# Patient Record
Sex: Female | Born: 1955
Health system: Southern US, Community
[De-identification: ages and names within clinical notes are randomized; demographics above are authoritative.]

## PROBLEM LIST (undated history)

## (undated) DIAGNOSIS — F609 Personality disorder, unspecified: Secondary | ICD-10-CM

## (undated) DIAGNOSIS — G43909 Migraine, unspecified, not intractable, without status migrainosus: Secondary | ICD-10-CM

## (undated) DIAGNOSIS — G5602 Carpal tunnel syndrome, left upper limb: Secondary | ICD-10-CM

## (undated) DIAGNOSIS — R0600 Dyspnea, unspecified: Secondary | ICD-10-CM

## (undated) DIAGNOSIS — G5622 Lesion of ulnar nerve, left upper limb: Secondary | ICD-10-CM

## (undated) DIAGNOSIS — K219 Gastro-esophageal reflux disease without esophagitis: Secondary | ICD-10-CM

## (undated) DIAGNOSIS — M1990 Unspecified osteoarthritis, unspecified site: Secondary | ICD-10-CM

## (undated) DIAGNOSIS — F909 Attention-deficit hyperactivity disorder, unspecified type: Secondary | ICD-10-CM

## (undated) DIAGNOSIS — Z9151 Personal history of suicidal behavior: Secondary | ICD-10-CM

## (undated) DIAGNOSIS — Z87442 Personal history of urinary calculi: Secondary | ICD-10-CM

## (undated) DIAGNOSIS — E538 Deficiency of other specified B group vitamins: Secondary | ICD-10-CM

## (undated) DIAGNOSIS — F419 Anxiety disorder, unspecified: Secondary | ICD-10-CM

## (undated) DIAGNOSIS — T4145XA Adverse effect of unspecified anesthetic, initial encounter: Secondary | ICD-10-CM

## (undated) DIAGNOSIS — F32A Depression, unspecified: Secondary | ICD-10-CM

## (undated) DIAGNOSIS — N390 Urinary tract infection, site not specified: Secondary | ICD-10-CM

## (undated) DIAGNOSIS — S42209A Unspecified fracture of upper end of unspecified humerus, initial encounter for closed fracture: Secondary | ICD-10-CM

## (undated) DIAGNOSIS — I959 Hypotension, unspecified: Secondary | ICD-10-CM

## (undated) DIAGNOSIS — F329 Major depressive disorder, single episode, unspecified: Secondary | ICD-10-CM

## (undated) DIAGNOSIS — G5793 Unspecified mononeuropathy of bilateral lower limbs: Secondary | ICD-10-CM

## (undated) DIAGNOSIS — Z915 Personal history of self-harm: Secondary | ICD-10-CM

## (undated) DIAGNOSIS — T8859XA Other complications of anesthesia, initial encounter: Secondary | ICD-10-CM

## (undated) DIAGNOSIS — M62421 Contracture of muscle, right upper arm: Secondary | ICD-10-CM

## (undated) HISTORY — PX: EYE SURGERY: SHX253

## (undated) HISTORY — PX: LUMBAR DISC SURGERY: SHX700

## (undated) HISTORY — PX: BACK SURGERY: SHX140

## (undated) HISTORY — PX: TONSILLECTOMY: SUR1361

## (undated) HISTORY — DX: Deficiency of other specified B group vitamins: E53.8

---

## 1987-12-25 HISTORY — PX: OTHER SURGICAL HISTORY: SHX169

## 1999-04-28 ENCOUNTER — Other Ambulatory Visit: Admission: RE | Admit: 1999-04-28 | Discharge: 1999-04-28 | Payer: Self-pay | Admitting: *Deleted

## 1999-06-08 ENCOUNTER — Encounter: Payer: Self-pay | Admitting: Orthopedic Surgery

## 1999-06-08 ENCOUNTER — Ambulatory Visit (HOSPITAL_COMMUNITY): Admission: RE | Admit: 1999-06-08 | Discharge: 1999-06-08 | Payer: Self-pay | Admitting: Orthopedic Surgery

## 1999-06-13 ENCOUNTER — Ambulatory Visit (HOSPITAL_BASED_OUTPATIENT_CLINIC_OR_DEPARTMENT_OTHER): Admission: RE | Admit: 1999-06-13 | Discharge: 1999-06-13 | Payer: Self-pay | Admitting: Orthopedic Surgery

## 1999-06-21 ENCOUNTER — Ambulatory Visit (HOSPITAL_COMMUNITY): Admission: RE | Admit: 1999-06-21 | Discharge: 1999-06-21 | Payer: Self-pay | Admitting: Orthopedic Surgery

## 1999-09-20 ENCOUNTER — Other Ambulatory Visit: Admission: RE | Admit: 1999-09-20 | Discharge: 1999-09-20 | Payer: Self-pay | Admitting: *Deleted

## 2000-02-05 ENCOUNTER — Ambulatory Visit (HOSPITAL_COMMUNITY): Admission: RE | Admit: 2000-02-05 | Discharge: 2000-02-05 | Payer: Self-pay | Admitting: Orthopedic Surgery

## 2000-02-05 ENCOUNTER — Encounter: Payer: Self-pay | Admitting: Orthopedic Surgery

## 2000-09-11 ENCOUNTER — Encounter: Admission: RE | Admit: 2000-09-11 | Discharge: 2000-09-11 | Payer: Self-pay | Admitting: *Deleted

## 2000-09-11 ENCOUNTER — Other Ambulatory Visit: Admission: RE | Admit: 2000-09-11 | Discharge: 2000-09-11 | Payer: Self-pay | Admitting: *Deleted

## 2000-09-11 ENCOUNTER — Encounter: Payer: Self-pay | Admitting: *Deleted

## 2000-10-01 ENCOUNTER — Other Ambulatory Visit: Admission: RE | Admit: 2000-10-01 | Discharge: 2000-10-01 | Payer: Self-pay | Admitting: *Deleted

## 2002-02-17 ENCOUNTER — Other Ambulatory Visit: Admission: RE | Admit: 2002-02-17 | Discharge: 2002-02-17 | Payer: Self-pay | Admitting: *Deleted

## 2002-10-28 ENCOUNTER — Encounter: Admission: RE | Admit: 2002-10-28 | Discharge: 2002-10-28 | Payer: Self-pay | Admitting: Family Medicine

## 2002-10-28 ENCOUNTER — Encounter: Payer: Self-pay | Admitting: Family Medicine

## 2004-04-10 ENCOUNTER — Other Ambulatory Visit: Admission: RE | Admit: 2004-04-10 | Discharge: 2004-04-10 | Payer: Self-pay | Admitting: Family Medicine

## 2004-04-25 ENCOUNTER — Encounter: Admission: RE | Admit: 2004-04-25 | Discharge: 2004-04-25 | Payer: Self-pay | Admitting: Family Medicine

## 2004-08-07 ENCOUNTER — Other Ambulatory Visit: Admission: RE | Admit: 2004-08-07 | Discharge: 2004-08-07 | Payer: Self-pay | Admitting: Family Medicine

## 2005-02-09 ENCOUNTER — Other Ambulatory Visit: Admission: RE | Admit: 2005-02-09 | Discharge: 2005-02-09 | Payer: Self-pay | Admitting: Family Medicine

## 2005-06-06 ENCOUNTER — Encounter: Admission: RE | Admit: 2005-06-06 | Discharge: 2005-06-06 | Payer: Self-pay | Admitting: Family Medicine

## 2005-08-08 ENCOUNTER — Other Ambulatory Visit: Admission: RE | Admit: 2005-08-08 | Discharge: 2005-08-08 | Payer: Self-pay | Admitting: Family Medicine

## 2006-08-05 ENCOUNTER — Other Ambulatory Visit: Admission: RE | Admit: 2006-08-05 | Discharge: 2006-08-05 | Payer: Self-pay | Admitting: Family Medicine

## 2006-09-09 ENCOUNTER — Encounter: Admission: RE | Admit: 2006-09-09 | Discharge: 2006-09-09 | Payer: Self-pay | Admitting: Family Medicine

## 2007-09-05 ENCOUNTER — Other Ambulatory Visit: Admission: RE | Admit: 2007-09-05 | Discharge: 2007-09-05 | Payer: Self-pay | Admitting: Family Medicine

## 2007-09-12 ENCOUNTER — Encounter: Admission: RE | Admit: 2007-09-12 | Discharge: 2007-09-12 | Payer: Self-pay | Admitting: Family Medicine

## 2008-03-12 ENCOUNTER — Other Ambulatory Visit: Admission: RE | Admit: 2008-03-12 | Discharge: 2008-03-12 | Payer: Self-pay | Admitting: Family Medicine

## 2008-09-30 ENCOUNTER — Other Ambulatory Visit: Admission: RE | Admit: 2008-09-30 | Discharge: 2008-09-30 | Payer: Self-pay | Admitting: Obstetrics and Gynecology

## 2008-10-06 ENCOUNTER — Encounter: Admission: RE | Admit: 2008-10-06 | Discharge: 2008-10-06 | Payer: Self-pay | Admitting: Obstetrics and Gynecology

## 2008-12-01 ENCOUNTER — Ambulatory Visit (HOSPITAL_COMMUNITY): Admission: RE | Admit: 2008-12-01 | Discharge: 2008-12-01 | Payer: Self-pay | Admitting: Obstetrics and Gynecology

## 2008-12-01 ENCOUNTER — Encounter (INDEPENDENT_AMBULATORY_CARE_PROVIDER_SITE_OTHER): Payer: Self-pay | Admitting: Obstetrics and Gynecology

## 2008-12-01 HISTORY — PX: CERVICAL BIOPSY  W/ LOOP ELECTRODE EXCISION: SUR135

## 2009-07-13 ENCOUNTER — Other Ambulatory Visit: Admission: RE | Admit: 2009-07-13 | Discharge: 2009-07-13 | Payer: Self-pay | Admitting: Obstetrics and Gynecology

## 2009-10-14 ENCOUNTER — Encounter: Admission: RE | Admit: 2009-10-14 | Discharge: 2009-10-14 | Payer: Self-pay | Admitting: Family Medicine

## 2009-10-31 ENCOUNTER — Ambulatory Visit (HOSPITAL_BASED_OUTPATIENT_CLINIC_OR_DEPARTMENT_OTHER): Admission: RE | Admit: 2009-10-31 | Discharge: 2009-10-31 | Payer: Self-pay | Admitting: General Surgery

## 2009-10-31 ENCOUNTER — Encounter (INDEPENDENT_AMBULATORY_CARE_PROVIDER_SITE_OTHER): Payer: Self-pay | Admitting: General Surgery

## 2009-10-31 HISTORY — PX: FEMORAL HERNIA REPAIR: SUR1179

## 2010-01-25 ENCOUNTER — Other Ambulatory Visit: Admission: RE | Admit: 2010-01-25 | Discharge: 2010-01-25 | Payer: Self-pay | Admitting: Obstetrics and Gynecology

## 2010-08-29 ENCOUNTER — Other Ambulatory Visit: Admission: RE | Admit: 2010-08-29 | Discharge: 2010-08-29 | Payer: Self-pay | Admitting: Obstetrics and Gynecology

## 2011-01-10 ENCOUNTER — Emergency Department (HOSPITAL_COMMUNITY)
Admission: EM | Admit: 2011-01-10 | Discharge: 2011-01-11 | Disposition: A | Discharge status: Other Acute Inpatient Hospital | Payer: Self-pay | Source: Home / Self Care | Admitting: Emergency Medicine

## 2011-01-11 ENCOUNTER — Inpatient Hospital Stay (HOSPITAL_COMMUNITY)
Admission: AD | Admit: 2011-01-11 | Discharge: 2011-01-16 | Payer: Self-pay | Attending: Psychiatry | Admitting: Psychiatry

## 2011-01-15 LAB — COMPREHENSIVE METABOLIC PANEL
AST: 21 U/L (ref 0–37)
Albumin: 4.4 g/dL (ref 3.5–5.2)
Alkaline Phosphatase: 99 U/L (ref 39–117)
Calcium: 9.5 mg/dL (ref 8.4–10.5)
Chloride: 110 mEq/L (ref 96–112)
GFR calc Af Amer: >60 mL/min (ref >60)
Potassium: 3.5 mEq/L (ref 3.5–5.1)
Total Bilirubin: 0.6 mg/dL (ref 0.3–1.2)
Total Protein: 7.5 g/dL (ref 6.0–8.3)

## 2011-01-15 LAB — CBC
Hemoglobin: 14.7 g/dL (ref 12.0–15.0)
MCH: 31.4 pg (ref 26.0–34.0)
RBC: 4.68 MIL/uL (ref 3.87–5.11)

## 2011-01-15 LAB — DIFFERENTIAL
Basophils Absolute: 0 10*3/uL (ref 0.0–0.1)
Basophils Relative: 1 % (ref 0–1)
Eosinophils Relative: 3 % (ref 0–5)
Lymphocytes Relative: 41 % (ref 12–46)
Lymphs Abs: 2.1 10*3/uL (ref 0.7–4.0)
Monocytes Absolute: 0.4 10*3/uL (ref 0.1–1.0)
Neutro Abs: 2.4 10*3/uL (ref 1.7–7.7)
Neutrophils Relative %: 48 % (ref 43–77)

## 2011-01-15 LAB — ETHANOL: Alcohol, Ethyl (B): <5 mg/dL (ref 0–10)

## 2011-01-15 LAB — RAPID URINE DRUG SCREEN, HOSP PERFORMED: Tetrahydrocannabinol: NOT DETECTED
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
## 2011-01-19 NOTE — Discharge Summary (Signed)
  [name], [name]:  [id number]  MEDICAL RECORD NO.:  [id number]          PATIENT TYPE:  IPS  LOCATION:  0400                          FACILITY:  [facility]  PHYSICIAN:  Anselm Jungling, MD  DATE OF BIRTH:  08-22-1956  DATE OF ADMISSION:  01/11/2011 DATE OF DISCHARGE:  01/16/2011                              DISCHARGE SUMMARY   IDENTIFYING DATA AND REASON FOR ADMISSION:  This was an inpatient psychiatric admission for [name], a 55 year old unmarried female who was admitted in the aftermath of an overdose.  Please refer to the admission note for further details pertaining to the symptoms, circumstances and history that led to her hospitalization.  She was given an initial Axis I diagnosis of schizoaffective disorder not otherwise specified.  MEDICAL AND LABORATORY:  The patient was medically treated for her overdose, and subsequently transferred from the medical setting, following psychiatric consultation in that setting.  She presented as a well-nourished, normally-developed adult female who was pleasant and cooperative.  There were no significant medical issues at time of her admission to the Inpatient Psychiatry Service.  She was continued on pravastatin 80 mg.  There were no other significant medical issues.  HOSPITAL COURSE:  The patient was admitted to the Adult Inpatient Psychiatric Service.  She participated in therapeutic groups and activities geared towards helping her acquire better coping skills, a better understanding of her underlying disorder and dynamics, and the development of a solid aftercare plan.  She was treated with a psychotropic regimen of low-dose Xanax, Neurontin, Seroquel and Topamax. She was an excellent participant.  She referred to her overdose as "a mistake", and "completely stupid and dumb."  She was on one-to-one staffing because of suicide risk during a portion of her stay, but this was able to be discontinued prior to  her discharge.  On the sixth hospital day, the patient appeared appropriate for discharge, had been absent suicidal ideation for several days, __________ pleasant, cooperative and appearing to be quite capable of following up at outpatient treatment, which she was in agreement.  She agreed to follow an aftercare plan.  AFTERCARE:  The patient was to follow up with Laurann Montana on January 27, at 9:00 a.m.  Also, she was referred to [facility], appointment time to be arranged at the time of this dictation.  DISCHARGE MEDICATIONS: 1. Xanax 0.5 mg t.i.d. 2. Neurontin 600 mg b.i.d. 3. Seroquel 200 mg q.a.m. and 400 mg q.h.s. 4. Topamax 250 mg q.h.s. 5. Pravastatin 80 mg daily.  DISCHARGE DIAGNOSES:  AXIS I: Schizoaffective disorder, not otherwise specified. AXIS II: Deferred. AXIS III: History of hyperlipidemia, __________ . AXIS IV: Stressors severe. AXIS V: Global Assessment of Functioning on discharge 50.     Anselm Jungling, MD     SPB/MEDQ  D:  01/16/2011  T:  01/16/2011  Job:  [id number]  Electronically Signed by Geralyn Flash MD on 01/17/2011 08:46:24 AM

## 2011-03-28 LAB — DIFFERENTIAL
Basophils Absolute: 0 10*3/uL (ref 0.0–0.1)
Basophils Relative: 1 % (ref 0–1)
Eosinophils Absolute: 0 10*3/uL (ref 0.0–0.7)
Lymphocytes Relative: 29 % (ref 12–46)
Monocytes Relative: 9 % (ref 3–12)

## 2011-03-28 LAB — CBC
RBC: 4.23 MIL/uL (ref 3.87–5.11)
RDW: 13 % (ref 11.5–15.5)

## 2011-03-28 LAB — BASIC METABOLIC PANEL
CO2: 23 mEq/L (ref 19–32)
Chloride: 110 mEq/L (ref 96–112)
Creatinine, Ser: 0.82 mg/dL (ref 0.4–1.2)
GFR calc Af Amer: >60 mL/min (ref >60)
GFR calc non Af Amer: >60 mL/min (ref >60)
Glucose, Bld: 94 mg/dL (ref 70–99)

## 2011-05-08 NOTE — H&P (Signed)
[name], NARDONE               ACCOUNT NO.:  [id number]   MEDICAL RECORD NO.:  [id number]          PATIENT TYPE:  AMB   LOCATION:  SDC                           [facility]ILITY:  [facility]   PHYSICIAN:  Charles A. Delcambre, MDDATE OF BIRTH:  28-Apr-1956   DATE OF ADMISSION:  DATE OF DISCHARGE:                              HISTORY & PHYSICAL   She is a 55 year old, gravida 1, para 0-0-1-0 with known high-grade  lesion of the cervix upon colposcopy-guided biopsy and to be now  admitted for LEEP therapy.  She has informed consent of failed to remove  the entire lesion, damage to the vaginal side walls, bowel and bladder,  bleeding, blood product risk including hepatitis and HIV exposure.   PAST MEDICAL HISTORY:  Depression, arthritis, back and hip pain,  bleeding, and abducting her hips.   PAST SURGICAL HISTORY:  Back surgery x3.   MEDICATIONS:  1. Imipramine 20 mg at bedtime.  2. Neurontin 300 mg at bedtime.  3. Topamax 50 mg at bedtime.  4. Zocor 10 mg at bedtime.  5. Gabapentin 300 mg at bedtime.  6. Xanax 0.25 mg a.c., sometimes 0.5 mg to use during the day.  7. Antidepressants.  Other than what she is on, seem to make her      stomach upset.  I do not know if this is true allergy.   SOCIAL HISTORY:  A 15-pack-year smoking, quit in 95.  No alcohol or drug  use.  She is in relationship with sexually active with one partner for 7  years.   FAMILY HISTORY:  Hypertension, diabetes, breast cancer, and stroke.  Mammograms has been done less than a year ago and was negative.  She  denies family history of uterus, cervix, colon cancer, lymphoma, and  coronary artery disease.   REVIEW OF SYSTEMS:  Occasional pain with intercourse, dryness, and  migraine headaches.  Denies fever or chills.  No new rashes or lesions.  No headaches or dizziness.  Some seasonal allergies.  No chest pain.  No  shortness of breath or wheezing.  No diarrhea, constipation, bleeding,  melena, or hematochezia.  No  urgency, frequency, dysuria, incontinence,  or hematuria.  No galactorrhea.  No emotional changes.   PHYSICAL EXAMINATION:  VITAL SIGNS:  Blood pressure 100/62, heart rate  is 72, respiration is 20, and afebrile.  HEART:  Regular rate and rhythm.  LUNGS:  Clear bilaterally.  ABDOMEN: Nontender.  PELVIC:  Normal external female genitalia.  Bartholin, urethral, and  Skene within normal limits.  Vulva without discharge or lesions.  Nulliparous appearance of cervix is noted.  Uterus nonenlarged.  Adnexa  nontender without masses bilaterally.  Ovaries nonpalpable bilaterally.   ASSESSMENT:  High-grade lesion of the cervix.   PLAN:  LEEP.  We will get this scheduled in the hospital secondary to  mobility, now good here in the office.  She would need sedation and  careful strips secondary to right hip arthritis.  The patient agrees and  accepts risk of infection, bleeding, bowel, and bladder damage,  otherwise as noted above.      Leonette Most  A. Sydnee Cabal, MD  Electronically Signed     CAD/MEDQ  D:  11/23/2008  T:  11/24/2008  Job:  [id number]

## 2011-05-08 NOTE — Op Note (Signed)
NAMEMALVINA, [name]               ACCOUNT NO.:  [id number]   MEDICAL RECORD NO.:  [id number]          PATIENT TYPE:  AMB   LOCATION:  SDC                           [facility]ILITY:  [facility]   PHYSICIAN:  Charles A. 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   PROCEDURE:  Loop electrosurgical excision procedure.   SURGEON:  Charles A. Delcambre, MD   ASSISTANT:  None.   COMPLICATIONS:  None.   ESTIMATED BLOOD LOSS:  Less than 5 mL.   OPERATIVE FINDINGS:  Wide lesion unstained with Lugol's consistent with  office colposcopy.   SPECIMEN:  Main:  Cone body with stitch at 12 o'clock 3 o'clock margin  at the cervix, 9 o'clock margin at the cervix, and deep the endocervix  margin were all sent separately.   COUNTS:  Correct x2.   DESCRIPTION OF PROCEDURE:  The patient was taken to the operating room,  placed in supine position, given general anesthesia by laryngeal mask.  She was placed in dorsal lithotomy position.  Colposcope was readied and  fixed.  Cervix was prepped with Lugol's to outline the lesion carefully,  was somewhat wide lesion.  LEEP with a pad with the patient having  grounding pad was then used on the cut current  40 and coag of 35 was  used after excising the piece of tissue with the ball electrode for  excellent hemostasis.  I would estimate that this is to have further  ablated approximately 2-3 mm out beyond the ectocervical edge of the  tissue.  There did appear to be some thermal injury on the cone specimen  from settings that hospital limits to 35 and 40 were I usually use 50  and  51.      Charles A. Sydnee Cabal, MD  Electronically Signed     CAD/MEDQ  D:  12/01/2008  T:  12/02/2008  Job:  [id number]

## 2011-09-05 ENCOUNTER — Inpatient Hospital Stay (HOSPITAL_COMMUNITY)
Admission: EM | Admit: 2011-09-05 | Discharge: 2011-09-10 | DRG: 918 | Disposition: A | Discharge status: Other Acute Inpatient Hospital | Payer: Self-pay | Attending: Internal Medicine | Admitting: Internal Medicine

## 2011-09-05 DIAGNOSIS — G43909 Migraine, unspecified, not intractable, without status migrainosus: Secondary | ICD-10-CM | POA: Diagnosis present

## 2011-09-05 DIAGNOSIS — F3130 Bipolar disorder, current episode depressed, mild or moderate severity, unspecified: Secondary | ICD-10-CM | POA: Diagnosis present

## 2011-09-05 DIAGNOSIS — F17200 Nicotine dependence, unspecified, uncomplicated: Secondary | ICD-10-CM | POA: Diagnosis present

## 2011-09-05 DIAGNOSIS — E876 Hypokalemia: Secondary | ICD-10-CM | POA: Diagnosis present

## 2011-09-05 DIAGNOSIS — T424X4A Poisoning by benzodiazepines, undetermined, initial encounter: Principal | ICD-10-CM | POA: Diagnosis present

## 2011-09-05 DIAGNOSIS — T424X1A Poisoning by benzodiazepines, accidental (unintentional), initial encounter: Secondary | ICD-10-CM | POA: Diagnosis present

## 2011-09-05 DIAGNOSIS — F411 Generalized anxiety disorder: Secondary | ICD-10-CM | POA: Diagnosis present

## 2011-09-05 DIAGNOSIS — R404 Transient alteration of awareness: Secondary | ICD-10-CM | POA: Diagnosis present

## 2011-09-05 LAB — COMPREHENSIVE METABOLIC PANEL
Albumin: 3.5 g/dL (ref 3.5–5.2)
CO2: 21 mEq/L (ref 19–32)
GFR calc non Af Amer: 59 mL/min — ABNORMAL LOW (ref >60)
Potassium: 3.4 mEq/L — ABNORMAL LOW (ref 3.5–5.1)
Sodium: 140 mEq/L (ref 135–145)

## 2011-09-05 LAB — DIFFERENTIAL
Basophils Absolute: 0 10*3/uL (ref 0.0–0.1)
Eosinophils Relative: 2 % (ref 0–5)
Lymphs Abs: 1.8 10*3/uL (ref 0.7–4.0)
Monocytes Absolute: 0.4 10*3/uL (ref 0.1–1.0)
Monocytes Relative: 10 % (ref 3–12)
Neutro Abs: 2.2 10*3/uL (ref 1.7–7.7)
Neutrophils Relative %: 49 % (ref 43–77)

## 2011-09-05 LAB — CBC
HCT: 36.2 % (ref 36.0–46.0)
MCH: 31.5 pg (ref 26.0–34.0)
MCHC: 33.4 g/dL (ref 30.0–36.0)
RBC: 3.84 MIL/uL — ABNORMAL LOW (ref 3.87–5.11)

## 2011-09-05 LAB — URINALYSIS, ROUTINE W REFLEX MICROSCOPIC
Glucose, UA: NEGATIVE mg/dL
Hgb urine dipstick: NEGATIVE
Ketones, ur: NEGATIVE mg/dL
Leukocytes, UA: NEGATIVE
Nitrite: NEGATIVE
Protein, ur: NEGATIVE mg/dL
Urobilinogen, UA: 0.2 mg/dL (ref 0.0–1.0)
pH: 5.5 (ref 5.0–8.0)

## 2011-09-05 LAB — MAGNESIUM: Magnesium: 2.1 mg/dL (ref 1.5–2.5)

## 2011-09-05 LAB — ACETAMINOPHEN LEVEL: Acetaminophen (Tylenol), Serum: <15 ug/mL (ref 10–30)

## 2011-09-05 LAB — RAPID URINE DRUG SCREEN, HOSP PERFORMED: Barbiturates: NOT DETECTED

## 2011-09-05 LAB — MRSA PCR SCREENING: MRSA by PCR: NEGATIVE

## 2011-09-07 DIAGNOSIS — F3189 Other bipolar disorder: Secondary | ICD-10-CM

## 2011-09-07 NOTE — H&P (Signed)
[name], IGE NO.:  [id number]  MEDICAL RECORD NO.:  [id number]  LOCATION:  WLED                         FACILITY:  [facility]  PHYSICIAN:  Marinda Elk, M.D.DATE OF BIRTH:  06-16-1956  DATE OF ADMISSION:  09/05/2011 DATE OF DISCHARGE:                             HISTORY & PHYSICAL   PRIMARY CARE DOCTOR:  None.  PSYCHIATRIST:  Eulogio Ditch, MD, and Anselm Jungling, MD  This is a 55 year old with past medical history of schizoaffective disorder and bipolar disorder who has tried to commit suicide in the past, admitted to [facility] on January 04, 2011, for manic episodes, who comes in for an overdose.  The patient is lethargic, only withdrawing from pain, not able to give a history.  But as per ED records and EMS record, she called her caseworker and told her she could not take it anymore, so EMS was called by the caseworker.  When they arrived, the patient had taken an unknown amount of Xanax, the bottle was empty.  Per EMS report, the bottle was for 90 and they brought the patient here to the ED.  Here in the ED, she is not able to give a history, she is saturating 99% on 2 L, breathing about 8-12 times per minute, in no acute distress.  So, we were asked to admit and further evaluate as the patient has been here for more than 16 hours.  MEDICATIONS:  Unable to obtain.  PAST MEDICAL HISTORY:  As mentioned above for, 1. Bipolar disorder II. 2. Migraines. 3. Chronic pain.  SOCIAL HISTORY:  She continues to smoke as per records.  There is no history of alcohol abuse.  She was in a relationship with a partner.  FAMILY HISTORY:  As per records.  There is a family history of diabetes and hypertension in her mother and father's sides.  REVIEW OF SYSTEMS:  10-point review of systems done, pertinent positives per HPI.  PHYSICAL EXAMINATION:  VITAL SIGNS:  Temperature 97.9, pulse 72, blood pressure 97/67, she is saturating 100% on 2  L, breathing 8-10 times per minute.  GENERAL:  She is lethargic, only withdrawing from pain. HEENT:  She does not want to open her mouth with clenched teeth, but good oral hygiene. NECK:  She has no JVD. LUNGS:  Good air movement, clear to auscultation. CARDIOVASCULAR WISE:  She is bradycardic with a positive S1 and S2.  No murmurs, rubs, or gallops. ABDOMEN:  Positive bowel sounds, nontender, nondistended. EXTREMITIES:  She has a tattoo on her left ankle.  No edema. SKIN:  She has a tattoo on her left ankle and her back.  She has no ulcerations or rashes. NEURO:  The patient is lethargic.  IMAGING:  None.  LABORATORY DATA:  On admission shows a white count of 4.4, hemoglobin of 12, platelet count 158, ANC of 2.2.  Her Tylenol level was less than 15. Her sodium was 140, potassium 3.4, chloride 109, bicarbonate of 21, glucose of 83, BUN of 15, creatinine 0.9.  LFTs within normal limits. Her alcohol level is less than 11.  Her aspirin level is less than 2. Her UDS is positive for benzos and her  UA shows no signs of infection.  IMAGING:  None.  An EKG is pending at the time of the dictation.  ASSESSMENT AND PLAN: 1. Intentional overdose, most likely secondary to benzodiazepines.     The patient is lethargic.  We will admit her to step-down unit to     monitor her saturations and her vitals.  I do not think she is able     to protect her airway at this time, but she is in no distress.  She     is saturating 100% on 2 L and breathing 8-10 times per minute.  I     will also check an EKG.  She do not have medication list that we     have here in the hospital.  She is not in TCA, but we will check an     EKG just in case.  At this time, she is bradycardic at 55-60.  Her     other levels for toxic drugs have been negative aspirin, alcohol,     and Tylenol.  So, we will ask Pharmacy to check if there is any     other medications that she has gotten from her pharmacy that she     might have  ingested.  Also call Psychiatry in the morning once the     patient is more wake for suicidal attempt.  At this time, her     vitals are stable.  We will admit her to step-down. 2. Hypokalemia.  We will replete and we will check a mag.     Marinda Elk, M.D.     AF/MEDQ  D:  09/05/2011  T:  09/05/2011  Job:  [id number]  Electronically Signed by Marinda Elk M.D. on 09/07/2011 05:39:54 PM

## 2011-09-08 NOTE — Discharge Summary (Unsigned)
[name], [name] NO.:  [id number]  MEDICAL RECORD NO.:  [id number]  LOCATION:  1510                         FACILITY:  [facility]  PHYSICIAN:  Zannie Cove, MD     DATE OF BIRTH:  Jun 14, 1956  DATE OF ADMISSION:  09/05/2011 DATE OF DISCHARGE:  09/07/2011                         DISCHARGE SUMMARY-REFERRING   PRIMARY CARE PHYSICIAN:  None.  DISCHARGE DIAGNOSES: 1. Xanax overdose. 2. Suicide attempt. 3. History of schizoaffective disorder. 4. Depression. 5. Dyslipidemia. 6. Chronic pain. 7. History of migraines.  DISCHARGE MEDICATIONS: 1. Haldol 2 mg p.o. b.i.d. 2. Xanax 0.5 mg p.o. t.i.d. p.r.n. 3. Haloperidol 5 mg IM q.6 h. p.r.n. for agitation or restlessness.  CONSULTANTS:  Dr. Kathryne Sharper with Psychiatry.  HOSPITAL COURSE:  [name] is a 55 year old female with history of depression and prior suicide attempt who was admitted to the hospital in a lethargic condition after being found by EMS.  Apparently, the patient called her case worker and told her she could not take it anymore. Subsequently, the case worker called the EMS and when EMS arrived, they found the patient to be extremely lethargic with an empty bottle of Xanax.  Unclear as to how may tablets she took.  She was obtunded for the first 24 hours of her hospital stay and hence admitted to the Medical Service.  Subsequently, the rest of her blood work and all that was unremarkable and when the medication effects started wearing off, she became arousable and alert and awake.  She admitted for the suicidal attempt and did report to the psychiatrist that if she goes home that she may try to kill herself again.  However, at this point, wants to go home and said that she will not kill herself, however, she is being transferred to [facility] for further psychiatric care at the Psych Unit since she is medically cleared per Psych recommendations.  DISCHARGE CONDITION:   Stable.  VITAL SIGNS:  Temperature is 98, pulse 83, blood pressure 102/68, respirations 14, satting 100% room air.  DISCHARGE FOLLOWUP:  With psychiatrist as needed.     Zannie Cove, MD     PJ/MEDQ  D:  2020-09-1611  T:  2020-09-1611  Job:  [id number]
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
## 2011-09-10 ENCOUNTER — Inpatient Hospital Stay (HOSPITAL_COMMUNITY)
Admission: EM | Admit: 2011-09-10 | Discharge: 2011-09-13 | DRG: 885 | Disposition: A | Discharge status: Home / Self Care | Payer: PRIVATE HEALTH INSURANCE | Attending: Psychiatry | Admitting: Psychiatry

## 2011-09-10 DIAGNOSIS — G43909 Migraine, unspecified, not intractable, without status migrainosus: Secondary | ICD-10-CM

## 2011-09-10 DIAGNOSIS — M549 Dorsalgia, unspecified: Secondary | ICD-10-CM

## 2011-09-10 DIAGNOSIS — T43502A Poisoning by unspecified antipsychotics and neuroleptics, intentional self-harm, initial encounter: Secondary | ICD-10-CM

## 2011-09-10 DIAGNOSIS — G8929 Other chronic pain: Secondary | ICD-10-CM

## 2011-09-10 DIAGNOSIS — E785 Hyperlipidemia, unspecified: Secondary | ICD-10-CM

## 2011-09-10 DIAGNOSIS — F339 Major depressive disorder, recurrent, unspecified: Principal | ICD-10-CM

## 2011-09-10 DIAGNOSIS — T438X2A Poisoning by other psychotropic drugs, intentional self-harm, initial encounter: Secondary | ICD-10-CM

## 2011-09-10 DIAGNOSIS — Z79899 Other long term (current) drug therapy: Secondary | ICD-10-CM

## 2011-09-10 DIAGNOSIS — T424X4A Poisoning by benzodiazepines, undetermined, initial encounter: Secondary | ICD-10-CM

## 2011-09-10 DIAGNOSIS — F411 Generalized anxiety disorder: Secondary | ICD-10-CM

## 2011-09-11 DIAGNOSIS — F411 Generalized anxiety disorder: Secondary | ICD-10-CM

## 2011-09-11 DIAGNOSIS — F339 Major depressive disorder, recurrent, unspecified: Secondary | ICD-10-CM

## 2011-09-17 NOTE — Discharge Summary (Addendum)
  [name], [name]:  [id number]  MEDICAL RECORD NO.:  [id number]  LOCATION:  0505                          FACILITY:  [facility]  PHYSICIAN:  Franchot Gallo, MD     DATE OF BIRTH:  [birth year]  DATE OF ADMISSION:  09/10/2011 DATE OF DISCHARGE:  09/13/2011                              DISCHARGE SUMMARY   REASON FOR ADMISSION:  This is a 55 year old female that was a transfer from the medical floor after the patient had a suicide attempt with an overdose on her medications.  She overdosed on approximately 90 Xanax tablets.  PERTINENT LABS:  The patient had a white count of 4.40, hemoglobin of 12.  LFTs were within normal limits.  Her urine drug screen is positive for benzodiazepines.  FINAL IMPRESSION:  Axis I: 1. Major depressive disorder recurrent, moderate. 2. Generalized anxiety disorder. Axis II:  Deferred. Axis III:  History of dyslipidemia, migraine headaches, chronic back pain with a history of previous surgery. Axis IV:  Chronic health issues. Axis V:  GAF on discharge 70.  SIGNIFICANT FINDINGS:  The patient was admitted to the adult milieu for safety and stabilization.  She participated in the discharge planning group.  She denied any suicidal thoughts.  The patient was reporting having trouble getting to sleep, but stated that it was the environment. She was reporting a good appetite.  Her depression had resolved rating it a zero on a scale of 1-10.  She denied any suicidal or homicidal thoughts or psychotic symptoms, reporting her anxiety a 2 on a scale of 1-10.  Hopelessness was a zero on a scale of 1-10.  We had Klonopin available for her on a b.i.d. basis.  DISCHARGE MEDICATIONS:  Her discharge medications were: 1. Klonopin 0.5 mg 1 b.i.d. p.r.n. for anxiety. 2. Haldol 2 mg nightly. 3. Gabapentin 300 mg 1 t.i.d. 4. Methoxydone 200 mg taking two daily. 5. Topamax 200 mg 1 nightly and Topamax 50 mg 1 nightly for a total of     250 mg. 6.  Voltaren gel as needed. 7. The patient was to stop taking her Xanax.  FOLLOWUP:  Her follow-up appointment was with Dr. Evelene Croon phone number [phone] on Friday September 28th at 11:45 and with Ophelia Shoulder at the [facility] on Thursday September 27th at 10:00 a.m.     Landry Corporal, N.P.   ______________________________ Franchot Gallo, MD    JO/MEDQ  D:  09/13/2011  T:  09/13/2011  Job:  [id number]  Electronically Signed by Limmie PatriciaP. on 09/24/2011 09:47:12 AM Electronically Signed by Franchot Gallo MD on 09/24/2011 12:21:25 PM

## 2011-09-28 LAB — COMPREHENSIVE METABOLIC PANEL
ALT: 19 U/L (ref 0–35)
AST: 21 U/L (ref 0–37)
Albumin: 4.1 g/dL (ref 3.5–5.2)
Alkaline Phosphatase: 100 U/L (ref 39–117)
BUN: 14 mg/dL (ref 6–23)
Chloride: 115 mEq/L — ABNORMAL HIGH (ref 96–112)
Potassium: 4.2 mEq/L (ref 3.5–5.1)
Sodium: 143 mEq/L (ref 135–145)
Total Bilirubin: 0.5 mg/dL (ref 0.3–1.2)
Total Protein: 6.8 g/dL (ref 6.0–8.3)

## 2011-09-28 LAB — CBC
HCT: 41.2 % (ref 36.0–46.0)
Platelets: 185 10*3/uL (ref 150–400)
WBC: 5 10*3/uL (ref 4.0–10.5)

## 2011-10-04 NOTE — Discharge Summary (Signed)
  NAMEADRIAUNA, [name]:  [id number]  MEDICAL RECORD NO.:  [id number]  LOCATION:  1510                         FACILITY:  [facility]  PHYSICIAN:  Zannie Cove, MD     DATE OF BIRTH:  05-26-1956  DATE OF ADMISSION:  09/05/2011 DATE OF DISCHARGE:  09/07/2011                        DISCHARGE SUMMARY - REFERRING   ADDENDUM:  DISCHARGE MEDICATIONS: 1. Haldol 2 mg p.o. b.i.d. 2. Xanax 0.5 mg p.o. t.i.d. p.r.n. 3. Haloperidol 5 mg IM q.6 h. p.r.n. for agitation and restlessness. 4. Neurontin 300 mg p.o. t.i.d. 5. Topamax 50 mg p.o. q.h.s.     Zannie Cove, MD     PJ/MEDQ  D:  09/09/2011  T:  09/09/2011  Job:  [id number]  Electronically Signed by Zannie Cove  on 10/04/2011 02:25:54 PM

## 2011-10-05 ENCOUNTER — Other Ambulatory Visit: Payer: Self-pay | Admitting: Obstetrics and Gynecology

## 2011-10-05 ENCOUNTER — Other Ambulatory Visit (HOSPITAL_COMMUNITY)
Admission: RE | Admit: 2011-10-05 | Discharge: 2011-10-05 | Disposition: A | Discharge status: Home / Self Care | Payer: PRIVATE HEALTH INSURANCE | Source: Ambulatory Visit | Attending: Obstetrics and Gynecology | Admitting: Obstetrics and Gynecology

## 2011-10-05 DIAGNOSIS — Z01419 Encounter for gynecological examination (general) (routine) without abnormal findings: Secondary | ICD-10-CM | POA: Insufficient documentation

## 2011-10-19 NOTE — Assessment & Plan Note (Signed)
  [name], [name]:  [id number]  MEDICAL RECORD NO.:  [id number]  LOCATION:  0505                          [facility]ACILITY[facility]  [facility]  PHYSICIAN:  Franchot Gallo, MD     DATE OF BIRTH:  12/03/1956  DATE OF ADMISSION:  09/10/2011 DATE OF DISCHARGE:  09/13/2011                      PSYCHIATRIC ADMISSION ASSESSMENT   IDENTIFYING INFORMATION:  This is a 55 year old female.  This is a voluntary admission.  HISTORY OF PRESENT ILLNESS:  [name] is transferred from our medical unit after she overdosed on her medications, taking approximately 90 tablets of alprazolam.  She was medically cleared.  Please see the discharge dictation from our medical unit.  PAST PSYCHIATRIC HISTORY:  Currently followed as an outpatient by Dr. Milagros Evener for mood problems.  She had started her on a low dose of Saphris for mood stability,  but she had never actually taken it and did not fill the prescription.SOCIAL HISTORY:  [age]-year-old female here as a voluntary admission.  No legal problems.  Has a supportive family.  MEDICAL EVALUATION:  She was medically evaluated on our medical unit. Please see the discharge summary.  ADMITTING MENTAL STATUS EXAM:  Fully alert female, pleasant, cooperative, complaining of decreased sleep, overdosed 7 days ago.  She scores her depression a 4 on a 1-10 scale, if 10 is the worst.  Denying any suicidal or homicidal thoughts.  No auditory or visual hallucinations.  No evidence of delusional thinking.  DIAGNOSIS:  Axis I:  Major depressive disorder, recurrent, moderate. Generalized anxiety disorder.  PLAN:  The plan is to voluntarily admit her to our stabilization unit. We are going to start her on a low-dose of Haldol for mood stability, and we will continue her alprazolam 0.5 mg per mouth twice a day for 3 days, and continue to taper that off.     Margaret A. Lorin Picket, N.P.   ______________________________ Franchot Gallo, MD    MAS/MEDQ   D:  10/18/2011  T:  10/18/2011  Job:  [id number]  Electronically Signed by Kari Baars N.P. on 10/19/2011 08:24:43 AM Electronically Signed by Franchot Gallo MD on 10/19/2011 02:56:26 PM

## 2011-10-23 ENCOUNTER — Other Ambulatory Visit (HOSPITAL_COMMUNITY): Payer: Self-pay | Admitting: Obstetrics and Gynecology

## 2011-10-23 DIAGNOSIS — Z1231 Encounter for screening mammogram for malignant neoplasm of breast: Secondary | ICD-10-CM

## 2011-10-25 ENCOUNTER — Ambulatory Visit (HOSPITAL_COMMUNITY)
Admission: RE | Admit: 2011-10-25 | Discharge: 2011-10-25 | Disposition: A | Discharge status: Home / Self Care | Payer: Self-pay | Source: Ambulatory Visit | Attending: Obstetrics and Gynecology | Admitting: Obstetrics and Gynecology

## 2011-10-25 DIAGNOSIS — Z1231 Encounter for screening mammogram for malignant neoplasm of breast: Secondary | ICD-10-CM

## 2012-09-10 ENCOUNTER — Other Ambulatory Visit (HOSPITAL_COMMUNITY)
Admission: RE | Admit: 2012-09-10 | Discharge: 2012-09-10 | Disposition: A | Discharge status: Home / Self Care | Payer: PRIVATE HEALTH INSURANCE | Source: Ambulatory Visit | Attending: Obstetrics and Gynecology | Admitting: Obstetrics and Gynecology

## 2012-09-10 ENCOUNTER — Other Ambulatory Visit: Payer: Self-pay | Admitting: Obstetrics and Gynecology

## 2012-09-10 DIAGNOSIS — Z01419 Encounter for gynecological examination (general) (routine) without abnormal findings: Secondary | ICD-10-CM | POA: Insufficient documentation

## 2012-09-26 ENCOUNTER — Other Ambulatory Visit (HOSPITAL_COMMUNITY): Payer: Self-pay | Admitting: Obstetrics and Gynecology

## 2012-09-26 DIAGNOSIS — Z1231 Encounter for screening mammogram for malignant neoplasm of breast: Secondary | ICD-10-CM

## 2012-10-27 ENCOUNTER — Ambulatory Visit (HOSPITAL_COMMUNITY): Payer: PRIVATE HEALTH INSURANCE

## 2012-10-27 ENCOUNTER — Ambulatory Visit (HOSPITAL_COMMUNITY)
Admission: RE | Admit: 2012-10-27 | Discharge: 2012-10-27 | Disposition: A | Discharge status: Home / Self Care | Payer: Self-pay | Source: Ambulatory Visit | Attending: Obstetrics and Gynecology | Admitting: Obstetrics and Gynecology

## 2012-10-27 DIAGNOSIS — Z1231 Encounter for screening mammogram for malignant neoplasm of breast: Secondary | ICD-10-CM

## 2013-04-22 ENCOUNTER — Other Ambulatory Visit: Payer: Self-pay | Admitting: Orthopedic Surgery

## 2013-04-23 NOTE — H&P (Signed)
[name] is an 57 y.o. female.   CC / Reason for Visit: Bilateral hand numbness and tingling, bilateral shoulder pain HPI: This patient returns for reevaluation, symptomatically unchanged.  She is having more back pain after having fallen Saturday twice.  Due to her history of migraine she reports that she has used some tramadol around the time of surgery in the past.  NCS/EMG performed by Dr. 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 Presenting history follows: This patient is a 57 year old female who presents for evaluation mainly of numbness and tingling that involves both hands.  She reports having had nerve studies years ago.  Most recently she is been bothered mostly by the ring and small fingers, but has had numbness and tingling in the other digits for 5 or more years and only about a year for the ring and small fingers.  In addition she's had some recent pain and stiffness in both shoulders.  She does not take nonsteroidals because of a history of migraine headaches and some degree of rebound effect from those.  No past medical history on file.  No past surgical history on file.  No family history on file. Social History:  has no tobacco, alcohol, and drug history on file.  Allergies: Allergies not on file  No prescriptions prior to admission    No results found for this or any previous visit (from the past 48 hour(s)). No results found.  Review of Systems  All other systems reviewed and are negative.    There were no vitals taken for this visit. Physical Exam   Constitutional:  WD, WN, NAD HEENT:  NCAT, EOMI Neuro/Psych:  Alert & oriented to person, place, and time; appropriate mood & affect Lymphatic: No generalized UE edema or lymphadenopathy Extremities / MSK:  Both UE are normal with respect to appearance, ranges of motion, joint stability, muscle  strength/tone, sensation, & perfusion except as otherwise noted:  Elbow flexion testing causes symptoms of paresthesias into the index long and ring fingers bilaterally.  She can detect a 3.6 1 monofilament on both sides in the median distribution, 4.31 in the ulnar.  Bilaterally she has median symptoms with carpal compression testing and a Tinel sign over the median nerve proximal carpal tunnel.  Shoulder motion is reasonably good with some subjective stiffness.  Labs / Xrays: No radiographic studies obtained today.  Assessment:  Bilateral cubital and carpal tunnel syndrome, confirmed electrodiagnostic that.  Plan:  I discussed these findings with her.  I have recommended an endoscopic carpal tunnel release and ulnar neuroplasty at the elbow, hopefully decompression in situ if the nerve remained stable following release.  She would like for this to be performed as soon as feasible, likely the week of the 11th.  She would like this to be done as the first case of the day.  We ultimately decided that we will proceed first on the left side.  The details of the operative procedure were discussed with the patient.  Questions were invited and answered.  In addition to the goal of the procedure, the risks of the procedure to include but not limited to bleeding; infection; damage to the nerves or blood vessels that could result in bleeding, numbness, weakness, chronic pain, and the need for additional procedures; stiffness; the need for revision surgery; and anesthetic risks, the worst of which is death, were reviewed.  No specific outcome was  guaranteed or implied.  Informed consent was obtained.    Danise Dehne A. 04/23/2013, 3:08 PM

## 2013-05-04 ENCOUNTER — Encounter (HOSPITAL_BASED_OUTPATIENT_CLINIC_OR_DEPARTMENT_OTHER): Admission: RE | Payer: Self-pay | Source: Ambulatory Visit

## 2013-05-04 ENCOUNTER — Ambulatory Visit (HOSPITAL_BASED_OUTPATIENT_CLINIC_OR_DEPARTMENT_OTHER)
Admission: RE | Admit: 2013-05-04 | Payer: PRIVATE HEALTH INSURANCE | Source: Ambulatory Visit | Admitting: Orthopedic Surgery

## 2013-05-04 SURGERY — RELEASE, CARPAL TUNNEL, ENDOSCOPIC
Anesthesia: General | Laterality: Left

## 2013-05-08 ENCOUNTER — Encounter (HOSPITAL_BASED_OUTPATIENT_CLINIC_OR_DEPARTMENT_OTHER): Payer: Self-pay | Admitting: *Deleted

## 2013-05-08 NOTE — Progress Notes (Signed)
NPO AFTER MN. ARRIVES AT 0745. NEEDS HG. WILL TAKE GABAPENTIN, KLONOPIN, TOPAMAX AM OF SURG W/ SIPS OF WATER.

## 2013-05-12 NOTE — H&P (Signed)
[name] is an 57 y.o. female.   CC / Reason for Visit: Bilateral hand numbness and tingling, bilateral shoulder pain HPI: This patient returns for reevaluation, symptomatically unchanged.  She is having more back pain after having fallen Saturday twice.  Due to her history of migraine she reports that she has used some tramadol around the time of surgery in the past.  NCS/EMG performed by Dr. 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 Presenting history follows: This patient is a 57 year old female who presents for evaluation mainly of numbness and tingling that involves both hands.  She reports having had nerve studies years ago.  Most recently she is been bothered mostly by the ring and small fingers, but has had numbness and tingling in the other digits for 5 or more years and only about a year for the ring and small fingers.  In addition she's had some recent pain and stiffness in both shoulders.  She does not take nonsteroidals because of a history of migraine headaches and some degree of rebound effect from those.  Past Medical History  Diagnosis Date  . History of suicide attempt 2012--  XANAX OVERDOSE  . Schizoaffective disorder   . Migraine   . Depression   . Carpal tunnel syndrome of left wrist   . Cubital tunnel syndrome on left   . Anxiety     Past Surgical History  Procedure Laterality Date  . Cervical biopsy  w/ loop electrode excision  12-01-2008    CIN II  . Femoral hernia repair Left 10-31-2009    AND INGUINAL LYMPH NODE BX  . Lumbar disc surgery  X3   LAST ONE 1989    FUSION  . Repair partial finger amputation  1989    History reviewed. No pertinent family history. Social History:  does not have a smoking history on file. She has never used smokeless tobacco. She reports that she does not drink alcohol or use illicit drugs.  Allergies: No Known  Allergies  No prescriptions prior to admission    No results found for this or any previous visit (from the past 48 hour(s)). No results found.  Review of Systems  All other systems reviewed and are negative.    There were no vitals taken for this visit. Physical Exam  Constitutional:  WD, WN, NAD HEENT:  NCAT, EOMI Neuro/Psych:  Alert & oriented to person, place, and time; appropriate mood & affect Lymphatic: No generalized UE edema or lymphadenopathy Extremities / MSK:  Both UE are normal with respect to appearance, ranges of motion, joint stability, muscle strength/tone, sensation, & perfusion except as otherwise noted:  Elbow flexion testing causes symptoms of paresthesias into the index long and ring fingers bilaterally.  She can detect a 3.6 1 monofilament on both sides in the median distribution, 4.31 in the ulnar.  Bilaterally she has median symptoms with carpal compression testing and a Tinel sign over the median nerve proximal carpal tunnel.  Shoulder motion is reasonably good with some subjective stiffness.  Labs / Xrays: No radiographic studies obtained today.  Assessment:  Bilateral cubital and carpal tunnel syndrome, confirmed electrodiagnosticly.  Plan:  I discussed these findings with her.  I have recommended an endoscopic carpal tunnel release and ulnar neuroplasty at the elbow, hopefully decompression in situ if the nerve remained stable following release.  She would like for this to be performed as soon as  feasible, likely the week of the 11th.  She would like this to be done as the first case of the day.  We ultimately decided that we will proceed first on the left side.  The details of the operative procedure were discussed with the patient.  Questions were invited and answered.  In addition to the goal of the procedure, the risks of the procedure to include but not limited to bleeding; infection; damage to the nerves or blood vessels that could result in bleeding,  numbness, weakness, chronic pain, and the need for additional procedures; stiffness; the need for revision surgery; and anesthetic risks, the worst of which is death, were reviewed.  No specific outcome was guaranteed or implied.  Informed consent was obtained.    Olanna Percifield A. 05/12/2013, 4:38 PM

## 2013-05-13 ENCOUNTER — Ambulatory Visit (HOSPITAL_BASED_OUTPATIENT_CLINIC_OR_DEPARTMENT_OTHER)
Admission: RE | Admit: 2013-05-13 | Discharge: 2013-05-13 | Disposition: A | Discharge status: Home / Self Care | Payer: BC Managed Care – PPO | Source: Ambulatory Visit | Attending: Orthopedic Surgery | Admitting: Orthopedic Surgery

## 2013-05-13 ENCOUNTER — Ambulatory Visit (HOSPITAL_BASED_OUTPATIENT_CLINIC_OR_DEPARTMENT_OTHER): Payer: BC Managed Care – PPO | Admitting: Anesthesiology

## 2013-05-13 ENCOUNTER — Encounter (HOSPITAL_BASED_OUTPATIENT_CLINIC_OR_DEPARTMENT_OTHER): Payer: Self-pay | Admitting: Anesthesiology

## 2013-05-13 ENCOUNTER — Encounter (HOSPITAL_BASED_OUTPATIENT_CLINIC_OR_DEPARTMENT_OTHER)
Admission: RE | Disposition: A | Discharge status: Home / Self Care | Payer: Self-pay | Source: Ambulatory Visit | Attending: Orthopedic Surgery

## 2013-05-13 DIAGNOSIS — G5600 Carpal tunnel syndrome, unspecified upper limb: Secondary | ICD-10-CM | POA: Insufficient documentation

## 2013-05-13 DIAGNOSIS — G5620 Lesion of ulnar nerve, unspecified upper limb: Secondary | ICD-10-CM | POA: Insufficient documentation

## 2013-05-13 HISTORY — DX: Personal history of suicidal behavior: Z91.51

## 2013-05-13 HISTORY — DX: Personal history of self-harm: Z91.5

## 2013-05-13 HISTORY — PX: NERVE REPAIR: SHX2083

## 2013-05-13 HISTORY — PX: CARPAL TUNNEL RELEASE: SHX101

## 2013-05-13 HISTORY — DX: Lesion of ulnar nerve, left upper limb: G56.22

## 2013-05-13 HISTORY — DX: Depression, unspecified: F32.A

## 2013-05-13 HISTORY — DX: Major depressive disorder, single episode, unspecified: F32.9

## 2013-05-13 HISTORY — DX: Carpal tunnel syndrome, left upper limb: G56.02

## 2013-05-13 HISTORY — DX: Anxiety disorder, unspecified: F41.9

## 2013-05-13 HISTORY — DX: Migraine, unspecified, not intractable, without status migrainosus: G43.909

## 2013-05-13 SURGERY — RELEASE, CARPAL TUNNEL, ENDOSCOPIC
Anesthesia: General | Site: Hand | Laterality: Left | Wound class: Clean

## 2013-05-13 MED ORDER — ONDANSETRON HCL 4 MG/2ML IJ SOLN
INTRAMUSCULAR | Status: DC | PRN
  Administered 2013-05-13: 4 mg via INTRAVENOUS

## 2013-05-13 MED ORDER — TERCONAZOLE 0.4 % VA CREA: 1.0000 | TOPICAL_CREAM | Freq: Every day | VAGINAL | Status: DC

## 2013-05-13 MED ORDER — PROPOFOL 10 MG/ML IV BOLUS
INTRAVENOUS | Status: DC | PRN
  Administered 2013-05-13: 200 mg via INTRAVENOUS

## 2013-05-13 MED ORDER — HYDROCODONE-ACETAMINOPHEN 5-325 MG PO TABS: 1.0000 | ORAL_TABLET | Freq: Four times a day (QID) | ORAL | Status: DC | PRN

## 2013-05-13 MED ORDER — EPHEDRINE SULFATE 50 MG/ML IJ SOLN
INTRAMUSCULAR | Status: DC | PRN
  Administered 2013-05-13: 10 mg via INTRAVENOUS
  Administered 2013-05-13: 5 mg via INTRAVENOUS

## 2013-05-13 MED ORDER — LIDOCAINE HCL (CARDIAC) 20 MG/ML IV SOLN
INTRAVENOUS | Status: DC | PRN
  Administered 2013-05-13: 50 mg via INTRAVENOUS

## 2013-05-13 MED ORDER — BUPIVACAINE-EPINEPHRINE 0.5% -1:200000 IJ SOLN
INTRAMUSCULAR | Status: DC | PRN
  Administered 2013-05-13: 15 mL

## 2013-05-13 MED ORDER — FENTANYL CITRATE 0.05 MG/ML IJ SOLN
25.0000 ug | INTRAMUSCULAR | Status: DC | PRN
  Filled 2013-05-13: qty 1

## 2013-05-13 MED ORDER — HYDROCODONE-ACETAMINOPHEN 5-325 MG PO TABS
1.0000 | ORAL_TABLET | ORAL | Status: DC | PRN
  Filled 2013-05-13: qty 2

## 2013-05-13 MED ORDER — CEFAZOLIN SODIUM-DEXTROSE 2-3 GM-% IV SOLR
2.0000 g | INTRAVENOUS | Status: AC
  Administered 2013-05-13: 2 g via INTRAVENOUS
  Filled 2013-05-13: qty 50

## 2013-05-13 MED ORDER — FENTANYL CITRATE 0.05 MG/ML IJ SOLN
INTRAMUSCULAR | Status: DC | PRN
  Administered 2013-05-13 (×6): 25 ug via INTRAVENOUS
  Administered 2013-05-13: 50 ug via INTRAVENOUS
  Administered 2013-05-13: 25 ug via INTRAVENOUS

## 2013-05-13 MED ORDER — OXYCODONE-ACETAMINOPHEN 5-325 MG PO TABS
1.0000 | ORAL_TABLET | ORAL | Status: DC | PRN
  Filled 2013-05-13: qty 2

## 2013-05-13 MED ORDER — PROMETHAZINE HCL 25 MG/ML IJ SOLN
6.2500 mg | INTRAMUSCULAR | Status: DC | PRN
  Filled 2013-05-13: qty 1

## 2013-05-13 MED ORDER — LACTATED RINGERS IV SOLN
INTRAVENOUS | Status: DC
  Administered 2013-05-13 (×3): via INTRAVENOUS
  Filled 2013-05-13: qty 1000

## 2013-05-13 MED ORDER — DEXAMETHASONE SODIUM PHOSPHATE 4 MG/ML IJ SOLN
INTRAMUSCULAR | Status: DC | PRN
  Administered 2013-05-13: 8 mg via INTRAVENOUS

## 2013-05-13 MED ORDER — 0.9 % SODIUM CHLORIDE (POUR BTL) OPTIME
TOPICAL | Status: DC | PRN
  Administered 2013-05-13: 20 mL

## 2013-05-13 MED ORDER — HYDROMORPHONE HCL PF 1 MG/ML IJ SOLN
0.5000 mg | INTRAMUSCULAR | Status: DC | PRN
  Filled 2013-05-13: qty 1

## 2013-05-13 MED ORDER — MIDAZOLAM HCL 5 MG/5ML IJ SOLN
INTRAMUSCULAR | Status: DC | PRN
  Administered 2013-05-13: 1 mg via INTRAVENOUS

## 2013-05-13 MED ORDER — LACTATED RINGERS IV SOLN
INTRAVENOUS | Status: DC
  Filled 2013-05-13: qty 1000

## 2013-05-13 SURGICAL SUPPLY — 45 items
APL SKNCLS STERI-STRIP NONHPOA (GAUZE/BANDAGES/DRESSINGS) ×1
BANDAGE GAUZE ELAST BULKY 4 IN (GAUZE/BANDAGES/DRESSINGS) ×3 IMPLANT
BENZOIN TINCTURE PRP APPL 2/3 (GAUZE/BANDAGES/DRESSINGS) ×1 IMPLANT
BLADE HOOK ENDO STRL (BLADE) IMPLANT
BLADE SURG 15 STRL LF DISP TIS (BLADE) ×1 IMPLANT
BLADE SURG 15 STRL SS (BLADE) ×4
BLADE TRIANGLE EPF/EGR ENDO (BLADE) IMPLANT
BNDG CMPR 9X4 STRL LF SNTH (GAUZE/BANDAGES/DRESSINGS) ×1
BNDG COHESIVE 4X5 TAN NS LF (GAUZE/BANDAGES/DRESSINGS) ×2 IMPLANT
BNDG ESMARK 4X9 LF (GAUZE/BANDAGES/DRESSINGS) ×1 IMPLANT
CHLORAPREP W/TINT 26ML (MISCELLANEOUS) ×2 IMPLANT
CORDS BIPOLAR (ELECTRODE) ×1 IMPLANT
COVER MAYO STAND STRL (DRAPES) ×2 IMPLANT
COVER TABLE BACK 60X90 (DRAPES) ×2 IMPLANT
CUFF TOURN SGL QUICK 18 (TOURNIQUET CUFF) ×1 IMPLANT
DRAPE EXTREMITY T 121X128X90 (DRAPE) ×2 IMPLANT
DRAPE SURG 17X23 STRL (DRAPES) ×2 IMPLANT
DRSG EMULSION OIL 3X3 NADH (GAUZE/BANDAGES/DRESSINGS) ×2 IMPLANT
GAUZE SPONGE 4X4 12PLY STRL LF (GAUZE/BANDAGES/DRESSINGS) ×1 IMPLANT
GLOVE BIO SURGEON STRL SZ7.5 (GLOVE) ×2 IMPLANT
GLOVE BIOGEL M 7.0 STRL (GLOVE) ×1 IMPLANT
GLOVE BIOGEL M STER SZ 6 (GLOVE) ×1 IMPLANT
GLOVE BIOGEL PI IND STRL 8 (GLOVE) ×1 IMPLANT
GLOVE BIOGEL PI INDICATOR 8 (GLOVE) ×1
GLOVE ECLIPSE 6.5 STRL STRAW (GLOVE) ×1 IMPLANT
GOWN PREVENTION PLUS XLARGE (GOWN DISPOSABLE) ×1 IMPLANT
NDL HYPO 25X1 1.5 SAFETY (NEEDLE) IMPLANT
NEEDLE HYPO 25X1 1.5 SAFETY (NEEDLE) ×2 IMPLANT
NS IRRIG 500ML POUR BTL (IV SOLUTION) ×2 IMPLANT
PACK BASIN DAY SURGERY FS (CUSTOM PROCEDURE TRAY) ×2 IMPLANT
PADDING CAST ABS 4INX4YD NS (CAST SUPPLIES) ×2
PADDING CAST ABS COTTON 4X4 ST (CAST SUPPLIES) IMPLANT
SPONGE GAUZE 4X4 12PLY (GAUZE/BANDAGES/DRESSINGS) ×2 IMPLANT
STOCKINETTE 4X48 STRL (DRAPES) ×2 IMPLANT
STRIP CLOSURE SKIN 1/2X4 (GAUZE/BANDAGES/DRESSINGS) ×1 IMPLANT
SUCTION FRAZIER TIP 10 FR DISP (SUCTIONS) ×1 IMPLANT
SUT VIC AB 3-0 SH 27 (SUTURE) ×2
SUT VIC AB 3-0 SH 27X BRD (SUTURE) IMPLANT
SUT VICRYL RAPIDE 4/0 PS 2 (SUTURE) ×2 IMPLANT
SYR BULB 3OZ (MISCELLANEOUS) IMPLANT
SYR BULB IRRIGATION 50ML (SYRINGE) ×1 IMPLANT
SYRINGE 10CC LL (SYRINGE) ×1 IMPLANT
TOWEL OR 17X24 6PK STRL BLUE (TOWEL DISPOSABLE) ×2 IMPLANT
TUBE CONNECTING 12X1/4 (SUCTIONS) ×1 IMPLANT
UNDERPAD 30X30 INCONTINENT (UNDERPADS AND DIAPERS) ×2 IMPLANT

## 2013-05-13 NOTE — Anesthesia Procedure Notes (Signed)
Procedure Name: LMA Insertion Date/Time: 05/13/2013 9:26 AM Performed by: Fran Lowes Pre-anesthesia Checklist: Patient identified, Emergency Drugs available, Suction available and Patient being monitored Patient Re-evaluated:Patient Re-evaluated prior to inductionOxygen Delivery Method: Circle System Utilized Preoxygenation: Pre-oxygenation with 100% oxygen Intubation Type: IV induction Ventilation: Mask ventilation without difficulty LMA: LMA inserted LMA Size: 4.0 Number of attempts: 1 Airway Equipment and Method: bite block Placement Confirmation: positive ETCO2 Tube secured with: Tape Dental Injury: Teeth and Oropharynx as per pre-operative assessment

## 2013-05-13 NOTE — Interval H&P Note (Signed)
History and Physical Interval Note:  05/13/2013 8:37 AM  [name]  has presented today for surgery, with the diagnosis of LEFT CARPAL/CUBITAL TUNNEL SYNDROME  The various methods of treatment have been discussed with the patient and family. After consideration of risks, benefits and other options for treatment, the patient has consented to  Procedure(s): CARPAL TUNNEL RELEASE ENDOSCOPIC (Left) ULNAR NEUROPLASTY AT ELBOW (Left) as a surgical intervention .  [name] history has been reviewed, patient examined, no change in status, stable for surgery.  I have reviewed the patient's chart and labs.  Questions were answered to the patient's satisfaction.     [name] A.

## 2013-05-13 NOTE — Op Note (Signed)
05/13/2013  10:56 AM  PATIENT:  [name]  57 y.o. female  PRE-OPERATIVE DIAGNOSIS:  Left cubital and carpal tunnel syndromes  POST-OPERATIVE DIAGNOSIS:  Same  PROCEDURE:  Left endoscopic carpal tunnel release via 2 incision technique and ulnar neuroplasty at the elbow without transposition  SURGEON: Cliffton Asters. Janee Morn, MD  PHYSICIAN ASSISTANT: None  ANESTHESIA:  general  SPECIMENS:  None  DRAINS:   TLS x1  PREOPERATIVE INDICATIONS:  [name] is a  57 y.o. female with a diagnosis of left cubital and carpal tunnel syndromes, who failed conservative measures and elected for surgical management.    The risks benefits and alternatives were discussed with the patient preoperatively including but not limited to the risks of infection, bleeding, nerve injury, cardiopulmonary complications, the need for revision surgery, among others, and the patient verbalized understanding and consented to proceed.  OPERATIVE IMPLANTS: None  OPERATIVE FINDINGS: Ulnar nerve at clear impingement upon the by band crossing at Osborne's ligament. The carpal tunnel itself was also snug, but quite loose following release.  OPERATIVE PROCEDURE:  After receiving prophylactic antibiotics, the patient was escorted to the operative theatre and placed in a supine position. General anesthesia was administered. A surgical "time-out" was performed during which the planned procedure, proposed operative site, and the correct patient identity were compared to the operative consent and agreement confirmed by the circulating nurse according to current facility policy. . Following application of a tourniquet to the operative extremity, the exposed skin was prepped with Chloraprep and draped in the usual sterile fashion.  The limb was exsanguinated with an Esmarch bandage and the tourniquet inflated to approximately higher than systolic BP.  Attention was directed first to the ulnar neuroplasty. A curvilinear  incision was made at the elbow medially over the course of the nerve. Only the skin was incised sharply with scalpel. Subcutaneous tissues were dissected with blunt spreading dissection trying to protect and preserve all the crossing cutaneous nerve structures. More deeply the layers over the nerve were identified and split methodically. It was released always in the distal edge of the tourniquet to 8 cm distal to cubital tunnel. The proximal release required a second accessory incision to accomplish as a crossing band was palpated with I nerve crossed from the posterior into the anterior compartment. There was a clear site of impingement with hourglass narrowing of the nerve near the leading edge of the [facility] ligament. The FCU fascia was split distally both superficial and deep fashion the nerve was completely free at that point. The elbow was ranged through ranges of motion cyclically and there was no dislocation of the nerve from the groove. Sponges were placed into the incisions and attention directed to the carpal tunnel release.  Incisions were made sharply with a scalpel. In the proximal incision the deep form fashion was split in line with the skin incision the palmar fascia was split in line with the skin incision distally. The case was performed with 4 power loupe magnification. Distally the superficial palmar arch was visualized. The synovial reflector was then introduced proximally and used clean synovium from the deep surface of the transverse carpal ligament. The slotted cannula was then passed from proximal to distal exiting the distal wound superficial to the superficial palmar arch. The obturator was removed and the camera was inserted proximally. Visualization of the ligament was good. The diamond-tipped instrument was inserted distally and used to create a perforation in the midportion of the ligament. This was replaced by  the hook instrument which was then withdrawn distally completing the  distal hemi-transection of the ligament. This instrument and the scope were reversed in orientation and the proximal half of the ligament release. The release was judged to be good with good retraction of the ligament edges radially and ulnarly. The scope instruments were removed. The adequacy of the release was judged from proximal perspective using direct visualization with loupe assistance. The form fashion was then split for 2 inches proximal proximal incision using a sliding scissor technique. The tourniquet was released and additional hemostasis obtained and the wound is copiously irrigated. The fluid flowed nicely out the distal wound on introduced proximally indicating also a good released. A TLS drain was placed to lie alongside the nerve exiting from its own skin hole distally made with a tongue sharp trocar. The wounds were closed with 4-0 Vicryl Rapide interrupted sutures, the proximal were buried subcuticular and the palmar were standard simple sutures.    The proximal incisions were then copiously irrigated. Additional hemostasis was obtained and they were closed with 4-0 Vicryl Rapide suture superficially, and the longer one with a couple of 3-0 Vicryl buried subcuticular sutures. The elbow wound was further approximated with insulin and Steri-Strips. Local anesthetic consisting of half percent Marcaine with epinephrine was instilled in and around the regions of all the incisions as well as 5 ML's been placed on the drain the drain was clamped to be unclamped in recovery room. A bulky dressing was applied.  She was escorted to the PACU in stable condition, breathing spontaneously.  DISPOSITION: Patient will be discharged home today with typical postop instructions, returning in 10-15 days for reassessment.

## 2013-05-13 NOTE — Anesthesia Preprocedure Evaluation (Signed)
Anesthesia Evaluation  Patient identified by MRN, date of birth, ID band Patient awake  General Assessment Comment:.  History of suicide attempt  2012--  XANAX OVERDOSE   .  Schizoaffective disorder     .  Migraine     .  Depression     .  Carpal tunnel syndrome of left wrist     .  Cubital tunnel syndrome on left     .  Anxiety     Reviewed: Allergy & Precautions, H&P , NPO status , Patient's Chart, lab work & pertinent test results  Airway Mallampati: II TM Distance: >3 FB Neck ROM: Full    Dental no notable dental hx.    Pulmonary neg pulmonary ROS,  breath sounds clear to auscultation  Pulmonary exam normal       Cardiovascular Exercise Tolerance: Good negative cardio ROS  Rhythm:Regular Rate:Normal     Neuro/Psych  Headaches, PSYCHIATRIC DISORDERS Anxiety Depression Schizoaffective D/O Neuromuscular disease    GI/Hepatic negative GI ROS, Neg liver ROS,   Endo/Other  negative endocrine ROS  Renal/GU negative Renal ROS  negative genitourinary   Musculoskeletal negative musculoskeletal ROS (+)   Abdominal   Peds negative pediatric ROS (+)  Hematology negative hematology ROS (+)   Anesthesia Other Findings   Reproductive/Obstetrics negative OB ROS                           Anesthesia Physical Anesthesia Plan  ASA: II  Anesthesia Plan: General   Post-op Pain Management:    Induction: Intravenous  Airway Management Planned: LMA  Additional Equipment:   Intra-op Plan:   Post-operative Plan: Extubation in OR  Informed Consent: I have reviewed the patients History and Physical, chart, labs and discussed the procedure including the risks, benefits and alternatives for the proposed anesthesia with the patient or authorized representative who has indicated his/her understanding and acceptance.   Dental advisory given  Plan Discussed with: CRNA  Anesthesia Plan Comments:          Anesthesia Quick Evaluation

## 2013-05-13 NOTE — Anesthesia Postprocedure Evaluation (Signed)
  Anesthesia Post-op Note  Patient: [name]  Procedure(s) Performed: Procedure(s) (LRB): CARPAL TUNNEL RELEASE ENDOSCOPIC (Left) ULNAR NEUROPLASTY AT ELBOW (Left)  Patient Location: PACU  Anesthesia Type: General  Level of Consciousness: awake and alert   Airway and Oxygen Therapy: Patient Spontanous Breathing  Post-op Pain: mild  Post-op Assessment: Post-op Vital signs reviewed, Patient's Cardiovascular Status Stable, Respiratory Function Stable, Patent Airway and No signs of Nausea or vomiting  Last Vitals:  Filed Vitals:   05/13/13 1145  BP: 118/62  Pulse: 57  Temp:   Resp: 10    Post-op Vital Signs: stable   Complications: No apparent anesthesia complications

## 2013-05-13 NOTE — Transfer of Care (Signed)
Immediate Anesthesia Transfer of Care Note  Patient: [name]  Procedure(s) Performed: Procedure(s) (LRB): CARPAL TUNNEL RELEASE ENDOSCOPIC (Left) ULNAR NEUROPLASTY AT ELBOW (Left)  Patient Location: Patient transported to PACU with oxygen via face mask at 4 Liters / Min  Anesthesia Type: General  Level of Consciousness: awake and alert   Airway & Oxygen Therapy: Patient Spontanous Breathing and Patient connected to face mask oxygen  Post-op Assessment: Report given to PACU RN and Post -op Vital signs reviewed and stable  Post vital signs: Reviewed and stable  Dentition: Teeth and oropharynx remain in pre-op condition  Complications: No apparent anesthesia complications

## 2013-05-19 ENCOUNTER — Encounter (HOSPITAL_BASED_OUTPATIENT_CLINIC_OR_DEPARTMENT_OTHER): Payer: Self-pay | Admitting: Orthopedic Surgery

## 2013-09-14 ENCOUNTER — Other Ambulatory Visit: Payer: Self-pay | Admitting: Obstetrics and Gynecology

## 2013-09-14 ENCOUNTER — Other Ambulatory Visit (HOSPITAL_COMMUNITY)
Admission: RE | Admit: 2013-09-14 | Discharge: 2013-09-14 | Disposition: A | Discharge status: Home / Self Care | Payer: BC Managed Care – PPO | Source: Ambulatory Visit | Attending: Obstetrics and Gynecology | Admitting: Obstetrics and Gynecology

## 2013-09-14 DIAGNOSIS — Z01419 Encounter for gynecological examination (general) (routine) without abnormal findings: Secondary | ICD-10-CM | POA: Insufficient documentation

## 2013-09-14 DIAGNOSIS — R87810 Cervical high risk human papillomavirus (HPV) DNA test positive: Secondary | ICD-10-CM | POA: Insufficient documentation

## 2013-09-14 DIAGNOSIS — Z1151 Encounter for screening for human papillomavirus (HPV): Secondary | ICD-10-CM | POA: Insufficient documentation

## 2013-09-28 ENCOUNTER — Other Ambulatory Visit (HOSPITAL_COMMUNITY): Payer: Self-pay | Admitting: Obstetrics and Gynecology

## 2013-09-28 DIAGNOSIS — Z1231 Encounter for screening mammogram for malignant neoplasm of breast: Secondary | ICD-10-CM

## 2013-10-28 ENCOUNTER — Other Ambulatory Visit (HOSPITAL_COMMUNITY): Payer: Self-pay | Admitting: Obstetrics and Gynecology

## 2013-10-28 ENCOUNTER — Ambulatory Visit (HOSPITAL_COMMUNITY)
Admission: RE | Admit: 2013-10-28 | Discharge: 2013-10-28 | Disposition: A | Discharge status: Home / Self Care | Payer: Medicare PPO | Source: Ambulatory Visit | Attending: Obstetrics and Gynecology | Admitting: Obstetrics and Gynecology

## 2013-10-28 DIAGNOSIS — Z1231 Encounter for screening mammogram for malignant neoplasm of breast: Secondary | ICD-10-CM | POA: Insufficient documentation

## 2013-12-24 HISTORY — PX: COLONOSCOPY: SHX174

## 2014-01-18 DIAGNOSIS — Q046 Congenital cerebral cysts: Secondary | ICD-10-CM | POA: Insufficient documentation

## 2014-07-13 ENCOUNTER — Inpatient Hospital Stay (HOSPITAL_COMMUNITY)
Admission: EM | Admit: 2014-07-13 | Discharge: 2014-07-19 | DRG: 917 | Disposition: A | Discharge status: Other Acute Inpatient Hospital | Payer: Medicare PPO | Attending: Internal Medicine | Admitting: Internal Medicine

## 2014-07-13 ENCOUNTER — Encounter (HOSPITAL_COMMUNITY): Payer: Self-pay | Admitting: Emergency Medicine

## 2014-07-13 ENCOUNTER — Inpatient Hospital Stay (HOSPITAL_COMMUNITY): Payer: Medicare PPO

## 2014-07-13 DIAGNOSIS — J9600 Acute respiratory failure, unspecified whether with hypoxia or hypercapnia: Secondary | ICD-10-CM | POA: Diagnosis present

## 2014-07-13 DIAGNOSIS — F259 Schizoaffective disorder, unspecified: Secondary | ICD-10-CM | POA: Diagnosis present

## 2014-07-13 DIAGNOSIS — T43294A Poisoning by other antidepressants, undetermined, initial encounter: Secondary | ICD-10-CM | POA: Diagnosis present

## 2014-07-13 DIAGNOSIS — F39 Unspecified mood [affective] disorder: Secondary | ICD-10-CM | POA: Diagnosis present

## 2014-07-13 DIAGNOSIS — T43502A Poisoning by unspecified antipsychotics and neuroleptics, intentional self-harm, initial encounter: Secondary | ICD-10-CM | POA: Diagnosis present

## 2014-07-13 DIAGNOSIS — T424X4A Poisoning by benzodiazepines, undetermined, initial encounter: Principal | ICD-10-CM | POA: Diagnosis present

## 2014-07-13 DIAGNOSIS — F411 Generalized anxiety disorder: Secondary | ICD-10-CM | POA: Diagnosis present

## 2014-07-13 DIAGNOSIS — E872 Acidosis, unspecified: Secondary | ICD-10-CM | POA: Diagnosis present

## 2014-07-13 DIAGNOSIS — F332 Major depressive disorder, recurrent severe without psychotic features: Secondary | ICD-10-CM | POA: Diagnosis present

## 2014-07-13 DIAGNOSIS — I959 Hypotension, unspecified: Secondary | ICD-10-CM | POA: Diagnosis present

## 2014-07-13 DIAGNOSIS — I10 Essential (primary) hypertension: Secondary | ICD-10-CM | POA: Diagnosis present

## 2014-07-13 DIAGNOSIS — Z609 Problem related to social environment, unspecified: Secondary | ICD-10-CM | POA: Diagnosis not present

## 2014-07-13 DIAGNOSIS — R451 Restlessness and agitation: Secondary | ICD-10-CM

## 2014-07-13 DIAGNOSIS — T50902S Poisoning by unspecified drugs, medicaments and biological substances, intentional self-harm, sequela: Secondary | ICD-10-CM

## 2014-07-13 DIAGNOSIS — Z87891 Personal history of nicotine dependence: Secondary | ICD-10-CM | POA: Diagnosis not present

## 2014-07-13 DIAGNOSIS — E876 Hypokalemia: Secondary | ICD-10-CM | POA: Diagnosis present

## 2014-07-13 DIAGNOSIS — N390 Urinary tract infection, site not specified: Secondary | ICD-10-CM | POA: Diagnosis present

## 2014-07-13 DIAGNOSIS — T438X2A Poisoning by other psychotropic drugs, intentional self-harm, initial encounter: Secondary | ICD-10-CM | POA: Diagnosis present

## 2014-07-13 DIAGNOSIS — D649 Anemia, unspecified: Secondary | ICD-10-CM | POA: Diagnosis present

## 2014-07-13 DIAGNOSIS — D696 Thrombocytopenia, unspecified: Secondary | ICD-10-CM | POA: Diagnosis present

## 2014-07-13 DIAGNOSIS — R4182 Altered mental status, unspecified: Secondary | ICD-10-CM | POA: Diagnosis present

## 2014-07-13 DIAGNOSIS — J9601 Acute respiratory failure with hypoxia: Secondary | ICD-10-CM | POA: Diagnosis present

## 2014-07-13 DIAGNOSIS — I9589 Other hypotension: Secondary | ICD-10-CM

## 2014-07-13 DIAGNOSIS — T50901A Poisoning by unspecified drugs, medicaments and biological substances, accidental (unintentional), initial encounter: Secondary | ICD-10-CM

## 2014-07-13 DIAGNOSIS — T50902A Poisoning by unspecified drugs, medicaments and biological substances, intentional self-harm, initial encounter: Secondary | ICD-10-CM

## 2014-07-13 DIAGNOSIS — R45851 Suicidal ideations: Secondary | ICD-10-CM

## 2014-07-13 LAB — I-STAT CHEM 8, ED
BUN: 13 mg/dL (ref 6–23)
CALCIUM ION: 1.23 mmol/L (ref 1.12–1.23)
CREATININE: 0.8 mg/dL (ref 0.50–1.10)
Chloride: 109 mEq/L (ref 96–112)
Glucose, Bld: 122 mg/dL — ABNORMAL HIGH (ref 70–99)
HCT: 37 % (ref 36.0–46.0)
HEMOGLOBIN: 12.6 g/dL (ref 12.0–15.0)
Potassium: 2.1 mEq/L — CL (ref 3.7–5.3)
SODIUM: 145 meq/L (ref 137–147)
TCO2: 19 mmol/L (ref 0–100)

## 2014-07-13 LAB — CBC WITH DIFFERENTIAL/PLATELET
BASOS PCT: 1 % (ref 0–1)
Basophils Absolute: 0 10*3/uL (ref 0.0–0.1)
Basophils Absolute: 0 10*3/uL (ref 0.0–0.1)
Basophils Relative: 0 % (ref 0–1)
EOS PCT: 1 % (ref 0–5)
Eosinophils Absolute: 0 10*3/uL (ref 0.0–0.7)
Eosinophils Absolute: 0 10*3/uL (ref 0.0–0.7)
Eosinophils Relative: 1 % (ref 0–5)
HCT: 33.7 % — ABNORMAL LOW (ref 36.0–46.0)
HEMATOCRIT: 28.2 % — AB (ref 36.0–46.0)
HEMOGLOBIN: 11.7 g/dL — AB (ref 12.0–15.0)
Hemoglobin: 9.7 g/dL — ABNORMAL LOW (ref 12.0–15.0)
LYMPHS ABS: 2.4 10*3/uL (ref 0.7–4.0)
LYMPHS PCT: 43 % (ref 12–46)
Lymphocytes Relative: 32 % (ref 12–46)
Lymphs Abs: 1.3 10*3/uL (ref 0.7–4.0)
MCH: 33.2 pg (ref 26.0–34.0)
MCH: 33.2 pg (ref 26.0–34.0)
MCHC: 34.7 g/dL (ref 30.0–36.0)
MCHC: 34.4 g/dL (ref 30.0–36.0)
MCV: 95.7 fL (ref 78.0–100.0)
MCV: 96.6 fL (ref 78.0–100.0)
MONO ABS: 0.3 10*3/uL (ref 0.1–1.0)
MONOS PCT: 10 % (ref 3–12)
Monocytes Absolute: 0.5 10*3/uL (ref 0.1–1.0)
Monocytes Relative: 8 % (ref 3–12)
NEUTROS ABS: 2.6 10*3/uL (ref 1.7–7.7)
NEUTROS PCT: 46 % (ref 43–77)
NEUTROS PCT: 59 % (ref 43–77)
Neutro Abs: 2.4 10*3/uL (ref 1.7–7.7)
PLATELETS: 175 10*3/uL (ref 150–400)
Platelets: 124 10*3/uL — ABNORMAL LOW (ref 150–400)
RBC: 3.52 MIL/uL — AB (ref 3.87–5.11)
RBC: 2.92 MIL/uL — ABNORMAL LOW (ref 3.87–5.11)
RDW: 14.4 % (ref 11.5–15.5)
RDW: 14.5 % (ref 11.5–15.5)
WBC: 5.6 10*3/uL (ref 4.0–10.5)
WBC: 4 10*3/uL (ref 4.0–10.5)

## 2014-07-13 LAB — COMPREHENSIVE METABOLIC PANEL
ALBUMIN: 2.8 g/dL — AB (ref 3.5–5.2)
ALK PHOS: 87 U/L (ref 39–117)
ALT: 6 U/L (ref 0–35)
ALT: 10 U/L (ref 0–35)
ANION GAP: 14 (ref 5–15)
ANION GAP: 13 (ref 5–15)
AST: 12 U/L (ref 0–37)
AST: 21 U/L (ref 0–37)
Albumin: 3.7 g/dL (ref 3.5–5.2)
Alkaline Phosphatase: 67 U/L (ref 39–117)
BILIRUBIN TOTAL: 0.3 mg/dL (ref 0.3–1.2)
BUN: 15 mg/dL (ref 6–23)
BUN: 11 mg/dL (ref 6–23)
CALCIUM: 7.2 mg/dL — AB (ref 8.4–10.5)
CHLORIDE: 110 meq/L (ref 96–112)
CO2: 21 meq/L (ref 19–32)
CO2: 18 mEq/L — ABNORMAL LOW (ref 19–32)
CREATININE: 0.75 mg/dL (ref 0.50–1.10)
Calcium: 9.1 mg/dL (ref 8.4–10.5)
Chloride: 114 mEq/L — ABNORMAL HIGH (ref 96–112)
Creatinine, Ser: 0.81 mg/dL (ref 0.50–1.10)
GFR calc non Af Amer: >90 mL/min (ref >90)
GFR, EST NON AFRICAN AMERICAN: 78 mL/min — AB (ref >90)
GLUCOSE: 127 mg/dL — AB (ref 70–99)
Glucose, Bld: 147 mg/dL — ABNORMAL HIGH (ref 70–99)
POTASSIUM: 2.3 meq/L — AB (ref 3.7–5.3)
Potassium: 2.8 mEq/L — CL (ref 3.7–5.3)
SODIUM: 145 meq/L (ref 137–147)
Sodium: 145 mEq/L (ref 137–147)
TOTAL PROTEIN: 6.1 g/dL (ref 6.0–8.3)
TOTAL PROTEIN: 4.7 g/dL — AB (ref 6.0–8.3)
Total Bilirubin: <0.2 mg/dL — ABNORMAL LOW (ref 0.3–1.2)

## 2014-07-13 LAB — I-STAT TROPONIN, ED: Troponin i, poc: 0 ng/mL (ref 0.00–0.08)

## 2014-07-13 LAB — TROPONIN I: Troponin I: <0.3 ng/mL (ref <0.30)

## 2014-07-13 LAB — RAPID URINE DRUG SCREEN, HOSP PERFORMED
Amphetamines: POSITIVE — AB
BARBITURATES: NOT DETECTED
BENZODIAZEPINES: POSITIVE — AB
COCAINE: NOT DETECTED
Opiates: NOT DETECTED
TETRAHYDROCANNABINOL: POSITIVE — AB

## 2014-07-13 LAB — URINALYSIS, ROUTINE W REFLEX MICROSCOPIC
BILIRUBIN URINE: NEGATIVE
Glucose, UA: NEGATIVE mg/dL
HGB URINE DIPSTICK: NEGATIVE
KETONES UR: NEGATIVE mg/dL
Leukocytes, UA: NEGATIVE
Nitrite: NEGATIVE
Protein, ur: NEGATIVE mg/dL
SPECIFIC GRAVITY, URINE: 1.022 (ref 1.005–1.030)
UROBILINOGEN UA: 0.2 mg/dL (ref 0.0–1.0)
pH: 6 (ref 5.0–8.0)

## 2014-07-13 LAB — PHOSPHORUS: PHOSPHORUS: 2.5 mg/dL (ref 2.3–4.6)

## 2014-07-13 LAB — MAGNESIUM
Magnesium: 2.2 mg/dL (ref 1.5–2.5)
Magnesium: 1.6 mg/dL (ref 1.5–2.5)

## 2014-07-13 LAB — CARBAMAZEPINE LEVEL, TOTAL: Carbamazepine Lvl: 1 ug/mL — ABNORMAL LOW (ref 4.0–12.0)

## 2014-07-13 LAB — TSH: TSH: 1.44 u[IU]/mL (ref 0.350–4.500)

## 2014-07-13 LAB — SALICYLATE LEVEL: Salicylate Lvl: <2 mg/dL — ABNORMAL LOW (ref 2.8–20.0)

## 2014-07-13 LAB — I-STAT CG4 LACTIC ACID, ED: Lactic Acid, Venous: 1.68 mmol/L (ref 0.5–2.2)

## 2014-07-13 LAB — MRSA PCR SCREENING: MRSA by PCR: NEGATIVE

## 2014-07-13 LAB — CBG MONITORING, ED: Glucose-Capillary: 89 mg/dL (ref 70–99)

## 2014-07-13 LAB — ETHANOL

## 2014-07-13 LAB — ACETAMINOPHEN LEVEL: Acetaminophen (Tylenol), Serum: <15 ug/mL (ref 10–30)

## 2014-07-13 MED ORDER — POTASSIUM CHLORIDE 10 MEQ/100ML IV SOLN
INTRAVENOUS | Status: AC
  Administered 2014-07-13: 10 meq via INTRAVENOUS
  Filled 2014-07-13: qty 100

## 2014-07-13 MED ORDER — POTASSIUM CHLORIDE 10 MEQ/100ML IV SOLN
10.0000 meq | INTRAVENOUS | Status: AC
  Administered 2014-07-13 (×2): 10 meq via INTRAVENOUS
  Filled 2014-07-13: qty 100

## 2014-07-13 MED ORDER — TOPIRAMATE 100 MG PO TABS: 200.0000 mg | ORAL_TABLET | Freq: Every day | ORAL | Status: DC

## 2014-07-13 MED ORDER — QUETIAPINE FUMARATE 50 MG PO TABS: 50.0000 mg | ORAL_TABLET | Freq: Every day | ORAL | Status: DC

## 2014-07-13 MED ORDER — SODIUM CHLORIDE 0.9 % IV SOLN
INTRAVENOUS | Status: DC
Start: 2014-07-13 — End: 2014-07-14
  Administered 2014-07-13 – 2014-07-14 (×3): via INTRAVENOUS

## 2014-07-13 MED ORDER — ACETAMINOPHEN 325 MG PO TABS
650.0000 mg | ORAL_TABLET | Freq: Four times a day (QID) | ORAL | Status: DC | PRN
  Administered 2014-07-18: 650 mg via ORAL
  Filled 2014-07-13: qty 2

## 2014-07-13 MED ORDER — ACETAMINOPHEN 650 MG RE SUPP: 650.0000 mg | Freq: Four times a day (QID) | RECTAL | Status: DC | PRN

## 2014-07-13 MED ORDER — CARBAMAZEPINE 200 MG PO TABS: 200.0000 mg | ORAL_TABLET | Freq: Two times a day (BID) | ORAL | Status: DC

## 2014-07-13 MED ORDER — SODIUM CHLORIDE 0.9 % IV BOLUS (SEPSIS)
1000.0000 mL | Freq: Once | INTRAVENOUS | Status: AC
  Administered 2014-07-13: 1000 mL via INTRAVENOUS

## 2014-07-13 MED ORDER — POTASSIUM CHLORIDE 10 MEQ/100ML IV SOLN
10.0000 meq | INTRAVENOUS | Status: AC
  Administered 2014-07-13 (×2): 10 meq via INTRAVENOUS
  Filled 2014-07-13 (×2): qty 100

## 2014-07-13 MED ORDER — HALOPERIDOL LACTATE 5 MG/ML IJ SOLN
2.0000 mg | Freq: Once | INTRAMUSCULAR | Status: AC
  Administered 2014-07-13: 2 mg via INTRAVENOUS
  Filled 2014-07-13: qty 1

## 2014-07-13 MED ORDER — MAGNESIUM SULFATE 40 MG/ML IJ SOLN
2.0000 g | Freq: Once | INTRAMUSCULAR | Status: AC
  Administered 2014-07-13: 2 g via INTRAVENOUS
  Filled 2014-07-13: qty 50

## 2014-07-13 MED ORDER — SODIUM CHLORIDE 0.9 % IJ SOLN
3.0000 mL | Freq: Two times a day (BID) | INTRAMUSCULAR | Status: DC
Start: 2014-07-13 — End: 2014-07-19
  Administered 2014-07-13: 20:00:00 via INTRAVENOUS
  Administered 2014-07-14 – 2014-07-16 (×4): 3 mL via INTRAVENOUS

## 2014-07-13 MED ORDER — POTASSIUM CHLORIDE CRYS ER 20 MEQ PO TBCR: 40.0000 meq | EXTENDED_RELEASE_TABLET | Freq: Once | ORAL | Status: DC

## 2014-07-13 MED ORDER — POTASSIUM CHLORIDE CRYS ER 20 MEQ PO TBCR
40.0000 meq | EXTENDED_RELEASE_TABLET | Freq: Two times a day (BID) | ORAL | Status: DC
  Administered 2014-07-13: 40 meq via ORAL
  Filled 2014-07-13: qty 2

## 2014-07-13 NOTE — ED Notes (Signed)
Potassium 2.3 called from lab. [place] Therapist, sports made aware.

## 2014-07-13 NOTE — Progress Notes (Signed)
Pt transferred to ICU at 1745. VSS. Pt requested that sister Reino Bellis be called and told about her admission and condition. Harriet called by RN and notified that sister was in ICU and stable. Pt's ex-husband Eduard Clos called to inquire about pt's condition. As per charge RN, RN obtained verbal consent from pt to share personal health information with ex-husband. Pt stated RN could "share any information we wanted" with her ex-husband and sister. Pt spoke with husband prior to RN speaking with ex-husband. Pt explained her condition and admission to ex-husband. RN then spoke with ex-husband, who wanted to give his contact information in case anyone needed to reach him. Pt's ex-husband and sister state they are POA for pt. However, sister and ex-husband made aware that as long as pt is alert and oriented she will be making her decisions.

## 2014-07-13 NOTE — H&P (Signed)
[facility] History and Physical  [name] XFG:[id number] DOB: 08-02-1956 DOA: 07/13/2014  Referring physician: ER physician PCP: No primary provider on file.   Chief Complaint: drug overdose   HPI:  58 year old female with past medical history of schizoaffective disorder, anxiety and depression, history of suicide attempt with Xanax overdose who presented to [facility] ED 07/13/2014 after an intention to harm herself with ingesting multiple pills of clonazepam and nefazodone on the morning of this admission. No reports of chest pain, shortness of breath, palpitations. No abdominal pain, nausea or vomiting. No fevers or chills.  In ED, BP was 65/51, HR 54-89, T max 98.1 F and oxygen saturation of 88% on room air. BP improved to 116/61 with IV fluids, oxygen saturation improved with [place] oxygen support. Blood work showed hypokalemia of 2.3 which was repleted. CXR was unremarkable.   Assessment & Plan    Principal Problem:   Drug overdose / suicidal ideations - psych consulted; sitter at the bedside - hold all home meds as pharmacy has hard time verifying if pt truly takes those meds (listed in med rec) - observe in SDU for first 24 hours - the 12 lead EKG showed sinus rhythm - would hold ativan due to low BP; may give haldol if needed until BP better  Active Problems:   Acute respiratory failure with hypoxia - likely due to drug overdose - improved with [place] oxygen support   Hypokalemia - repleted in ED and on the floor - follow up BMP in am   Hypotension, unspecified - likely due to overdose, clonazepam - BP 66/51 but improved with IV fluids, 101/67 - continue IV fluids   DVT prophylaxis: SCD's bialterally   Radiological Exams on Admission: No results found.  EKG: sinus rhythm  Code Status: Full Family Communication: Plan of care discussed with the patient  Disposition Plan: Admit for further evaluation  Leisa Lenz, MD  [facility] Pager [phone]  Review of  Systems:  Constitutional: Negative for fever, chills and malaise/fatigue. Negative for diaphoresis.  HENT: Negative for hearing loss, ear pain, nosebleeds, congestion, sore throat, neck pain, tinnitus and ear discharge.   Eyes: Negative for blurred vision, double vision, photophobia, pain, discharge and redness.  Respiratory: Negative for cough, hemoptysis, sputum production, shortness of breath, wheezing and stridor.   Cardiovascular: Negative for chest pain, palpitations, orthopnea, claudication and leg swelling.  Gastrointestinal: Negative for nausea, vomiting and abdominal pain. Negative for heartburn, constipation, blood in stool and melena.  Genitourinary: Negative for dysuria, urgency, frequency, hematuria and flank pain.  Musculoskeletal: Negative for myalgias, back pain, joint pain and falls.  Skin: Negative for itching and rash.  Neurological: Negative for dizziness and weakness. Negative for tingling, tremors, sensory change, speech change, focal weakness, loss of consciousness and headaches.  Endo/Heme/Allergies: Negative for environmental allergies and polydipsia. Does not bruise/bleed easily.  Psychiatric/Behavioral: per HPI    Past Medical History  Diagnosis Date  . History of suicide attempt 2012--  XANAX OVERDOSE  . Schizoaffective disorder   . Migraine   . Depression   . Carpal tunnel syndrome of left wrist   . Cubital tunnel syndrome on left   . Anxiety    Past Surgical History  Procedure Laterality Date  . Cervical biopsy  w/ loop electrode excision  12-01-2008    CIN II  . Femoral hernia repair Left 10-31-2009    AND INGUINAL LYMPH NODE BX  . Lumbar disc surgery  X3   LAST ONE 1989  FUSION  . Repair partial finger amputation  1989  . Carpal tunnel release Left 05/13/2013    Procedure: CARPAL TUNNEL RELEASE ENDOSCOPIC;  Surgeon: Jolyn Nap, MD;  Location: [facility];  Service: Orthopedics;  Laterality: Left;  Incision at 0955.   Marland Kitchen Nerve  repair Left 05/13/2013    Procedure: ULNAR NEUROPLASTY AT ELBOW;  Surgeon: Jolyn Nap, MD;  Location: [facility];  Service: Orthopedics;  Laterality: Left;   Social History:  reports that she has quit smoking. She has never used smokeless tobacco. She reports that she does not drink alcohol or use illicit drugs.  No Known Allergies  Family History: History reviewed. No pertinent family history.   Prior to Admission medications   Medication Sig Start Date End Date Taking? Authorizing Provider  carbamazepine (TEGRETOL) 200 MG tablet Take 200 mg by mouth 2 (two) times daily.   Yes Historical Provider, MD  clonazePAM (KLONOPIN) 1 MG tablet Take 1 mg by mouth 3 (three) times daily as needed for anxiety.   Yes Historical Provider, MD  EPITOL 200 MG tablet Take 200 mg by mouth 2 (two) times daily. 06/29/14   Historical Provider, MD  QUEtiapine (SEROQUEL) 50 MG tablet Take 50 mg by mouth at bedtime.    Historical Provider, MD  Topiramate (TOPAMAX PO) Take 250 mg by mouth every morning.    Historical Provider, MD   Physical Exam: Filed Vitals:   07/13/14 1530 07/13/14 1600 07/13/14 1630 07/13/14 1700  BP: 93/78 97/47 90/54  101/67  Pulse: 73  71 61  Resp: 15 15 13 13   Height:    5\' 1"  (1.549 m)  Weight:    53.6 kg (118 lb 2.7 oz)  SpO2: 99%  90% 95%    Physical Exam  Constitutional: Appears well-developed and well-nourished. No distress.  HENT: Normocephalic. No tonsillar erythema or exudates Eyes: Conjunctivae and EOM are normal. PERRLA, no scleral icterus.  Neck: Normal ROM. Neck supple. No JVD. No tracheal deviation. No thyromegaly.  CVS: RRR, S1/S2 +, no murmurs, no gallops, no carotid bruit.  Pulmonary: Effort and breath sounds normal, no stridor, rhonchi, wheezes, rales.  Abdominal: Soft. BS +,  no distension, tenderness, rebound or guarding.  Musculoskeletal: Normal range of motion. No edema and no tenderness.  Lymphadenopathy: No lymphadenopathy noted, cervical,  inguinal. Neuro: Alert. Normal reflexes, muscle tone coordination. No focal neurologic deficits. Skin: Skin is warm and dry. No rash noted. Not diaphoretic. No erythema. No pallor.  Psychiatric: Normal mood and affect. Behavior, judgment, thought content normal.   Labs on Admission:  Basic Metabolic Panel:  Recent Labs Lab 07/13/14 1259 07/13/14 1415 07/13/14 1423  NA  --  145 145  K  --  2.3* 2.1*  CL  --  110 109  CO2  --  21  --   GLUCOSE  --  127* 122*  BUN  --  15 13  CREATININE  --  0.81 0.80  CALCIUM  --  9.1  --   MG 2.2  --   --    Liver Function Tests:  Recent Labs Lab 07/13/14 1415  AST 12  ALT 6  ALKPHOS 87  BILITOT 0.3  PROT 6.1  ALBUMIN 3.7   No results found for this basename: LIPASE, AMYLASE,  in the last 168 hours No results found for this basename: AMMONIA,  in the last 168 hours CBC:  Recent Labs Lab 07/13/14 1415 07/13/14 1423  WBC 5.6  --   NEUTROABS  2.6  --   HGB 11.7* 12.6  HCT 33.7* 37.0  MCV 95.7  --   PLT 175  --    Cardiac Enzymes:  Recent Labs Lab 07/13/14 1415  TROPONINI <0.30   BNP: No components found with this basename: POCBNP,  CBG:  Recent Labs Lab 07/13/14 1430  GLUCAP 89    If 7PM-7AM, please contact night-coverage [email or web address] Password [facility] 07/13/2014, 5:31 PM

## 2014-07-13 NOTE — ED Notes (Signed)
Bed: RESB Expected date: 07/13/14 Expected time: 1:25 PM Means of arrival:  Comments: ems overdose

## 2014-07-13 NOTE — ED Notes (Signed)
In and Out done with Luetta Nutting, Corene Cornea and [place].

## 2014-07-13 NOTE — ED Provider Notes (Signed)
TIME SEEN: 2:18 PM  CHIEF COMPLAINT: overdose  HPI: Patient is a 58 y.o. F history of schizoaffective disorder, prior suicide attempt with Xanax overdose, depression, anxiety, migraine headaches who presents to the emergency department after a overdose. Patient reports she intentionally took 45 1 mg clonazepam and 1 200 mg nefazodone tablet at approximately 11 AM in an attempt to kill herself. She denies any other coingestants. Denies any illicit drug or alcohol use. She denies any chest pain or shortness of breath. No recent vomiting or diarrhea. No cough, dysuria or hematuria. No rash. No tick bite. She is hypertensive in the emergency department. She is not on blood pressure medication. She is unable to tell me what her blood pressure normally runs.  ROS: See HPI Constitutional: no fever  Eyes: no drainage  ENT: no runny nose   Cardiovascular:  no chest pain  Resp: no SOB  GI: no vomiting GU: no dysuria Integumentary: no rash  Allergy: no hives  Musculoskeletal: no leg swelling  Neurological: no slurred speech ROS otherwise negative  PAST MEDICAL HISTORY/PAST SURGICAL HISTORY:  Past Medical History  Diagnosis Date  . History of suicide attempt 2012--  XANAX OVERDOSE  . Schizoaffective disorder   . Migraine   . Depression   . Carpal tunnel syndrome of left wrist   . Cubital tunnel syndrome on left   . Anxiety     MEDICATIONS:  Prior to Admission medications   Medication Sig Start Date End Date Taking? Authorizing Provider  clonazePAM (KLONOPIN) 0.5 MG tablet Take 1 mg by mouth at bedtime. AND TAKES ONE TAB DURING THE DAY PRN    Historical Provider, MD  gabapentin (NEURONTIN) 400 MG capsule Take 400 mg by mouth 4 (four) times daily.     Historical Provider, MD  HYDROcodone-acetaminophen (NORCO) 5-325 MG per tablet Take 1 tablet by mouth every 6 (six) hours as needed for pain. 05/13/13   Jolyn Nap, MD  nefazodone (SERZONE) 200 MG tablet Take 100 mg by mouth at bedtime as  needed and may repeat dose one time if needed.    Historical Provider, MD  QUEtiapine (SEROQUEL) 50 MG tablet Take 50 mg by mouth at bedtime.    Historical Provider, MD  terconazole (TERAZOL 7) 0.4 % vaginal cream Place 1 applicator vaginally at bedtime. 05/13/13   Jolyn Nap, MD  Topiramate (TOPAMAX PO) Take 250 mg by mouth every morning.    Historical Provider, MD    ALLERGIES:  No Known Allergies  SOCIAL HISTORY:  History  Substance Use Topics  . Smoking status: Not on file  . Smokeless tobacco: Never Used  . Alcohol Use: No    FAMILY HISTORY: No family history on file.  EXAM: BP 72/43  Pulse 57  Resp 15  SpO2 95% CONSTITUTIONAL: Alert and oriented and responds appropriately to questions. Thin, elderly, appears older than stated age, patient's speech is very slurred and difficult to understand but she is awake, talking and moving all of her extremities HEAD: Normocephalic EYES: Conjunctivae clear, PERRL ENT: normal nose; no rhinorrhea; dry mucous membranes; pharynx without lesions noted NECK: Supple, no meningismus, no LAD  CARD: Regular and bradycardic; S1 and S2 appreciated; no murmurs, no clicks, no rubs, no gallops RESP: Normal chest excursion without splinting or tachypnea; breath sounds clear and equal bilaterally; no wheezes, no rhonchi, no rales,  ABD/GI: Normal bowel sounds; non-distended; soft, non-tender, no rebound, no guarding BACK:  The back appears normal and is non-tender to palpation, there is  no CVA tenderness EXT: Normal ROM in all joints; non-tender to palpation; no edema; normal capillary refill; no cyanosis    SKIN: Normal color for age and race; warm NEURO: Moves all extremities equally, patient is dysarthric, sensation to light touch intact diffusely, cranial nerves II through XII intact PSYCH: [name] mood and manner are appropriate. Grooming and personal hygiene are appropriate.  MEDICAL DECISION MAKING: Patient here with overdose,  hypertension. She does appear very dry on exam and her hypotension maybe secondary to her overdose on clonazepam. We'll obtain labs including acetaminophen and salicylate level, urine drug screen and alcohol level. We'll give IV fluids. Will obtain EKG and troponin. Urinalysis pending.  ED PROGRESS: Patient's blood pressure has improved with IV fluids. Her labs show she is hypokalemic. Will replace with IV potassium. Denies any recent vomiting or diarrhea. She is not on diuretics.  Patient is now more awake, agitated, sitting upright in bed and yelling at staff. Attempting to leave. Have filled out IVC paperwork. Her blood pressure has improved but is still slightly soft, systolic pressures in the 90s. She is not on any antihypertensive medications, beta blockers. Discussed with poison control who recommended repeat her EKG, check a magnesium level. Have discussed with Dr. Charlies Silvers with hospitalist service for admission to step down, inpatient.     EKG Interpretation  Date/Time:  Tuesday July 13 2014 14:25:07 EDT Ventricular Rate:  56 PR Interval:  188 QRS Duration: 98 QT Interval:  562 QTC Calculation: 542 R Axis:   67 Text Interpretation:  Sinus rhythm Low voltage, precordial leads Prolonged QT interval Confirmed by WARD,  DO, KRISTEN ([id number]) on 07/13/2014 2:42:52 PM        CRITICAL CARE Performed by: Nyra Jabs   Total critical care time: 45 minutes  Critical care time was exclusive of separately billable procedures and treating other patients.  Critical care was necessary to treat or prevent imminent or life-threatening deterioration.  Critical care was time spent personally by me on the following activities: development of treatment plan with patient and/or surrogate as well as nursing, discussions with consultants, evaluation of patient's response to treatment, examination of patient, obtaining history from patient or surrogate, ordering and performing treatments and  interventions, ordering and review of laboratory studies, ordering and review of radiographic studies, pulse oximetry and re-evaluation of patient's condition.   [place], DO 07/13/14 1655

## 2014-07-13 NOTE — ED Notes (Signed)
Patient belongings: [name], shoes, shirt, bra, cell phone money and jewelry.

## 2014-07-13 NOTE — ED Notes (Signed)
Per EMS-pt took 45 1mg  Clonazepam and an unknown amount of 200mg  Nefazodone at 11am. SI. Lethargic. States that this is her 3rd SI attempt.

## 2014-07-13 NOTE — Progress Notes (Signed)
  CARE MANAGEMENT ED NOTE 07/13/2014  Patient:  QUINNETTA, ROEPKE   Account Number:  [id number]  Date Initiated:  07/13/2014  Documentation initiated by:  Jackelyn Poling  Subjective/Objective Assessment:   58 yr old [place] resident pt took 22 1mg  Clonazepam and an unknown amount of 200mg  Nefazodone at 11am. SI. Lethargic. States that this is her 3rd SI attempt.     Subjective/Objective Assessment Detail:   No pcp listed  CM consult for: Pt states that her husband "Eduard Clos is not going to pay for my meds anymore." States she can't pay for meds. Pt very uncooperative at present time. Please provide available resources when pt is more cooperative  pt confirmed she has active [place] medicare choice ppo coverage  Pt informed ED CM she is on a fixed income of $1500 and gave her ex husband "two thousand dollars" and she asked for it back & he refused to given it back to her  Pt with elevated agitated voice Pt states she takes medication for agitation  Pt states she does not know if ex husband will assist with copay for medicine She states she had insurance company to change medication to generic so it went from $85 to $40     Action/Plan:   ED M noted CM consult CM spoke with pt about CM consult, medication concerns, social concerns with husband, Medication changes by pcp and insurance company.   Action/Plan Detail:   Discussed with pt that she is not eligible for [facility] MATCH program because she has [employer] coverage   Anticipated DC Date:  07/16/2014     Status Recommendation to Physician:   Result of Recommendation:    Other ED Services  Consult [facility] [place]  Other  Outpatient Services - Pt will follow up  PCP issues    Choice offered to / List presented to:            Status of service:  Completed, signed off  ED Comments:   ED Comments Detail:    Pt reports pcp is Dr Theodoro Grist and she [facility] medication assistance EPIC updated

## 2014-07-14 DIAGNOSIS — F411 Generalized anxiety disorder: Secondary | ICD-10-CM

## 2014-07-14 LAB — CBC
HEMATOCRIT: 28 % — AB (ref 36.0–46.0)
Hemoglobin: 9.7 g/dL — ABNORMAL LOW (ref 12.0–15.0)
MCH: 33.3 pg (ref 26.0–34.0)
MCHC: 34.6 g/dL (ref 30.0–36.0)
MCV: 96.2 fL (ref 78.0–100.0)
Platelets: 126 10*3/uL — ABNORMAL LOW (ref 150–400)
RBC: 2.91 MIL/uL — ABNORMAL LOW (ref 3.87–5.11)
RDW: 14.6 % (ref 11.5–15.5)
WBC: 4.8 10*3/uL (ref 4.0–10.5)

## 2014-07-14 LAB — COMPREHENSIVE METABOLIC PANEL
ALBUMIN: 2.8 g/dL — AB (ref 3.5–5.2)
ALK PHOS: 66 U/L (ref 39–117)
ALT: 8 U/L (ref 0–35)
AST: 16 U/L (ref 0–37)
Anion gap: 11 (ref 5–15)
BILIRUBIN TOTAL: 0.2 mg/dL — AB (ref 0.3–1.2)
BUN: 9 mg/dL (ref 6–23)
CHLORIDE: 119 meq/L — AB (ref 96–112)
CO2: 17 mEq/L — ABNORMAL LOW (ref 19–32)
CREATININE: 0.75 mg/dL (ref 0.50–1.10)
Calcium: 7.5 mg/dL — ABNORMAL LOW (ref 8.4–10.5)
GFR calc Af Amer: >90 mL/min (ref >90)
GFR calc non Af Amer: >90 mL/min (ref >90)
Glucose, Bld: 100 mg/dL — ABNORMAL HIGH (ref 70–99)
POTASSIUM: 2.7 meq/L — AB (ref 3.7–5.3)
Sodium: 147 mEq/L (ref 137–147)
Total Protein: 4.7 g/dL — ABNORMAL LOW (ref 6.0–8.3)

## 2014-07-14 LAB — GLUCOSE, CAPILLARY: Glucose-Capillary: 89 mg/dL (ref 70–99)

## 2014-07-14 LAB — MAGNESIUM: Magnesium: 1.7 mg/dL (ref 1.5–2.5)

## 2014-07-14 MED ORDER — HALOPERIDOL LACTATE 5 MG/ML IJ SOLN: 1.0000 mg | Freq: Four times a day (QID) | INTRAMUSCULAR | Status: DC | PRN

## 2014-07-14 MED ORDER — LORAZEPAM 2 MG/ML IJ SOLN
1.0000 mg | Freq: Four times a day (QID) | INTRAMUSCULAR | Status: DC | PRN
  Administered 2014-07-14 – 2014-07-15 (×4): 1 mg via INTRAVENOUS
  Filled 2014-07-14 (×4): qty 1

## 2014-07-14 MED ORDER — POTASSIUM CHLORIDE CRYS ER 20 MEQ PO TBCR
40.0000 meq | EXTENDED_RELEASE_TABLET | Freq: Once | ORAL | Status: DC
  Filled 2014-07-14: qty 2

## 2014-07-14 MED ORDER — HALOPERIDOL LACTATE 5 MG/ML IJ SOLN
1.0000 mg | Freq: Once | INTRAMUSCULAR | Status: AC
  Administered 2014-07-14: 1 mg via INTRAVENOUS
  Filled 2014-07-14: qty 1

## 2014-07-14 MED ORDER — POTASSIUM CHLORIDE CRYS ER 20 MEQ PO TBCR
40.0000 meq | EXTENDED_RELEASE_TABLET | ORAL | Status: DC
  Administered 2014-07-14: 40 meq via ORAL
  Filled 2014-07-14 (×2): qty 2

## 2014-07-14 MED ORDER — POTASSIUM CHLORIDE CRYS ER 20 MEQ PO TBCR
40.0000 meq | EXTENDED_RELEASE_TABLET | ORAL | Status: AC
  Filled 2014-07-14 (×2): qty 2

## 2014-07-14 MED ORDER — POTASSIUM CHLORIDE CRYS ER 20 MEQ PO TBCR: 40.0000 meq | EXTENDED_RELEASE_TABLET | Freq: Two times a day (BID) | ORAL | Status: DC

## 2014-07-14 MED ORDER — ENSURE COMPLETE PO LIQD
237.0000 mL | Freq: Two times a day (BID) | ORAL | Status: DC
  Administered 2014-07-16: 237 mL via ORAL

## 2014-07-14 MED ORDER — POTASSIUM CHLORIDE IN NACL 40-0.9 MEQ/L-% IV SOLN
INTRAVENOUS | Status: DC
  Administered 2014-07-14: 75 mL/h via INTRAVENOUS
  Administered 2014-07-14 – 2014-07-16 (×3): 50 mL/h via INTRAVENOUS
  Filled 2014-07-14 (×7): qty 1000

## 2014-07-14 MED ORDER — POTASSIUM CHLORIDE 20 MEQ/15ML (10%) PO LIQD
40.0000 meq | ORAL | Status: AC
  Filled 2014-07-14: qty 30

## 2014-07-14 NOTE — Progress Notes (Signed)
INITIAL NUTRITION ASSESSMENT  DOCUMENTATION CODES Per approved criteria  -Not Applicable   INTERVENTION: - Ensure Complete BID - RD to continue to monitor   NUTRITION DIAGNOSIS: Inadequate oral intake related to drug overdose as evidenced by 0% meal intake.   Goal: Pt to consume >90% of meals/supplements  Monitor:  Weights, labs, intake   Reason for Assessment: Malnutrition screening tool   58 y.o. female  Admitting Dx: Drug overdose  ASSESSMENT: Pt with past medical history of schizoaffective disorder, anxiety and depression, history of suicide attempt with Xanax overdose who presented to [facility] ED 07/13/2014 after an intention to harm herself with ingesting multiple pills of clonazepam and nefazodone on the morning of this admission.   - Pt alone in room with sitter, acting restless, yelling out - Only information that RD could obtain is that pt has had poor appetite and lost 40 pounds over the past 3 years - Said she didn't want to eat anything right now   Potassium low, getting IV and oral replacement    Height: Ht Readings from Last 1 Encounters:  07/13/14 5\' 1"  (1.549 m)    Weight: Wt Readings from Last 1 Encounters:  07/14/14 124 lb 1.9 oz (56.3 kg)    Ideal Body Weight: 105 lbs   % Ideal Body Weight: 118%  Wt Readings from Last 10 Encounters:  07/14/14 124 lb 1.9 oz (56.3 kg)  05/13/13 124 lb (56.246 kg)  05/13/13 124 lb (56.246 kg)    Usual Body Weight: 164 lbs 3 years ago per t  % Usual Body Weight: 76%  BMI:  Body mass index is 23.46 kg/(m^2).  Estimated Nutritional Needs: Kcal: 1400-1600 Protein: 55-70g Fluid: 1.4-1.6L/day   Skin: Intact   Diet Order: General  EDUCATION NEEDS: -No education needs identified at this time   Intake/Output Summary (Last 24 hours) at 07/14/14 1318 Last data filed at 07/14/14 1200  Gross per 24 hour  Intake   3020 ml  Output   1325 ml  Net   1695 ml    Last BM: 7/21, diarrhea   Labs:   Recent  Labs Lab 07/13/14 1259  07/13/14 1415 07/13/14 1423 07/13/14 1853 07/14/14 0322  NA  --   < > 145 145 145 147  K  --   < > 2.3* 2.1* 2.8* 2.7*  CL  --   < > 110 109 114* 119*  CO2  --   --  21  --  18* 17*  BUN  --   < > 15 13 11 9   CREATININE  --   < > 0.81 0.80 0.75 0.75  CALCIUM  --   --  9.1  --  7.2* 7.5*  MG 2.2  --   --   --  1.6  --   PHOS  --   --   --   --  2.5  --   GLUCOSE  --   < > 127* 122* 147* 100*  < > = values in this interval not displayed.  CBG (last 3)   Recent Labs  07/13/14 1430 07/14/14 0750  GLUCAP 89 89    Scheduled Meds: . sodium chloride  3 mL Intravenous Q12H    Continuous Infusions: . 0.9 % NaCl with KCl 40 mEq / L 75 mL/hr (07/14/14 1101)    Past Medical History  Diagnosis Date  . History of suicide attempt 2012--  XANAX OVERDOSE  . Schizoaffective disorder   . Migraine   . Depression   .  Carpal tunnel syndrome of left wrist   . Cubital tunnel syndrome on left   . Anxiety     Past Surgical History  Procedure Laterality Date  . Cervical biopsy  w/ loop electrode excision  12-01-2008    CIN II  . Femoral hernia repair Left 10-31-2009    AND INGUINAL LYMPH NODE BX  . Lumbar disc surgery  X3   LAST ONE 1989    FUSION  . Repair partial finger amputation  1989  . Carpal tunnel release Left 05/13/2013    Procedure: CARPAL TUNNEL RELEASE ENDOSCOPIC;  Surgeon: Jolyn Nap, MD;  Location: [facility];  Service: Orthopedics;  Laterality: Left;  Incision at 0955.   Marland Kitchen Nerve repair Left 05/13/2013    Procedure: ULNAR NEUROPLASTY AT ELBOW;  Surgeon: Jolyn Nap, MD;  Location: [facility];  Service: Orthopedics;  Laterality: Left;    Carlis Stable MS, [place], [place] Pager [phone] Weekend/After Hours Pager

## 2014-07-14 NOTE — Progress Notes (Signed)
Patient has tried to void twice on the [facility]. Unable to void urine. Patient refusing bladder scan and In & Out Cath.
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
## 2014-07-14 NOTE — Progress Notes (Addendum)
Patient ID: [name], female   DOB: 1956-03-17, 58 y.o.   MRN: [id number] [facility] PROGRESS NOTE  PRIYA MATSEN DXI:[id number] DOB: 05/21/1956 DOA: 07/13/2014 PCP: No primary provider on file.  Brief narrative: 58 year old female with past medical history of schizoaffective disorder, anxiety and depression, history of suicide attempt with Xanax overdose who presented to [facility] ED 07/13/2014 after an intention to harm herself with ingesting multiple pills of clonazepam and nefazodone on the morning of this admission. In ED, BP was 65/51, HR 54-89, T max 98.1 F and oxygen saturation of 88% on room air. BP improved to 116/61 with IV fluids, oxygen saturation improved with [place] oxygen support. Blood work showed hypokalemia of 2.3 which was repleted. CXR was unremarkable.   Assessment & Plan    Principal Problem:  Drug overdose / suicidal ideations   Continue having sitter at the bedside  Psych consulted, followup on consult and recommendations  Patient is medically stable to transfer out of step down unit.  Initial EKG was normal, normal sinus rhythm  Since blood pressure is stable at this time patient can have Ativan 1 mg as needed every 6 hours for anxiety or agitation  Active Problems:  Acute respiratory failure with hypoxia   Likely secondary to drug overdose. Respiratory status remains stable. Hypokalemia   Unclear ideology. Being repleted by mouth and to IV fluids. Followup BMP in the morning  Check magnesium level Hypotension, unspecified   Likely secondary to overdose. Blood pressure is now stable. Thrombocytopenia/ Anemia  Possibly from drug overdose  Platelet count stable at 124,126  Using SCDs for DVT prophylaxis  Hemoglobin drop noted from 12.6 to 9.7 but remains stable at 9.7 Metabolic acidosis  Likely from drug overdose. Continue IV fluids. Followup BMP in the morning  DVT prophylaxis: SCD's bialterally  Code Status: full code  Family  Communication: plan of care discussed with the patient Disposition Plan: transfer out of step down unit. [name] nurse is aware of the transfer. Order is in place.  Leisa Lenz, MD  [facility] Pager [phone]  If 7PM-7AM, please contact night-coverage [email or web address] Password [email or web address] 07/14/2014, 10:34 AM   LOS: 1 day   Consultants:  Psychiatry  Procedures:  None  Antibiotics:  None  HPI/Subjective: No acute overnight events.  Objective: Filed Vitals:   07/14/14 0140 07/14/14 0342 07/14/14 0417 07/14/14 0800  BP:  116/66  131/72  Pulse: 53 54  53  Temp:  98 F (36.7 C)    TempSrc:  Oral    Resp: 14 17  16   Height:      Weight:   56.3 kg (124 lb 1.9 oz)   SpO2: 95% 95%  93%    Intake/Output Summary (Last 24 hours) at 07/14/14 1034 Last data filed at 07/14/14 0600  Gross per 24 hour  Intake   2570 ml  Output    825 ml  Net   1745 ml    Exam:   General:  Pt is alert, follows commands appropriately, not in acute distress  Cardiovascular: Rbradycardic, S1/S2, no murmurs  Respiratory: Clear to auscultation bilaterally, no wheezing, no crackles, no rhonchi  Abdomen: Soft, non tender, non distended, bowel sounds present  Extremities: No edema, pulses DP and PT palpable bilaterally  Neuro: Grossly nonfocal  Data Reviewed: Basic Metabolic Panel:  Recent Labs Lab 07/13/14 1259 07/13/14 1415 07/13/14 1423 07/13/14 1853 07/14/14 0322  NA  --  145 145 145 147  K  --  2.3*  2.1* 2.8* 2.7*  CL  --  110 109 114* 119*  CO2  --  21  --  18* 17*  GLUCOSE  --  127* 122* 147* 100*  BUN  --  15 13 11 9   CREATININE  --  0.81 0.80 0.75 0.75  CALCIUM  --  9.1  --  7.2* 7.5*  MG 2.2  --   --  1.6  --   PHOS  --   --   --  2.5  --    Liver Function Tests:  Recent Labs Lab 07/13/14 1415 07/13/14 1853 07/14/14 0322  AST 12 21 16   ALT 6 10 8   ALKPHOS 87 67 66  BILITOT 0.3 <0.2* 0.2*  PROT 6.1 4.7* 4.7*  ALBUMIN 3.7 2.8* 2.8*   No results  found for this basename: LIPASE, AMYLASE,  in the last 168 hours No results found for this basename: AMMONIA,  in the last 168 hours CBC:  Recent Labs Lab 07/13/14 1415 07/13/14 1423 07/13/14 1853 07/14/14 0322  WBC 5.6  --  4.0 4.8  NEUTROABS 2.6  --  2.4  --   HGB 11.7* 12.6 9.7* 9.7*  HCT 33.7* 37.0 28.2* 28.0*  MCV 95.7  --  96.6 96.2  PLT 175  --  124* 126*   Cardiac Enzymes:  Recent Labs Lab 07/13/14 1415  TROPONINI <0.30   BNP: No components found with this basename: POCBNP,  CBG:  Recent Labs Lab 07/13/14 1430 07/14/14 0750  GLUCAP 89 89    Recent Results (from the past 240 hour(s))  MRSA PCR SCREENING     Status: None   Collection Time    07/13/14  5:21 PM      Result Value Ref Range Status   MRSA by PCR NEGATIVE  NEGATIVE Final   Comment:            The GeneXpert MRSA Assay (FDA     approved for NASAL specimens     only), is one component of a     comprehensive MRSA colonization     surveillance program. It is not     intended to diagnose MRSA     infection nor to guide or     monitor treatment for     MRSA infections.     Studies: Dg Chest Port 1 View  07/13/2014   CLINICAL DATA:  Possible infiltrate  EXAM: PORTABLE CHEST - 1 VIEW  COMPARISON:  None.  FINDINGS: Cardiomediastinal silhouette is unremarkable. No acute infiltrate or pleural effusion. No pulmonary edema. Bony thorax is unremarkable.  IMPRESSION: No active disease.   Electronically Signed   By: Lahoma Crocker M.D.   On: 07/13/2014 17:53    Scheduled Meds: . potassium chloride  40 mEq Oral Q2H  . sodium chloride  3 mL Intravenous Q12H   Continuous Infusions: . 0.9 % NaCl with KCl 40 mEq / L

## 2014-07-14 NOTE — Progress Notes (Signed)
Pt ex-husband called to get updates on pt, pt gave a verbal consent to discuss her care with her ex-husband. Ex-husband told me that he is the POA to pt and would want to be informed before pt is progressed out of the unit, ex-husband was asked to bring POA documentation and he agreed to bring it later today.

## 2014-07-14 NOTE — Progress Notes (Signed)
CRITICAL VALUE ALERT  Critical value received:  K+ 2.7  Date of notification:  07/14/2014   Time of notification:  4:21 AM   Critical value read back:Yes.    Nurse who received alert:  Beacher May, RN  MD notified (1st page): Princella Ion, MD  Time of first page:  4:21 AM   MD notified (2nd page):  Time of second page:  Responding MD:  Princella Ion, MD  Time MD responded:  0430  New orders received.

## 2014-07-14 NOTE — Progress Notes (Signed)
CARE MANAGEMENT NOTE 07/14/2014  Patient:  JOLIN, BENAVIDES   Account Number:  [id number]  Date Initiated:  07/14/2014  Documentation initiated by:  DAVIS,RHONDA  Subjective/Objective Assessment:   intential durg overdose but is in contact with sister and ex-husband hx of psych issues     Action/Plan:   tbd by psych   Anticipated DC Date:  07/17/2014   Anticipated DC Plan:  HOME/SELF CARE  In-house referral  Clinical Social Worker      DC Planning Services  NA      [facility] Choice  NA   Choice offered to / List presented to:  NA   DME arranged  NA      DME agency  NA     [place] arranged  NA      [place] agency  NA   Status of service:  In process, will continue to follow Medicare Important Message given?  NA - LOS <3 / Initial given by admissions (If response is "NO", the following Medicare IM given date fields will be blank) Date Medicare IM given:   Medicare IM given by:   Date Additional Medicare IM given:   Additional Medicare IM given by:    Discharge Disposition:    Per UR Regulation:  Reviewed for med. necessity/level of care/duration of stay  If discussed at [place] of Stay Meetings, dates discussed:    Comments:  [id number]/Rhonda Eldridge Dace, BSN, [place] [phone] Chart Reviewed for discharge and hospital needs. Discharge needs at time of review: None present will follow for needs. Review of patient progress due on [id number].

## 2014-07-14 NOTE — Progress Notes (Signed)
Patient transferring to room 1503.  Report called to Maudie Mercury, Therapist, sports.  Patient to transport by bed.  Will continue to monitor.

## 2014-07-15 DIAGNOSIS — F332 Major depressive disorder, recurrent severe without psychotic features: Secondary | ICD-10-CM

## 2014-07-15 DIAGNOSIS — IMO0002 Reserved for concepts with insufficient information to code with codable children: Secondary | ICD-10-CM

## 2014-07-15 LAB — BASIC METABOLIC PANEL
Anion gap: 10 (ref 5–15)
BUN: 5 mg/dL — AB (ref 6–23)
CHLORIDE: 116 meq/L — AB (ref 96–112)
CO2: 18 meq/L — AB (ref 19–32)
Calcium: 8.4 mg/dL (ref 8.4–10.5)
Creatinine, Ser: 0.69 mg/dL (ref 0.50–1.10)
GFR calc Af Amer: >90 mL/min (ref >90)
Glucose, Bld: 101 mg/dL — ABNORMAL HIGH (ref 70–99)
POTASSIUM: 3 meq/L — AB (ref 3.7–5.3)
Sodium: 144 mEq/L (ref 137–147)

## 2014-07-15 LAB — POTASSIUM: Potassium: 2.9 mEq/L — CL (ref 3.7–5.3)

## 2014-07-15 LAB — CBC
HEMATOCRIT: 29 % — AB (ref 36.0–46.0)
Hemoglobin: 10.2 g/dL — ABNORMAL LOW (ref 12.0–15.0)
MCH: 34.1 pg — ABNORMAL HIGH (ref 26.0–34.0)
MCHC: 35.2 g/dL (ref 30.0–36.0)
MCV: 97 fL (ref 78.0–100.0)
Platelets: 129 10*3/uL — ABNORMAL LOW (ref 150–400)
RBC: 2.99 MIL/uL — ABNORMAL LOW (ref 3.87–5.11)
RDW: 14.8 % (ref 11.5–15.5)
WBC: 6 10*3/uL (ref 4.0–10.5)

## 2014-07-15 LAB — GLUCOSE, CAPILLARY: Glucose-Capillary: 71 mg/dL (ref 70–99)

## 2014-07-15 MED ORDER — CHLORDIAZEPOXIDE HCL 25 MG PO CAPS
25.0000 mg | ORAL_CAPSULE | Freq: Three times a day (TID) | ORAL | Status: DC
  Administered 2014-07-16 – 2014-07-19 (×10): 25 mg via ORAL
  Filled 2014-07-15 (×10): qty 1

## 2014-07-15 MED ORDER — LORAZEPAM 2 MG/ML IJ SOLN
1.0000 mg | INTRAMUSCULAR | Status: DC | PRN
  Administered 2014-07-15 – 2014-07-16 (×4): 2 mg via INTRAVENOUS
  Filled 2014-07-15 (×4): qty 1

## 2014-07-15 MED ORDER — RISPERIDONE 0.5 MG PO TABS
0.5000 mg | ORAL_TABLET | Freq: Two times a day (BID) | ORAL | Status: DC
  Administered 2014-07-16 – 2014-07-19 (×6): 0.5 mg via ORAL
  Filled 2014-07-15 (×10): qty 1

## 2014-07-15 MED ORDER — HALOPERIDOL LACTATE 5 MG/ML IJ SOLN
2.0000 mg | Freq: Once | INTRAMUSCULAR | Status: AC
  Administered 2014-07-15: 2 mg via INTRAVENOUS
  Filled 2014-07-15: qty 1

## 2014-07-15 MED ORDER — RISPERIDONE 0.25 MG PO TABS
0.2500 mg | ORAL_TABLET | Freq: Two times a day (BID) | ORAL | Status: DC
  Filled 2014-07-15 (×2): qty 1

## 2014-07-15 MED ORDER — POTASSIUM CHLORIDE 10 MEQ/100ML IV SOLN
10.0000 meq | INTRAVENOUS | Status: AC
  Administered 2014-07-15 (×4): 10 meq via INTRAVENOUS
  Filled 2014-07-15 (×4): qty 100

## 2014-07-15 MED ORDER — HALOPERIDOL LACTATE 5 MG/ML IJ SOLN
2.0000 mg | Freq: Four times a day (QID) | INTRAMUSCULAR | Status: DC | PRN
  Administered 2014-07-15 – 2014-07-16 (×4): 2 mg via INTRAVENOUS
  Filled 2014-07-15 (×4): qty 1

## 2014-07-15 MED ORDER — LORAZEPAM 2 MG/ML IJ SOLN
1.0000 mg | Freq: Four times a day (QID) | INTRAMUSCULAR | Status: DC
  Administered 2014-07-15 – 2014-07-16 (×4): 1 mg via INTRAVENOUS
  Filled 2014-07-15 (×4): qty 1
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
## 2014-07-15 NOTE — Progress Notes (Signed)
CRITICAL VALUE ALERT  Critical value received:  Potassium  Date of notification:  07/23  Time of notification:  5615  Critical value read back:Yes.    Nurse who received alert:  Durwin Nora RN  MD notified (1st page):  Charlies Silvers  Time of first page:  53  MD notified (2nd page):  Time of second page:  Responding MD:  Charlies Silvers  Time MD responded:  [phone]
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
## 2014-07-15 NOTE — Progress Notes (Addendum)
Patient ID: [name], female   DOB: 08/06/1956, 58 y.o.   MRN: [id number] [facility] PROGRESS NOTE  [name] YIR:[id number] DOB: 02-Mar-1956 DOA: 07/13/2014 PCP: No primary provider on file.  Brief narrative: 58 year old female with past medical history of schizoaffective disorder, anxiety and depression, history of suicide attempt with Xanax overdose who presented to [facility] ED 07/13/2014 after an intention to harm herself with ingesting multiple pills of clonazepam and nefazodone on the morning of this admission.  In ED, BP was 65/51, HR 54-89, T max 98.1 F and oxygen saturation of 88% on room air. BP improved to 116/61 with IV fluids, oxygen saturation improved with  oxygen support. Blood work showed hypokalemia of 2.3 which was repleted. CXR was unremarkable.   Assessment & Plan    Principal Problem:  Drug overdose / suicidal ideations  Continue having sitter at the bedside  Pt combative, agitated; changed ativan to scheduled every 6 hours and then every 4 hours as needed; added haldol as needed. May need precedex if still agitated Initial EKG showed normal sinus rhythm   Active Problems:  Acute respiratory failure with hypoxia  Likely secondary to drug overdose. Respiratory status remains stable. Hypokalemia  Unclear ideology. Being repleted by mouth and added to IV fluids. Magnesium was 1.7 and supplemented. Hypotension, unspecified  Likely secondary to overdose. Blood pressure is now stable. Thrombocytopenia/ Anemia  Possibly from drug overdose  Platelet count stable at 124,126  Using SCDs for DVT prophylaxis  Hemoglobin drop noted from 12.6 to 9.7 but remains stable at 9.7 Metabolic acidosis  Likely from drug overdose. Continue IV fluids for next 24 hours.   DVT prophylaxis: SCD's bialterally    Code Status: full code  Family Communication: plan of care discussed with the patient  Disposition Plan: to [facility] per psychiatry recommendations once medically stable    Consultants:  Psychiatry Procedures:  None Antibiotics:  None   Leisa Lenz, MD  [facility] Pager [phone]  If 7PM-7AM, please contact night-coverage [email or web address] Password [email or web address] 07/15/2014, 7:00 AM   LOS: 2 days    HPI/Subjective: No acute overnight events.  Objective: Filed Vitals:   07/14/14 0800 07/14/14 1200 07/14/14 1748 07/15/14 0635  BP: 131/72 137/67 126/65 126/62  Pulse: 53 65 55 52  Temp: 98.3 F (36.8 C)  97.8 F (36.6 C) 98.1 F (36.7 C)  TempSrc: Oral  Oral Axillary  Resp: 16 22 18    Height:      Weight:      SpO2: 93% 100% 96%     Intake/Output Summary (Last 24 hours) at 07/15/14 0700 Last data filed at 07/15/14 8299  Gross per 24 hour  Intake 1580.42 ml  Output    500 ml  Net 1080.42 ml    Exam:   General:  Pt is alert, combative, agitated  Cardiovascular: Regular rate and rhythm, S1/S2, no murmurs  Respiratory: Clear to auscultation bilaterally, no wheezing, no crackles, no rhonchi  Abdomen: Soft, non tender, non distended, bowel sounds present  Extremities: No edema, pulses DP and PT palpable bilaterally  Neuro: Grossly nonfocal  Data Reviewed: Basic Metabolic Panel:  Recent Labs Lab 07/13/14 1259 07/13/14 1415 07/13/14 1423 07/13/14 1853 07/14/14 0322 07/14/14 0337  NA  --  145 145 145 147  --   K  --  2.3* 2.1* 2.8* 2.7*  --   CL  --  110 109 114* 119*  --   CO2  --  21  --  18* 17*  --  GLUCOSE  --  127* 122* 147* 100*  --   BUN  --  15 13 11 9   --   CREATININE  --  0.81 0.80 0.75 0.75  --   CALCIUM  --  9.1  --  7.2* 7.5*  --   MG 2.2  --   --  1.6  --  1.7  PHOS  --   --   --  2.5  --   --    Liver Function Tests:  Recent Labs Lab 07/13/14 1415 07/13/14 1853 07/14/14 0322  AST 12 21 16   ALT 6 10 8   ALKPHOS 87 67 66  BILITOT 0.3 <0.2* 0.2*  PROT 6.1 4.7* 4.7*  ALBUMIN 3.7 2.8* 2.8*   No results found for this basename: LIPASE, AMYLASE,  in the last 168 hours No results found for this  basename: AMMONIA,  in the last 168 hours CBC:  Recent Labs Lab 07/13/14 1415 07/13/14 1423 07/13/14 1853 07/14/14 0322  WBC 5.6  --  4.0 4.8  NEUTROABS 2.6  --  2.4  --   HGB 11.7* 12.6 9.7* 9.7*  HCT 33.7* 37.0 28.2* 28.0*  MCV 95.7  --  96.6 96.2  PLT 175  --  124* 126*   Cardiac Enzymes:  Recent Labs Lab 07/13/14 1415  TROPONINI <0.30   BNP: No components found with this basename: POCBNP,  CBG:  Recent Labs Lab 07/13/14 1430 07/14/14 0750  GLUCAP 89 89    Recent Results (from the past 240 hour(s))  MRSA PCR SCREENING     Status: None   Collection Time    07/13/14  5:21 PM      Result Value Ref Range Status   MRSA by PCR NEGATIVE  NEGATIVE Final   Comment:            The GeneXpert MRSA Assay (FDA     approved for NASAL specimens     only), is one component of a     comprehensive MRSA colonization     surveillance program. It is not     intended to diagnose MRSA     infection nor to guide or     monitor treatment for     MRSA infections.     Studies: Dg Chest Port 1 View  07/13/2014   CLINICAL DATA:  Possible infiltrate  EXAM: PORTABLE CHEST - 1 VIEW  COMPARISON:  None.  FINDINGS: Cardiomediastinal silhouette is unremarkable. No acute infiltrate or pleural effusion. No pulmonary edema. Bony thorax is unremarkable.  IMPRESSION: No active disease.   Electronically Signed   By: Lahoma Crocker M.D.   On: 07/13/2014 17:53    Scheduled Meds: . feeding supplement (ENSURE COMPLETE)  237 mL Oral BID BM  . risperiDONE  0.25 mg Oral BID  . sodium chloride  3 mL Intravenous Q12H   Continuous Infusions: . 0.9 % NaCl with KCl 40 mEq / L 50 mL/hr (07/14/14 2257)

## 2014-07-16 LAB — BASIC METABOLIC PANEL
Anion gap: 12 (ref 5–15)
BUN: 5 mg/dL — AB (ref 6–23)
CHLORIDE: 111 meq/L (ref 96–112)
CO2: 19 meq/L (ref 19–32)
Calcium: 8 mg/dL — ABNORMAL LOW (ref 8.4–10.5)
Creatinine, Ser: 0.74 mg/dL (ref 0.50–1.10)
GFR calc Af Amer: >90 mL/min (ref >90)
GFR calc non Af Amer: >90 mL/min (ref >90)
Glucose, Bld: 85 mg/dL (ref 70–99)
Potassium: 2.9 mEq/L — CL (ref 3.7–5.3)
Sodium: 142 mEq/L (ref 137–147)

## 2014-07-16 LAB — CBC
HEMATOCRIT: 28.1 % — AB (ref 36.0–46.0)
HEMOGLOBIN: 9.8 g/dL — AB (ref 12.0–15.0)
MCH: 34.1 pg — ABNORMAL HIGH (ref 26.0–34.0)
MCHC: 34.9 g/dL (ref 30.0–36.0)
MCV: 97.9 fL (ref 78.0–100.0)
Platelets: 127 10*3/uL — ABNORMAL LOW (ref 150–400)
RBC: 2.87 MIL/uL — AB (ref 3.87–5.11)
RDW: 14.6 % (ref 11.5–15.5)
WBC: 9.1 10*3/uL (ref 4.0–10.5)

## 2014-07-16 LAB — GLUCOSE, CAPILLARY: Glucose-Capillary: 74 mg/dL (ref 70–99)

## 2014-07-16 MED ORDER — POTASSIUM CHLORIDE CRYS ER 20 MEQ PO TBCR
40.0000 meq | EXTENDED_RELEASE_TABLET | ORAL | Status: DC
  Filled 2014-07-16 (×2): qty 2

## 2014-07-16 MED ORDER — HALOPERIDOL LACTATE 5 MG/ML IJ SOLN
2.0000 mg | Freq: Four times a day (QID) | INTRAMUSCULAR | Status: DC | PRN
  Administered 2014-07-17 (×2): 2 mg via INTRAMUSCULAR
  Filled 2014-07-16 (×2): qty 1

## 2014-07-16 MED ORDER — LORAZEPAM 2 MG/ML IJ SOLN
1.0000 mg | INTRAMUSCULAR | Status: DC | PRN
Start: 2014-07-16 — End: 2014-07-19
  Administered 2014-07-17 (×2): 2 mg via INTRAMUSCULAR
  Filled 2014-07-16 (×2): qty 1

## 2014-07-16 MED ORDER — POTASSIUM CHLORIDE 10 MEQ/100ML IV SOLN
10.0000 meq | INTRAVENOUS | Status: AC
  Administered 2014-07-16 (×3): 10 meq via INTRAVENOUS
  Filled 2014-07-16 (×3): qty 100

## 2014-07-16 NOTE — Progress Notes (Signed)
CRITICAL VALUE ALERT  Critical value received:  K 2.9  Date of notification: 07/16/14  Time of notification:  7680  Critical value read back:yes  Nurse who received alert:  Jearld Shines RN  MD notified (1st page):  Dr. Renard Hamper  Time of first page:  [phone]  MD notified (2nd page):  Time of second page:  Responding MD:  Dr Renard Hamper  Time MD responded:  [phone]

## 2014-07-16 NOTE — Progress Notes (Signed)
Clinical Social Work  Per MD, patient is not medically stable to DC today. MD signed IVC forms which were faxed to [place]. CSW called Magistrate to confirm IVC forms were received and Magistrate reports [employer] will serve patient today.   CSW will continue to follow.  [place], [place] [phone]
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
## 2014-07-16 NOTE — Care Management Note (Unsigned)
    Page 1 of 1   07/19/2014     2:28:33 PM CARE MANAGEMENT NOTE 07/19/2014  Patient:  [name], [name]   Account Number:  [id number]  Date Initiated:  07/14/2014  Documentation initiated by:  DAVIS,RHONDA  Subjective/Objective Assessment:   intential durg overdose but is in contact with sister and ex-husband hx of psych issues     Action/Plan:   tbd by psych   Anticipated DC Date:  07/19/2014   Anticipated DC Plan:  [place] referral  Clinical Social Worker      DC Planning Services  CM consult      Choice offered to / List presented to:             Status of service:  Completed, signed off Medicare Important Message given?  YES (If response is "NO", the following Medicare IM given date fields will be blank) Date Medicare IM given:  07/16/2014 Medicare IM given by:  [facility] Date Additional Medicare IM given:  07/19/2014 Additional Medicare IM given by:  Allene Dillon  Discharge Disposition:  [place]  Per UR Regulation:  Reviewed for med. necessity/level of care/duration of stay  If discussed at [place] of Stay Meetings, dates discussed:    Comments:

## 2014-07-16 NOTE — Progress Notes (Addendum)
Patient ID: [name], female   DOB: December 21, 1956, 58 y.o.   MRN: [id number] [facility] PROGRESS NOTE  WHITNI PASQUINI HKV:[id number] DOB: 1956/08/08 DOA: 07/13/2014 PCP: No primary provider on file.  Brief narrative: 58 year old female with past medical history of schizoaffective disorder, anxiety and depression, history of suicide attempt with Xanax overdose who presented to [facility] ED 07/13/2014 after an intention to harm herself with ingesting multiple pills of clonazepam and nefazodone on the morning of this admission.  In ED, BP was 65/51, HR 54-89, T max 98.1 F and oxygen saturation of 88% on room air. BP improved to 116/61 with IV fluids, oxygen saturation improved with [place] oxygen support. Blood work showed hypokalemia of 2.3 which was repleted. CXR was unremarkable. EKG on admission showed NSR. Hospital curse is complicated with ongoing agitation.  Assessment & Plan    Principal Problem:  Drug overdose / suicidal ideations  Continue having sitter at the bedside  Continue ativan scheduled and as needed  Active Problems:  Acute respiratory failure with hypoxia  Likely secondary to drug overdose. Respiratory status stable. Hypokalemia  Unclear ideology. Being repleted by mouth and added to IV fluids. Magnesium was 1.7 and supplemented. Hypotension, unspecified  Likely secondary to overdose. Blood pressure is stable.  Thrombocytopenia/ Anemia  Possibly from drug overdose  Platelet count stable at 124,126  Using SCDs for DVT prophylaxis  Hemoglobin drop noted from 12.6 to 9.7 but remains stable at 9.7 Metabolic acidosis  Likely from drug overdose. Resolved with IV fluids.    DVT prophylaxis: SCD's bialterally   Code Status: full code  Family Communication: plan of care discussed with the patient  Disposition Plan: to [facility] per psychiatry recommendations once medically stable   Consultants:  Psychiatry Procedures:  None Antibiotics:  None   Code Status: full code   Family Communication: plan of care discussed with the patient Disposition Plan: home when stable   Leisa Lenz, MD  [facility] Pager [phone]  If 7PM-7AM, please contact night-coverage [email or web address] Password [facility] 07/16/2014, 9:44 AM   LOS: 3 days    HPI/Subjective: No acute overnight events.  Objective: Filed Vitals:   07/15/14 0635 07/15/14 1452 07/15/14 2200 07/16/14 0700  BP: 126/62 153/64 134/72 122/70  Pulse: 52 56 70 67  Temp: 98.1 F (36.7 C) 99.7 F (37.6 C) 99.2 F (37.3 C) 99 F (37.2 C)  TempSrc: Axillary Oral Oral Oral  Resp:  18 20 20   Height:      Weight:      SpO2:  95% 96% 98%    Intake/Output Summary (Last 24 hours) at 07/16/14 0944 Last data filed at 07/16/14 0616  Gross per 24 hour  Intake 1656.66 ml  Output    350 ml  Net 1306.66 ml    Exam:   General:  Pt is alert, combative, agitated   Cardiovascular: Regular rate and rhythm, S1/S2, no murmurs  Respiratory: Clear to auscultation bilaterally, no wheezing, no crackles, no rhonchi  Abdomen: Soft, non tender, non distended, bowel sounds present  Extremities: No edema, pulses DP and PT palpable bilaterally  Neuro: Grossly nonfocal  Data Reviewed: Basic Metabolic Panel:  Recent Labs Lab 07/13/14 1259  07/13/14 1415 07/13/14 1423 07/13/14 1853 07/14/14 0322 07/14/14 0337 07/15/14 0630 07/15/14 1203 07/16/14 0443  NA  --   < > 145 145 145 147  --  144  --  142  K  --   < > 2.3* 2.1* 2.8* 2.7*  --  3.0*  2.9* 2.9*  CL  --   < > 110 109 114* 119*  --  116*  --  111  CO2  --   --  21  --  18* 17*  --  18*  --  19  GLUCOSE  --   < > 127* 122* 147* 100*  --  101*  --  85  BUN  --   < > 15 13 11 9   --  5*  --  5*  CREATININE  --   < > 0.81 0.80 0.75 0.75  --  0.69  --  0.74  CALCIUM  --   --  9.1  --  7.2* 7.5*  --  8.4  --  8.0*  MG 2.2  --   --   --  1.6  --  1.7  --   --   --   PHOS  --   --   --   --  2.5  --   --   --   --   --   < > = values in this interval not  displayed. Liver Function Tests:  Recent Labs Lab 07/13/14 1415 07/13/14 1853 07/14/14 0322  AST 12 21 16   ALT 6 10 8   ALKPHOS 87 67 66  BILITOT 0.3 <0.2* 0.2*  PROT 6.1 4.7* 4.7*  ALBUMIN 3.7 2.8* 2.8*   No results found for this basename: LIPASE, AMYLASE,  in the last 168 hours No results found for this basename: AMMONIA,  in the last 168 hours CBC:  Recent Labs Lab 07/13/14 1415 07/13/14 1423 07/13/14 1853 07/14/14 0322 07/15/14 0630 07/16/14 0443  WBC 5.6  --  4.0 4.8 6.0 9.1  NEUTROABS 2.6  --  2.4  --   --   --   HGB 11.7* 12.6 9.7* 9.7* 10.2* 9.8*  HCT 33.7* 37.0 28.2* 28.0* 29.0* 28.1*  MCV 95.7  --  96.6 96.2 97.0 97.9  PLT 175  --  124* 126* 129* 127*   Cardiac Enzymes:  Recent Labs Lab 07/13/14 1415  TROPONINI <0.30   BNP: No components found with this basename: POCBNP,  CBG:  Recent Labs Lab 07/13/14 1430 07/14/14 0750 07/15/14 0753 07/16/14 0726  GLUCAP 89 89 71 74    Recent Results (from the past 240 hour(s))  MRSA PCR SCREENING     Status: None   Collection Time    07/13/14  5:21 PM      Result Value Ref Range Status   MRSA by PCR NEGATIVE  NEGATIVE Final   Comment:            The GeneXpert MRSA Assay (FDA     approved for NASAL specimens     only), is one component of a     comprehensive MRSA colonization     surveillance program. It is not     intended to diagnose MRSA     infection nor to guide or     monitor treatment for     MRSA infections.     Studies: No results found.  Scheduled Meds: . chlordiazePOXIDE  25 mg Oral TID  . feeding supplement (ENSURE COMPLETE)  237 mL Oral BID BM  . LORazepam  1 mg Intravenous Q6H  . risperiDONE  0.5 mg Oral BID  . sodium chloride  3 mL Intravenous Q12H   Continuous Infusions: . 0.9 % NaCl with KCl 40 mEq / L 50 mL/hr (07/15/14 1457)

## 2014-07-16 NOTE — Progress Notes (Signed)
Pt very hateful and not happy with anything. Pt keeps yelling to go home and to go to the bathroom. Have offered bedpan as pt can not stand. Pt would not let me put up her on the bedpan and just keeps yelling.

## 2014-07-16 NOTE — Progress Notes (Signed)
Clinical Social Work  [employer] served IVC paperwork which is valid until 07/23/14. CSW spoke with sister and explained that MD does not feel patient is medically stable to DC today. Sister reports understanding and reports she will visit with patient.   [place], [place] [phone]

## 2014-07-17 LAB — CBC
HCT: 33.1 % — ABNORMAL LOW (ref 36.0–46.0)
HEMOGLOBIN: 11.6 g/dL — AB (ref 12.0–15.0)
MCH: 33.7 pg (ref 26.0–34.0)
MCHC: 35 g/dL (ref 30.0–36.0)
MCV: 96.2 fL (ref 78.0–100.0)
Platelets: 161 10*3/uL (ref 150–400)
RBC: 3.44 MIL/uL — AB (ref 3.87–5.11)
RDW: 14.1 % (ref 11.5–15.5)
WBC: 9.7 10*3/uL (ref 4.0–10.5)

## 2014-07-17 LAB — GLUCOSE, CAPILLARY: Glucose-Capillary: 72 mg/dL (ref 70–99)

## 2014-07-17 LAB — BASIC METABOLIC PANEL
Anion gap: 15 (ref 5–15)
BUN: 6 mg/dL (ref 6–23)
CALCIUM: 7.9 mg/dL — AB (ref 8.4–10.5)
CHLORIDE: 107 meq/L (ref 96–112)
CO2: 14 meq/L — AB (ref 19–32)
Creatinine, Ser: 0.47 mg/dL — ABNORMAL LOW (ref 0.50–1.10)
GFR calc Af Amer: >90 mL/min (ref >90)
GFR calc non Af Amer: >90 mL/min (ref >90)
GLUCOSE: 88 mg/dL (ref 70–99)
Potassium: 4.7 mEq/L (ref 3.7–5.3)
SODIUM: 136 meq/L — AB (ref 137–147)

## 2014-07-17 MED ORDER — LORAZEPAM 2 MG/ML IJ SOLN
2.0000 mg | Freq: Once | INTRAMUSCULAR | Status: AC
  Administered 2014-07-17: 2 mg via INTRAMUSCULAR
  Filled 2014-07-17: qty 1

## 2014-07-17 MED ORDER — ZIPRASIDONE MESYLATE 20 MG IM SOLR
10.0000 mg | Freq: Once | INTRAMUSCULAR | Status: AC
  Administered 2014-07-17: 10 mg via INTRAMUSCULAR
  Filled 2014-07-17: qty 20

## 2014-07-17 NOTE — Progress Notes (Signed)
Per RN, Pt not ready for d/c today.  CSW to continue to follow.  Bernita Raisin, [place] Social Work [phone]

## 2014-07-17 NOTE — Progress Notes (Addendum)
Patient ID: [name], female   DOB: 10-Apr-1956, 58 y.o.   MRN: [id number] [facility] PROGRESS NOTE  [name] DZH:[id number] DOB: 10/27/1956 DOA: 07/13/2014 PCP: No primary provider on file.  Brief narrative: 58 year old female with past medical history of schizoaffective disorder, anxiety and depression, history of suicide attempt with Xanax overdose who presented to [facility] ED 07/13/2014 after an intention to harm herself with ingesting multiple pills of clonazepam and nefazodone on the morning of this admission. Her vitas were stable on admission. Her initial EKG showed sinus rhythm. Hospital course is complicated with ongoing agitation and combativeness  Assessment & Plan    Principal Problem:  Drug overdose / suicidal ideations  Continue having sitter at the bedside  Continue ativan scheduled and as needed; haldol if ativan not helpful Active Problems:  Acute respiratory failure with hypoxia  Likely secondary to drug overdose. Respiratory status stable. Hypokalemia  Unclear ideology. Was aggressively repleted. Now potassium WNL. Magnesium 1.7 Hypotension, unspecified  Likely secondary to overdose. Blood pressure is stable.  Thrombocytopenia/ Anemia  Possibly from drug overdose  Platelet count normalized  Using SCDs for DVT prophylaxis  Hemoglobin stable at 41.9 Metabolic acidosis  Likely from drug overdose. Resolved with IV fluids.    DVT prophylaxis: SCD's bialterally    Code Status: full code  Family Communication: plan of care discussed with the patient  Disposition Plan: to [facility] per psychiatry recommendations once medically stable    Consultants:  Psychiatry Procedures:  None Antibiotics:  None   Leisa Lenz, MD  [facility] Pager [phone]  If 7PM-7AM, please contact night-coverage [email or web address] Password [facility] 07/17/2014, 12:35 PM   LOS: 4 days    HPI/Subjective: No acute overnight events.  Objective: Filed Vitals:   07/15/14  1452 07/15/14 2200 07/16/14 0700 07/17/14 0516  BP:   122/70 130/69  Pulse: 56 70 67 79  Temp: 99.7 F (37.6 C) 99.2 F (37.3 C) 99 F (37.2 C) 98.6 F (37 C)  TempSrc: Oral Oral Oral Oral  Resp: 18 20 20 16   Height:      Weight:      SpO2: 95% 96% 98% 96%   No intake or output data in the 24 hours ending 07/17/14 1235  Exam:   General:  Pt is alert, agitated  Cardiovascular: Regular rate and rhythm, S1/S2, no murmurs  Respiratory: Clear to auscultation bilaterally, no wheezing, no crackles, no rhonchi  Abdomen: Soft, non tender, non distended, bowel sounds present  Extremities: No edema, pulses DP and PT palpable bilaterally  Neuro: Grossly nonfocal  Data Reviewed: Basic Metabolic Panel:  Recent Labs Lab 07/13/14 1259  07/13/14 1423 07/13/14 1853 07/14/14 0322 07/14/14 0337 07/15/14 0630 07/15/14 1203 07/16/14 0443 07/17/14 0732  NA  --   < > 145 145 147  --  144  --  142 136*  K  --   < > 2.1* 2.8* 2.7*  --  3.0* 2.9* 2.9* 4.7  CL  --   < > 109 114* 119*  --  116*  --  111 107  CO2  --   < >  --  18* 17*  --  18*  --  19 14*  GLUCOSE  --   < > 122* 147* 100*  --  101*  --  85 88  BUN  --   < > 13 11 9   --  5*  --  5* 6  CREATININE  --   < > 0.80 0.75 0.75  --  0.69  --  0.74 0.47*  CALCIUM  --   < >  --  7.2* 7.5*  --  8.4  --  8.0* 7.9*  MG 2.2  --   --  1.6  --  1.7  --   --   --   --   PHOS  --   --   --  2.5  --   --   --   --   --   --   < > = values in this interval not displayed. Liver Function Tests:  Recent Labs Lab 07/13/14 1415 07/13/14 1853 07/14/14 0322  AST 12 21 16   ALT 6 10 8   ALKPHOS 87 67 66  BILITOT 0.3 <0.2* 0.2*  PROT 6.1 4.7* 4.7*  ALBUMIN 3.7 2.8* 2.8*   No results found for this basename: LIPASE, AMYLASE,  in the last 168 hours No results found for this basename: AMMONIA,  in the last 168 hours CBC:  Recent Labs Lab 07/13/14 1415  07/13/14 1853 07/14/14 0322 07/15/14 0630 07/16/14 0443 07/17/14 0732  WBC 5.6   --  4.0 4.8 6.0 9.1 9.7  NEUTROABS 2.6  --  2.4  --   --   --   --   HGB 11.7*  < > 9.7* 9.7* 10.2* 9.8* 11.6*  HCT 33.7*  < > 28.2* 28.0* 29.0* 28.1* 33.1*  MCV 95.7  --  96.6 96.2 97.0 97.9 96.2  PLT 175  --  124* 126* 129* 127* 161  < > = values in this interval not displayed. Cardiac Enzymes:  Recent Labs Lab 07/13/14 1415  TROPONINI <0.30   BNP: No components found with this basename: POCBNP,  CBG:  Recent Labs Lab 07/13/14 1430 07/14/14 0750 07/15/14 0753 07/16/14 0726  GLUCAP 89 89 71 74    Recent Results (from the past 240 hour(s))  MRSA PCR SCREENING     Status: None   Collection Time    07/13/14  5:21 PM      Result Value Ref Range Status   MRSA by PCR NEGATIVE  NEGATIVE Final   Comment:            The GeneXpert MRSA Assay (FDA     approved for NASAL specimens     only), is one component of a     comprehensive MRSA colonization     surveillance program. It is not     intended to diagnose MRSA     infection nor to guide or     monitor treatment for     MRSA infections.     Studies: No results found.  Scheduled Meds: . chlordiazePOXIDE  25 mg Oral TID  . feeding supplement (ENSURE COMPLETE)  237 mL Oral BID BM  . risperiDONE  0.5 mg Oral BID  . sodium chloride  3 mL Intravenous Q12H  . ziprasidone  10 mg Intramuscular Once   Continuous Infusions: . 0.9 % NaCl with KCl 40 mEq / L 50 mL/hr (07/16/14 1210)

## 2014-07-18 LAB — URINALYSIS, ROUTINE W REFLEX MICROSCOPIC
BILIRUBIN URINE: NEGATIVE
Glucose, UA: NEGATIVE mg/dL
Ketones, ur: NEGATIVE mg/dL
Nitrite: POSITIVE — AB
Protein, ur: 30 mg/dL — AB
Specific Gravity, Urine: 1.023 (ref 1.005–1.030)
UROBILINOGEN UA: 0.2 mg/dL (ref 0.0–1.0)
pH: 6 (ref 5.0–8.0)

## 2014-07-18 LAB — BASIC METABOLIC PANEL
Anion gap: 18 — ABNORMAL HIGH (ref 5–15)
BUN: 7 mg/dL (ref 6–23)
CO2: 15 meq/L — AB (ref 19–32)
CREATININE: 0.59 mg/dL (ref 0.50–1.10)
Calcium: 8.6 mg/dL (ref 8.4–10.5)
Chloride: 108 mEq/L (ref 96–112)
GFR calc non Af Amer: >90 mL/min (ref >90)
GLUCOSE: 79 mg/dL (ref 70–99)
Potassium: 3.1 mEq/L — ABNORMAL LOW (ref 3.7–5.3)
Sodium: 141 mEq/L (ref 137–147)

## 2014-07-18 LAB — URINE MICROSCOPIC-ADD ON

## 2014-07-18 LAB — CBC
HCT: 35.4 % — ABNORMAL LOW (ref 36.0–46.0)
HEMOGLOBIN: 12.2 g/dL (ref 12.0–15.0)
MCH: 33.8 pg (ref 26.0–34.0)
MCHC: 34.5 g/dL (ref 30.0–36.0)
MCV: 98.1 fL (ref 78.0–100.0)
Platelets: 171 10*3/uL (ref 150–400)
RBC: 3.61 MIL/uL — ABNORMAL LOW (ref 3.87–5.11)
RDW: 14.4 % (ref 11.5–15.5)
WBC: 7.6 10*3/uL (ref 4.0–10.5)

## 2014-07-18 LAB — POTASSIUM: POTASSIUM: 2.8 meq/L — AB (ref 3.7–5.3)

## 2014-07-18 LAB — GLUCOSE, CAPILLARY: GLUCOSE-CAPILLARY: 103 mg/dL — AB (ref 70–99)

## 2014-07-18 MED ORDER — POTASSIUM CHLORIDE CRYS ER 20 MEQ PO TBCR
40.0000 meq | EXTENDED_RELEASE_TABLET | Freq: Two times a day (BID) | ORAL | Status: DC
  Administered 2014-07-18: 40 meq via ORAL
  Filled 2014-07-18 (×2): qty 2

## 2014-07-18 MED ORDER — POTASSIUM CHLORIDE CRYS ER 20 MEQ PO TBCR
40.0000 meq | EXTENDED_RELEASE_TABLET | Freq: Once | ORAL | Status: AC
  Administered 2014-07-18: 40 meq via ORAL
  Filled 2014-07-18: qty 2

## 2014-07-18 MED ORDER — POTASSIUM CHLORIDE CRYS ER 20 MEQ PO TBCR
40.0000 meq | EXTENDED_RELEASE_TABLET | ORAL | Status: AC
  Administered 2014-07-18: 40 meq via ORAL

## 2014-07-18 NOTE — Progress Notes (Signed)
Per Lyda Jester at [facility] [place] appropriate for a 400-hall bed; no beds available on that hall today.  MD aware.  Weekday CSW to follow.  Bernita Raisin, [place] Social Work [phone]

## 2014-07-18 NOTE — Progress Notes (Signed)
CRITICAL VALUE ALERT  Critical value received:  K 2.8  Date of notification:  07/20/14  Time of notification:  1800  Critical value read back: yes  Nurse who received alert:  A Santana Edell,RN  MD notified (1st page):  devine  Time of first page:  1800  MD notified (2nd page): no 2nd page  Time of second page: no 2nd page  Responding MD: Charlies Silvers  Time MD responded:  [phone]
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
## 2014-07-18 NOTE — Progress Notes (Signed)
Per MD, Pt likely ready for d/c today.  Lyda Jester, at [facility] notified.  Per [facility], no female beds available, at this time, but they are anticipating some d/c's today.  Bernita Raisin, [place] Social Work [phone]
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
## 2014-07-18 NOTE — Progress Notes (Signed)
Patient ID: [name], female   DOB: [birth year], 58 y.o.   MRN: [id number] [facility] PROGRESS NOTE  [name] IOE:[id number] DOB: September 27, 1956 DOA: 07/13/2014 PCP: No primary provider on file.  Brief narrative: 58 year old female with past medical history of schizoaffective disorder, anxiety and depression, history of suicide attempt with Xanax overdose who presented to [facility] ED 07/13/2014 after an intention to harm herself with ingesting multiple pills of clonazepam and nefazodone on the morning of this admission. Her vitas were stable on admission. Her initial EKG showed sinus rhythm. Hospital course is complicated with ongoing agitation and combativeness   Assessment & Plan   Principal Problem:  Drug overdose / suicidal ideations /MDD Continue sitter at bedside; suicide precaution Awaiting [facility] bed placement  Continue risperidone and librium Continue ativan and haldol as needed. Had to give 1 dose of geodon yesterday 7/25 due to severe agitation not improved with ativan and haldol. Active Problems:  Acute respiratory failure with hypoxia  Likely secondary to drug overdose. Respiratory status stable. Hypokalemia  Unclear ideology. Repleted. Follow up BMP later today and if WNL may transfer to [facility] if bed available.  Hypotension, unspecified  Likely secondary to overdose. Blood pressure is stable.  Thrombocytopenia/ Anemia  Possibly from drug overdose  Platelet count normalized; Hemoglobin also WNL. Using SCDs for DVT prophylaxis  Metabolic acidosis  Likely from drug overdose.  Slight improvement since yesterday, 14 --> 15.  DVT prophylaxis: SCD's bialterally   Code Status: full code  Family Communication: plan of care discussed with the patient  Disposition Plan: to [facility] once bed available  Consultants:  Psychiatry Procedures:  None Antibiotics:  None   Leisa Lenz, MD  [facility] Pager [phone]  If 7PM-7AM, please contact  night-coverage [email or web address] Password [facility] 07/18/2014, 1:37 PM   LOS: 5 days    HPI/Subjective: No acute overnight events.  Objective: Filed Vitals:   07/17/14 0516 07/17/14 1520 07/17/14 2048 07/18/14 0523  BP: 130/69 127/76 143/75 120/64  Pulse: 79 78 77 81  Temp: 98.6 F (37 C)  97.8 F (36.6 C) 98.4 F (36.9 C)  TempSrc: Oral Oral Oral Oral  Resp: 16 16 20 20   Height:      Weight:      SpO2: 96% 100% 100% 100%    Intake/Output Summary (Last 24 hours) at 07/18/14 1337 Last data filed at 07/17/14 1521  Gross per 24 hour  Intake      0 ml  Output      0 ml  Net      0 ml    Exam:   General:  Pt is sleeping this am, no distress  Cardiovascular: Regular rate and rhythm, S1/S2, no murmurs  Respiratory: Clear to auscultation bilaterally, no wheezing, no crackles, no rhonchi  Abdomen: Soft, non tender, non distended, bowel sounds present  Extremities: No edema, pulses DP and PT palpable bilaterally  Neuro: Grossly nonfocal  Data Reviewed: Basic Metabolic Panel:  Recent Labs Lab 07/13/14 1259  07/13/14 1423 07/13/14 1853 07/14/14 0322 07/14/14 0337 07/15/14 0630 07/15/14 1203 07/16/14 0443 07/17/14 0732 07/18/14 0500  NA  --   < > 145 145 147  --  144  --  142 136* 141  K  --   < > 2.1* 2.8* 2.7*  --  3.0* 2.9* 2.9* 4.7 3.1*  CL  --   < > 109 114* 119*  --  116*  --  111 107 108  CO2  --   < >  --  18* 17*  --  18*  --  19 14* 15*  GLUCOSE  --   < > 122* 147* 100*  --  101*  --  85 88 79  BUN  --   < > 13 11 9   --  5*  --  5* 6 7  CREATININE  --   < > 0.80 0.75 0.75  --  0.69  --  0.74 0.47* 0.59  CALCIUM  --   < >  --  7.2* 7.5*  --  8.4  --  8.0* 7.9* 8.6  MG 2.2  --   --  1.6  --  1.7  --   --   --   --   --   PHOS  --   --   --  2.5  --   --   --   --   --   --   --   < > = values in this interval not displayed. Liver Function Tests:  Recent Labs Lab 07/13/14 1415 07/13/14 1853 07/14/14 0322  AST 12 21 16   ALT 6 10 8   ALKPHOS 87 67 66   BILITOT 0.3 <0.2* 0.2*  PROT 6.1 4.7* 4.7*  ALBUMIN 3.7 2.8* 2.8*   No results found for this basename: LIPASE, AMYLASE,  in the last 168 hours No results found for this basename: AMMONIA,  in the last 168 hours CBC:  Recent Labs Lab 07/13/14 1415  07/13/14 1853 07/14/14 0322 07/15/14 0630 07/16/14 0443 07/17/14 0732 07/18/14 0500  WBC 5.6  --  4.0 4.8 6.0 9.1 9.7 7.6  NEUTROABS 2.6  --  2.4  --   --   --   --   --   HGB 11.7*  < > 9.7* 9.7* 10.2* 9.8* 11.6* 12.2  HCT 33.7*  < > 28.2* 28.0* 29.0* 28.1* 33.1* 35.4*  MCV 95.7  --  96.6 96.2 97.0 97.9 96.2 98.1  PLT 175  --  124* 126* 129* 127* 161 171  < > = values in this interval not displayed. Cardiac Enzymes:  Recent Labs Lab 07/13/14 1415  TROPONINI <0.30   BNP: No components found with this basename: POCBNP,  CBG:  Recent Labs Lab 07/14/14 0750 07/15/14 0753 07/16/14 0726 07/17/14 0811 07/18/14 0741  GLUCAP 89 71 74 72 103*    Recent Results (from the past 240 hour(s))  MRSA PCR SCREENING     Status: None   Collection Time    07/13/14  5:21 PM      Result Value Ref Range Status   MRSA by PCR NEGATIVE  NEGATIVE Final   Comment:            The GeneXpert MRSA Assay (FDA     approved for NASAL specimens     only), is one component of a     comprehensive MRSA colonization     surveillance program. It is not     intended to diagnose MRSA     infection nor to guide or     monitor treatment for     MRSA infections.     Studies: No results found.  Scheduled Meds: . chlordiazePOXIDE  25 mg Oral TID  . feeding supplement (ENSURE COMPLETE)  237 mL Oral BID BM  . potassium chloride  40 mEq Oral BID  . risperiDONE  0.5 mg Oral BID  . sodium chloride  3 mL Intravenous Q12H   Continuous Infusions:

## 2014-07-18 NOTE — Treatment Plan (Addendum)
Please call [phone].

## 2014-07-19 DIAGNOSIS — R45851 Suicidal ideations: Secondary | ICD-10-CM

## 2014-07-19 DIAGNOSIS — N390 Urinary tract infection, site not specified: Secondary | ICD-10-CM | POA: Diagnosis not present

## 2014-07-19 DIAGNOSIS — T50901S Poisoning by unspecified drugs, medicaments and biological substances, accidental (unintentional), sequela: Secondary | ICD-10-CM

## 2014-07-19 DIAGNOSIS — F259 Schizoaffective disorder, unspecified: Secondary | ICD-10-CM

## 2014-07-19 DIAGNOSIS — X838XXS Intentional self-harm by other specified means, sequela: Secondary | ICD-10-CM

## 2014-07-19 LAB — CBC
HCT: 31.1 % — ABNORMAL LOW (ref 36.0–46.0)
Hemoglobin: 11 g/dL — ABNORMAL LOW (ref 12.0–15.0)
MCH: 33.6 pg (ref 26.0–34.0)
MCHC: 35.4 g/dL (ref 30.0–36.0)
MCV: 95.1 fL (ref 78.0–100.0)
PLATELETS: 187 10*3/uL (ref 150–400)
RBC: 3.27 MIL/uL — AB (ref 3.87–5.11)
RDW: 14.5 % (ref 11.5–15.5)
WBC: 7.7 10*3/uL (ref 4.0–10.5)

## 2014-07-19 LAB — BASIC METABOLIC PANEL
ANION GAP: 11 (ref 5–15)
BUN: 10 mg/dL (ref 6–23)
CO2: 21 meq/L (ref 19–32)
CREATININE: 0.62 mg/dL (ref 0.50–1.10)
Calcium: 9 mg/dL (ref 8.4–10.5)
Chloride: 114 mEq/L — ABNORMAL HIGH (ref 96–112)
GFR calc non Af Amer: >90 mL/min (ref >90)
Glucose, Bld: 141 mg/dL — ABNORMAL HIGH (ref 70–99)
POTASSIUM: 3.9 meq/L (ref 3.7–5.3)
Sodium: 146 mEq/L (ref 137–147)

## 2014-07-19 LAB — GLUCOSE, CAPILLARY: Glucose-Capillary: 130 mg/dL — ABNORMAL HIGH (ref 70–99)

## 2014-07-19 MED ORDER — SULFAMETHOXAZOLE-TMP DS 800-160 MG PO TABS
1.0000 | ORAL_TABLET | Freq: Two times a day (BID) | ORAL | Status: DC
  Administered 2014-07-19 (×2): 1 via ORAL
  Filled 2014-07-19 (×3): qty 1

## 2014-07-19 MED ORDER — ZOLPIDEM TARTRATE 5 MG PO TABS
5.0000 mg | ORAL_TABLET | Freq: Once | ORAL | Status: DC
  Filled 2014-07-19: qty 1

## 2014-07-19 MED ORDER — DEXTROSE 5 % IV SOLN
1.0000 g | Freq: Every day | INTRAVENOUS | Status: DC
  Filled 2014-07-19: qty 10

## 2014-07-19 MED ORDER — SULFAMETHOXAZOLE-TMP DS 800-160 MG PO TABS: 1.0000 | ORAL_TABLET | Freq: Two times a day (BID) | ORAL | Status: DC

## 2014-07-19 MED ORDER — RISPERIDONE 0.5 MG PO TABS: 0.5000 mg | ORAL_TABLET | Freq: Two times a day (BID) | ORAL | Status: DC

## 2014-07-19 MED ORDER — ENSURE COMPLETE PO LIQD: 237.0000 mL | Freq: Two times a day (BID) | ORAL | Status: DC

## 2014-07-19 NOTE — Progress Notes (Signed)
Report called to Gordy Councilman, RN at [facility].

## 2014-07-19 NOTE — Progress Notes (Signed)
Clinical Social Work  Per MD, patient is medically stable to DC today. CSW contacted the following facilities re: placement:  [facility]- unsure of bed availibilty but encouraged CSW to send information. Referral faxed.  [facility]- Per [facility]) unsure of bed status until 1 pm  [facility]- 1 available bed. Referral faxed.  [facility]- no available beds  [facility]- available beds. Referral faxed.  CSW will continue to follow.  [place], [place] [phone]
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
## 2014-07-19 NOTE — Discharge Summary (Addendum)
Physician Discharge Summary  [name] URK:[id number] DOB: [birth year] DOA: 07/13/2014  PCP: No primary provider on file.  Admit date: 07/13/2014 Discharge date: 07/19/2014  Time spent: 35 minutes  Recommendations for Outpatient Follow-up:  1. discharge to [facility] 2. Please follow result of urine cx and sensitivity sent on 07/19/2014 and decide on antibiotic course. Patient started on Bactrim for UTI on 7/27.  Discharge Diagnoses:  Principal Problem:   Drug overdose  Active Problems:   Acute respiratory failure with hypoxia   Hypokalemia   Hypotension, unspecified   Suicidal ideations   UTI (urinary tract infection)   Discharge Condition: fair  Diet recommendation: regular  Filed Weights   07/13/14 1700 07/13/14 1755 07/14/14 0417  Weight: 53.6 kg (118 lb 2.7 oz) 53.6 kg (118 lb 2.7 oz) 56.3 kg (124 lb 1.9 oz)    History of present illness:  Please refer to admission H&P for details, but in brief, 58 year old female with past medical history of schizoaffective disorder, anxiety and depression, history of suicide attempt with Xanax overdose who presented to [facility] ED 07/13/2014 after an intention to harm herself with ingesting multiple pills of clonazepam and nefazodone on the morning of this admission. Her vitas were stable on admission. Her initial EKG showed sinus rhythm. Hospital course is complicated with ongoing agitation and combativeness.    Hospital Course:  Drug overdose / suicidal ideations /MDD  Patient monitored on suicidal precaution. Seen by psychiatry and recommended low dose Risperdal for agitation. Patient also receiving when necessary Ativan for anxiety. She hasn't required a few doses of IM Haldol while in the hospital and low dose of Geodon yesterday for severe agitation. -Patient required involuntary commitment and will be discharged to inpatient behavioral health as she is medically stable.  Active Problems:  Acute respiratory failure  with hypoxia  secondary to drug overdose. Currently stable  Hypokalemia  Unclear ideology. Repleted and stable  Hypotension, unspecified  On presentation. Likely secondary to overdose and has it he remained stable in the hospital.  Thrombocytopenia/ Anemia  No clear etiology. Platelets has now normalized. Patient does have mild anemia. Followup as outpatient  Metabolic acidosis  Likely from drug overdose. bicarbonates normal.  UTI Patient had symptoms of dysuria on 7/26 and UA was suggestive of UTI. Started on empiric Bactrim. Urine culture sent and should be followed up as outpatient.  Code Status: full code    Family Communication: None at bedside. plan of care discussed with the patient . Patient not interested and willing to inpatient psych facility and says none of the medications she takes at home helps with her symptoms.  Disposition Plan: Discharge to [facility]  Consultants:  Psychiatry   Procedures:  None   Antibiotics:  Oral Bactrim started on 7/27 a five-day course until final culture and sensitivity obtained     Discharge Exam: Filed Vitals:   07/19/14 0647  BP: 114/74  Pulse: 97  Temp: 99.5 F (37.5 C)  Resp: 20    General: Middle aged female in no acute distress HEENT: No pallor, moist oral mucosa Chest: Clear to auscultation bilaterally,  CVS: Normal S1 and S2 normal no murmur or gallop Abdomen: Soft, nontender, nondistended, bowel sounds present Extremities: Warm, no edema CNS: Alert and oriented   Discharge Instructions You were cared for by a hospitalist during your hospital stay. If you have any questions about your discharge medications or the care you received while you were in the hospital after  you are discharged, you can call the unit and asked to speak with the hospitalist on call if the hospitalist that took care of you is not available. Once you are discharged, your primary care physician will handle any  further medical issues. Please note that NO REFILLS for any discharge medications will be authorized once you are discharged, as it is imperative that you return to your primary care physician (or establish a relationship with a primary care physician if you do not have one) for your aftercare needs so that they can reassess your need for medications and monitor your lab values.     Medication List    STOP taking these medications       carbamazepine 200 MG tablet  Commonly known as:  TEGRETOL     clonazePAM 1 MG tablet  Commonly known as:  KLONOPIN     nefazodone 200 MG tablet  Commonly known as:  SERZONE      TAKE these medications       feeding supplement (ENSURE COMPLETE) Liqd  Take 237 mLs by mouth 2 (two) times daily between meals.     risperiDONE 0.5 MG tablet  Commonly known as:  RISPERDAL  Take 1 tablet (0.5 mg total) by mouth 2 (two) times daily.     sulfamethoxazole-trimethoprim 800-160 MG per tablet  Commonly known as:  BACTRIM DS  Take 1 tablet by mouth every 12 (twelve) hours. Started on 7/27       No Known Allergies     Follow-up Information   Please follow up. (discharge to [facility])        The results of significant diagnostics from this hospitalization (including imaging, microbiology, ancillary and laboratory) are listed below for reference.    Significant Diagnostic Studies: Dg Chest Port 1 View  07/13/2014   CLINICAL DATA:  Possible infiltrate  EXAM: PORTABLE CHEST - 1 VIEW  COMPARISON:  None.  FINDINGS: Cardiomediastinal silhouette is unremarkable. No acute infiltrate or pleural effusion. No pulmonary edema. Bony thorax is unremarkable.  IMPRESSION: No active disease.   Electronically Signed   By: Lahoma Crocker M.D.   On: 07/13/2014 17:53    Microbiology: Recent Results (from the past 240 hour(s))  MRSA PCR SCREENING     Status: None   Collection Time    07/13/14  5:21 PM      Result Value Ref Range Status   MRSA by PCR NEGATIVE  NEGATIVE Final    Comment:            The GeneXpert MRSA Assay (FDA     approved for NASAL specimens     only), is one component of a     comprehensive MRSA colonization     surveillance program. It is not     intended to diagnose MRSA     infection nor to guide or     monitor treatment for     MRSA infections.     Labs: Basic Metabolic Panel:  Recent Labs Lab 07/13/14 1259  07/13/14 1423 07/13/14 1853  07/14/14 0337 07/15/14 0630  07/16/14 0443 07/17/14 0732 07/18/14 0500 07/18/14 1700 07/19/14 0435  NA  --   < > 145 145  < >  --  144  --  142 136* 141  --  146  K  --   < > 2.1* 2.8*  < >  --  3.0*  < > 2.9* 4.7 3.1* 2.8* 3.9  CL  --   < > 109 114*  < >  --  116*  --  111 107 108  --  114*  CO2  --   < >  --  18*  < >  --  18*  --  19 14* 15*  --  21  GLUCOSE  --   < > 122* 147*  < >  --  101*  --  85 88 79  --  141*  BUN  --   < > 13 11  < >  --  5*  --  5* 6 7  --  10  CREATININE  --   < > 0.80 0.75  < >  --  0.69  --  0.74 0.47* 0.59  --  0.62  CALCIUM  --   < >  --  7.2*  < >  --  8.4  --  8.0* 7.9* 8.6  --  9.0  MG 2.2  --   --  1.6  --  1.7  --   --   --   --   --   --   --   PHOS  --   --   --  2.5  --   --   --   --   --   --   --   --   --   < > = values in this interval not displayed. Liver Function Tests:  Recent Labs Lab 07/13/14 1415 07/13/14 1853 07/14/14 0322  AST 12 21 16   ALT 6 10 8   ALKPHOS 87 67 66  BILITOT 0.3 <0.2* 0.2*  PROT 6.1 4.7* 4.7*  ALBUMIN 3.7 2.8* 2.8*   No results found for this basename: LIPASE, AMYLASE,  in the last 168 hours No results found for this basename: AMMONIA,  in the last 168 hours CBC:  Recent Labs Lab 07/13/14 1415  07/13/14 1853  07/15/14 0630 07/16/14 0443 07/17/14 0732 07/18/14 0500 07/19/14 0435  WBC 5.6  --  4.0  < > 6.0 9.1 9.7 7.6 7.7  NEUTROABS 2.6  --  2.4  --   --   --   --   --   --   HGB 11.7*  < > 9.7*  < > 10.2* 9.8* 11.6* 12.2 11.0*  HCT 33.7*  < > 28.2*  < > 29.0* 28.1* 33.1* 35.4* 31.1*  MCV 95.7  --   96.6  < > 97.0 97.9 96.2 98.1 95.1  PLT 175  --  124*  < > 129* 127* 161 171 187  < > = values in this interval not displayed. Cardiac Enzymes:  Recent Labs Lab 07/13/14 1415  TROPONINI <0.30   BNP: BNP (last 3 results) No results found for this basename: PROBNP,  in the last 8760 hours CBG:  Recent Labs Lab 07/15/14 0753 07/16/14 0726 07/17/14 0811 07/18/14 0741 07/19/14 0741  GLUCAP 71 74 72 103* 130*       Signed:  Purvis Sidle  [facility] 07/19/2014, 1:35 PM

## 2014-07-19 NOTE — Progress Notes (Signed)
Clinical Social Work  CSW spoke with [facility] Otila Kluver) at [facility] who anticipates bed will be available today. AC reports she will call CSW back once bed is available or encouraged CSW to call her back at 3:30pm if CSW has not heard from [facility]. CSW will continue to follow.  [place], [place] [phone]

## 2014-07-19 NOTE — Progress Notes (Signed)
This shift pt c/o pain and burning with urination. Attending notified and new orders given.UA ordered and sent to lab. Will continue to monitor and intervene appropriately.

## 2014-07-19 NOTE — Progress Notes (Addendum)
Clinical Social Work  Patient accepted to [facility] by Dr. Alcide Clever. RN to call report to [phone]. CSW informed patient about DC plans and that sheriff will transport since she is under IVC. Patient upset about DC plans and reports she wanted to return home. Patient agreeable for ex-husband to be updated so CSW called ex-husband and gave him information on DC plans. CSW called and left a message with sheriff re: transportation needs.  Sindy Messing, [place] [phone]  [id number] CSW spoke with sheriff who confirmed that they will provide transportation for patient to [facility]. [name] will call nursing unit when they are about 30 minutes from coming to the hospital. CSW is signing off but available if needed.

## 2014-07-19 NOTE — Progress Notes (Signed)
Pt is in bed ripping up money (paper bills of unknown amount) she states she is agitated and is ready to go. Pt informed that sheriff would arrive about 8pm to transport her. She is currently refusing to eat.

## 2014-07-19 NOTE — Progress Notes (Signed)
ANTIBIOTIC CONSULT NOTE - INITIAL  Pharmacy Consult for Bactrim Indication: UTI  No Known Allergies  Patient Measurements: Height: 5\' 1"  (154.9 cm) Weight: 124 lb 1.9 oz (56.3 kg) IBW/kg (Calculated) : 47.8 Adjusted Body Weight:   Vital Signs: Temp: 98.5 F (36.9 C) (07/26 2229) Temp src: Oral (07/26 2229) BP: 103/77 mmHg (07/26 2229) Pulse Rate: 91 (07/26 2229) Intake/Output from previous day: 07/26 0701 - 07/27 0700 In: 720 [P.O.:720] Out: -  Intake/Output from this shift:    Labs:  Recent Labs  07/16/14 0443 07/17/14 0732 07/18/14 0500  WBC 9.1 9.7 7.6  HGB 9.8* 11.6* 12.2  PLT 127* 161 171  CREATININE 0.74 0.47* 0.59   Estimated Creatinine Clearance: 57.1 ml/min (by C-G formula based on Cr of 0.59). No results found for this basename: VANCOTROUGH, Corlis Leak, VANCORANDOM, [place], GENTPEAK, GENTRANDOM, TOBRATROUGH, TOBRAPEAK, TOBRARND, AMIKACINPEAK, AMIKACINTROU, AMIKACIN,  in the last 72 hours   Microbiology: Recent Results (from the past 720 hour(s))  MRSA PCR SCREENING     Status: None   Collection Time    07/13/14  5:21 PM      Result Value Ref Range Status   MRSA by PCR NEGATIVE  NEGATIVE Final   Comment:            The GeneXpert MRSA Assay (FDA     approved for NASAL specimens     only), is one component of a     comprehensive MRSA colonization     surveillance program. It is not     intended to diagnose MRSA     infection nor to guide or     monitor treatment for     MRSA infections.    Medical History: Past Medical History  Diagnosis Date  . History of suicide attempt 2012--  XANAX OVERDOSE  . Schizoaffective disorder   . Migraine   . Depression   . Carpal tunnel syndrome of left wrist   . Cubital tunnel syndrome on left   . Anxiety     Medications:  Anti-infectives   Start     Dose/Rate Route Frequency Ordered Stop   07/19/14 0115  sulfamethoxazole-trimethoprim (BACTRIM DS) 800-160 MG per tablet 1 tablet     1 tablet Oral  Every 12 hours 07/19/14 0101     07/19/14 0045  cefTRIAXone (ROCEPHIN) 1 g in dextrose 5 % 50 mL IVPB  Status:  Discontinued     1 g 100 mL/hr over 30 Minutes Intravenous Daily at bedtime 07/19/14 0018 07/19/14 0047     Assessment: Patient with UTI.  Patient ordered rocephin but changed to bactrim to reduce risk of fungal infection, per RN.  Goal of Therapy:  Bactrim dosed based on patient weight and renal function   Plan:  Follow up culture results Bactrim DS 1 tab q12hr x 7days   Tyler Deis, Fernie Grimm [place] 07/19/2014,1:31 AM

## 2014-07-20 LAB — URINE CULTURE
CULTURE: NO GROWTH
Colony Count: NO GROWTH

## 2014-09-17 ENCOUNTER — Other Ambulatory Visit (HOSPITAL_COMMUNITY)
Admission: RE | Admit: 2014-09-17 | Discharge: 2014-09-17 | Disposition: A | Discharge status: Home / Self Care | Payer: Medicare PPO | Source: Ambulatory Visit | Attending: Obstetrics and Gynecology | Admitting: Obstetrics and Gynecology

## 2014-09-17 ENCOUNTER — Other Ambulatory Visit: Payer: Self-pay | Admitting: Obstetrics and Gynecology

## 2014-09-17 DIAGNOSIS — Z124 Encounter for screening for malignant neoplasm of cervix: Secondary | ICD-10-CM | POA: Diagnosis present

## 2014-09-20 LAB — CYTOLOGY - PAP

## 2014-09-22 ENCOUNTER — Other Ambulatory Visit (HOSPITAL_COMMUNITY): Payer: Self-pay | Admitting: Family Medicine

## 2014-09-22 DIAGNOSIS — Z1231 Encounter for screening mammogram for malignant neoplasm of breast: Secondary | ICD-10-CM

## 2014-10-29 ENCOUNTER — Ambulatory Visit (HOSPITAL_COMMUNITY): Payer: Medicare PPO

## 2014-11-01 ENCOUNTER — Ambulatory Visit (HOSPITAL_COMMUNITY)
Admission: RE | Admit: 2014-11-01 | Discharge: 2014-11-01 | Disposition: A | Discharge status: Home / Self Care | Payer: Medicare PPO | Source: Ambulatory Visit | Attending: Family Medicine | Admitting: Family Medicine

## 2014-11-01 DIAGNOSIS — Z1231 Encounter for screening mammogram for malignant neoplasm of breast: Secondary | ICD-10-CM

## 2015-01-17 DIAGNOSIS — H5210 Myopia, unspecified eye: Secondary | ICD-10-CM | POA: Diagnosis not present

## 2015-01-17 DIAGNOSIS — H524 Presbyopia: Secondary | ICD-10-CM | POA: Diagnosis not present

## 2015-01-18 DIAGNOSIS — Z0100 Encounter for examination of eyes and vision without abnormal findings: Secondary | ICD-10-CM | POA: Diagnosis not present

## 2015-01-18 DIAGNOSIS — L259 Unspecified contact dermatitis, unspecified cause: Secondary | ICD-10-CM | POA: Diagnosis not present

## 2015-02-01 DIAGNOSIS — K219 Gastro-esophageal reflux disease without esophagitis: Secondary | ICD-10-CM | POA: Diagnosis not present

## 2015-02-01 DIAGNOSIS — Z1211 Encounter for screening for malignant neoplasm of colon: Secondary | ICD-10-CM | POA: Diagnosis not present

## 2015-02-08 DIAGNOSIS — F331 Major depressive disorder, recurrent, moderate: Secondary | ICD-10-CM | POA: Diagnosis not present

## 2015-02-08 DIAGNOSIS — F411 Generalized anxiety disorder: Secondary | ICD-10-CM | POA: Diagnosis not present

## 2015-02-10 DIAGNOSIS — L299 Pruritus, unspecified: Secondary | ICD-10-CM | POA: Diagnosis not present

## 2015-02-10 DIAGNOSIS — H109 Unspecified conjunctivitis: Secondary | ICD-10-CM | POA: Diagnosis not present

## 2015-02-15 DIAGNOSIS — F411 Generalized anxiety disorder: Secondary | ICD-10-CM | POA: Diagnosis not present

## 2015-02-15 DIAGNOSIS — F331 Major depressive disorder, recurrent, moderate: Secondary | ICD-10-CM | POA: Diagnosis not present

## 2015-02-22 DIAGNOSIS — F331 Major depressive disorder, recurrent, moderate: Secondary | ICD-10-CM | POA: Diagnosis not present

## 2015-02-22 DIAGNOSIS — F411 Generalized anxiety disorder: Secondary | ICD-10-CM | POA: Diagnosis not present

## 2015-03-01 DIAGNOSIS — F411 Generalized anxiety disorder: Secondary | ICD-10-CM | POA: Diagnosis not present

## 2015-03-01 DIAGNOSIS — F331 Major depressive disorder, recurrent, moderate: Secondary | ICD-10-CM | POA: Diagnosis not present

## 2015-03-02 DIAGNOSIS — Z1211 Encounter for screening for malignant neoplasm of colon: Secondary | ICD-10-CM | POA: Diagnosis not present

## 2015-03-02 DIAGNOSIS — K641 Second degree hemorrhoids: Secondary | ICD-10-CM | POA: Diagnosis not present

## 2015-03-04 DIAGNOSIS — G43909 Migraine, unspecified, not intractable, without status migrainosus: Secondary | ICD-10-CM | POA: Diagnosis not present

## 2015-03-04 DIAGNOSIS — F259 Schizoaffective disorder, unspecified: Secondary | ICD-10-CM | POA: Diagnosis not present

## 2015-03-04 DIAGNOSIS — F3110 Bipolar disorder, current episode manic without psychotic features, unspecified: Secondary | ICD-10-CM | POA: Diagnosis not present

## 2015-03-07 DIAGNOSIS — M791 Myalgia: Secondary | ICD-10-CM | POA: Diagnosis not present

## 2015-03-07 DIAGNOSIS — R531 Weakness: Secondary | ICD-10-CM | POA: Diagnosis not present

## 2015-03-14 DIAGNOSIS — D691 Qualitative platelet defects: Secondary | ICD-10-CM | POA: Diagnosis not present

## 2015-03-14 DIAGNOSIS — R748 Abnormal levels of other serum enzymes: Secondary | ICD-10-CM | POA: Diagnosis not present

## 2015-03-14 DIAGNOSIS — R7309 Other abnormal glucose: Secondary | ICD-10-CM | POA: Diagnosis not present

## 2015-03-16 DIAGNOSIS — F331 Major depressive disorder, recurrent, moderate: Secondary | ICD-10-CM | POA: Diagnosis not present

## 2015-03-16 DIAGNOSIS — F411 Generalized anxiety disorder: Secondary | ICD-10-CM | POA: Diagnosis not present

## 2015-03-29 DIAGNOSIS — F331 Major depressive disorder, recurrent, moderate: Secondary | ICD-10-CM | POA: Diagnosis not present

## 2015-03-29 DIAGNOSIS — F411 Generalized anxiety disorder: Secondary | ICD-10-CM | POA: Diagnosis not present

## 2015-04-07 DIAGNOSIS — S83242A Other tear of medial meniscus, current injury, left knee, initial encounter: Secondary | ICD-10-CM | POA: Diagnosis not present

## 2015-04-07 DIAGNOSIS — M1712 Unilateral primary osteoarthritis, left knee: Secondary | ICD-10-CM | POA: Diagnosis not present

## 2015-04-07 DIAGNOSIS — M25562 Pain in left knee: Secondary | ICD-10-CM | POA: Diagnosis not present

## 2015-04-11 DIAGNOSIS — F331 Major depressive disorder, recurrent, moderate: Secondary | ICD-10-CM | POA: Diagnosis not present

## 2015-04-11 DIAGNOSIS — F411 Generalized anxiety disorder: Secondary | ICD-10-CM | POA: Diagnosis not present

## 2015-04-13 DIAGNOSIS — M25462 Effusion, left knee: Secondary | ICD-10-CM | POA: Diagnosis not present

## 2015-04-13 DIAGNOSIS — S83242A Other tear of medial meniscus, current injury, left knee, initial encounter: Secondary | ICD-10-CM | POA: Diagnosis not present

## 2015-04-18 DIAGNOSIS — F411 Generalized anxiety disorder: Secondary | ICD-10-CM | POA: Diagnosis not present

## 2015-04-18 DIAGNOSIS — F331 Major depressive disorder, recurrent, moderate: Secondary | ICD-10-CM | POA: Diagnosis not present

## 2015-04-19 DIAGNOSIS — S83242D Other tear of medial meniscus, current injury, left knee, subsequent encounter: Secondary | ICD-10-CM | POA: Diagnosis not present

## 2015-04-26 DIAGNOSIS — F331 Major depressive disorder, recurrent, moderate: Secondary | ICD-10-CM | POA: Diagnosis not present

## 2015-05-02 ENCOUNTER — Ambulatory Visit
Admission: RE | Admit: 2015-05-02 | Discharge: 2015-05-02 | Disposition: A | Discharge status: Home / Self Care | Payer: Commercial Managed Care - HMO | Source: Ambulatory Visit | Attending: Family Medicine | Admitting: Family Medicine

## 2015-05-02 ENCOUNTER — Other Ambulatory Visit: Payer: Self-pay | Admitting: Family Medicine

## 2015-05-02 DIAGNOSIS — Z01818 Encounter for other preprocedural examination: Secondary | ICD-10-CM | POA: Diagnosis not present

## 2015-05-02 DIAGNOSIS — W19XXXA Unspecified fall, initial encounter: Secondary | ICD-10-CM | POA: Diagnosis not present

## 2015-05-02 DIAGNOSIS — F259 Schizoaffective disorder, unspecified: Secondary | ICD-10-CM | POA: Diagnosis not present

## 2015-05-02 DIAGNOSIS — M25562 Pain in left knee: Secondary | ICD-10-CM | POA: Diagnosis not present

## 2015-05-02 DIAGNOSIS — S8992XA Unspecified injury of left lower leg, initial encounter: Secondary | ICD-10-CM | POA: Diagnosis not present

## 2015-05-02 DIAGNOSIS — S8002XA Contusion of left knee, initial encounter: Secondary | ICD-10-CM

## 2015-05-02 DIAGNOSIS — Z01810 Encounter for preprocedural cardiovascular examination: Secondary | ICD-10-CM | POA: Diagnosis not present

## 2015-05-02 DIAGNOSIS — Z87828 Personal history of other (healed) physical injury and trauma: Secondary | ICD-10-CM | POA: Diagnosis not present

## 2015-05-03 DIAGNOSIS — F331 Major depressive disorder, recurrent, moderate: Secondary | ICD-10-CM | POA: Diagnosis not present

## 2015-05-11 ENCOUNTER — Other Ambulatory Visit: Payer: Self-pay | Admitting: Orthopedic Surgery

## 2015-05-11 DIAGNOSIS — Y929 Unspecified place or not applicable: Secondary | ICD-10-CM | POA: Diagnosis not present

## 2015-05-11 DIAGNOSIS — M109 Gout, unspecified: Secondary | ICD-10-CM | POA: Diagnosis not present

## 2015-05-11 DIAGNOSIS — X58XXXA Exposure to other specified factors, initial encounter: Secondary | ICD-10-CM | POA: Diagnosis not present

## 2015-05-11 DIAGNOSIS — M94262 Chondromalacia, left knee: Secondary | ICD-10-CM | POA: Diagnosis not present

## 2015-05-11 DIAGNOSIS — S83242A Other tear of medial meniscus, current injury, left knee, initial encounter: Secondary | ICD-10-CM | POA: Diagnosis not present

## 2015-05-19 DIAGNOSIS — S83242D Other tear of medial meniscus, current injury, left knee, subsequent encounter: Secondary | ICD-10-CM | POA: Diagnosis not present

## 2015-05-27 DIAGNOSIS — F331 Major depressive disorder, recurrent, moderate: Secondary | ICD-10-CM | POA: Diagnosis not present

## 2015-05-27 DIAGNOSIS — F411 Generalized anxiety disorder: Secondary | ICD-10-CM | POA: Diagnosis not present

## 2015-06-03 DIAGNOSIS — F331 Major depressive disorder, recurrent, moderate: Secondary | ICD-10-CM | POA: Diagnosis not present

## 2015-06-03 DIAGNOSIS — F411 Generalized anxiety disorder: Secondary | ICD-10-CM | POA: Diagnosis not present

## 2015-06-17 DIAGNOSIS — F331 Major depressive disorder, recurrent, moderate: Secondary | ICD-10-CM | POA: Diagnosis not present

## 2015-06-17 DIAGNOSIS — F411 Generalized anxiety disorder: Secondary | ICD-10-CM | POA: Diagnosis not present

## 2015-06-24 DIAGNOSIS — F331 Major depressive disorder, recurrent, moderate: Secondary | ICD-10-CM | POA: Diagnosis not present

## 2015-06-28 DIAGNOSIS — M1712 Unilateral primary osteoarthritis, left knee: Secondary | ICD-10-CM | POA: Diagnosis not present

## 2015-06-29 DIAGNOSIS — B009 Herpesviral infection, unspecified: Secondary | ICD-10-CM | POA: Diagnosis not present

## 2015-07-20 DIAGNOSIS — M25562 Pain in left knee: Secondary | ICD-10-CM | POA: Diagnosis not present

## 2015-07-20 DIAGNOSIS — M25511 Pain in right shoulder: Secondary | ICD-10-CM | POA: Diagnosis not present

## 2015-07-20 DIAGNOSIS — M160 Bilateral primary osteoarthritis of hip: Secondary | ICD-10-CM | POA: Diagnosis not present

## 2015-07-29 DIAGNOSIS — F603 Borderline personality disorder: Secondary | ICD-10-CM | POA: Diagnosis not present

## 2015-09-05 DIAGNOSIS — G43909 Migraine, unspecified, not intractable, without status migrainosus: Secondary | ICD-10-CM | POA: Diagnosis not present

## 2015-09-05 DIAGNOSIS — F339 Major depressive disorder, recurrent, unspecified: Secondary | ICD-10-CM | POA: Diagnosis not present

## 2015-09-05 DIAGNOSIS — Z23 Encounter for immunization: Secondary | ICD-10-CM | POA: Diagnosis not present

## 2015-09-05 DIAGNOSIS — F259 Schizoaffective disorder, unspecified: Secondary | ICD-10-CM | POA: Diagnosis not present

## 2015-09-20 ENCOUNTER — Other Ambulatory Visit: Payer: Self-pay | Admitting: Obstetrics and Gynecology

## 2015-09-20 ENCOUNTER — Other Ambulatory Visit (HOSPITAL_COMMUNITY)
Admission: RE | Admit: 2015-09-20 | Discharge: 2015-09-20 | Disposition: A | Discharge status: Home / Self Care | Payer: Commercial Managed Care - HMO | Source: Ambulatory Visit | Attending: Obstetrics and Gynecology | Admitting: Obstetrics and Gynecology

## 2015-09-20 DIAGNOSIS — B009 Herpesviral infection, unspecified: Secondary | ICD-10-CM | POA: Diagnosis not present

## 2015-09-20 DIAGNOSIS — Z124 Encounter for screening for malignant neoplasm of cervix: Secondary | ICD-10-CM | POA: Diagnosis not present

## 2015-09-20 DIAGNOSIS — Z9189 Other specified personal risk factors, not elsewhere classified: Secondary | ICD-10-CM | POA: Diagnosis not present

## 2015-09-20 DIAGNOSIS — Z01419 Encounter for gynecological examination (general) (routine) without abnormal findings: Secondary | ICD-10-CM | POA: Diagnosis not present

## 2015-09-22 ENCOUNTER — Other Ambulatory Visit: Payer: Self-pay

## 2015-09-22 DIAGNOSIS — Z1231 Encounter for screening mammogram for malignant neoplasm of breast: Secondary | ICD-10-CM

## 2015-09-22 LAB — CYTOLOGY - PAP

## 2015-10-20 DIAGNOSIS — R5383 Other fatigue: Secondary | ICD-10-CM | POA: Diagnosis not present

## 2015-10-20 DIAGNOSIS — F603 Borderline personality disorder: Secondary | ICD-10-CM | POA: Diagnosis not present

## 2015-10-20 DIAGNOSIS — E785 Hyperlipidemia, unspecified: Secondary | ICD-10-CM | POA: Diagnosis not present

## 2015-11-04 ENCOUNTER — Ambulatory Visit
Admission: RE | Admit: 2015-11-04 | Discharge: 2015-11-04 | Disposition: A | Discharge status: Home / Self Care | Payer: Commercial Managed Care - HMO | Source: Ambulatory Visit

## 2015-11-04 DIAGNOSIS — Z1231 Encounter for screening mammogram for malignant neoplasm of breast: Secondary | ICD-10-CM | POA: Diagnosis not present

## 2016-03-05 DIAGNOSIS — G43909 Migraine, unspecified, not intractable, without status migrainosus: Secondary | ICD-10-CM | POA: Diagnosis not present

## 2016-03-05 DIAGNOSIS — F603 Borderline personality disorder: Secondary | ICD-10-CM | POA: Diagnosis not present

## 2016-04-12 DIAGNOSIS — S20212A Contusion of left front wall of thorax, initial encounter: Secondary | ICD-10-CM | POA: Diagnosis not present

## 2016-04-12 DIAGNOSIS — S7002XA Contusion of left hip, initial encounter: Secondary | ICD-10-CM | POA: Diagnosis not present

## 2016-04-12 DIAGNOSIS — K219 Gastro-esophageal reflux disease without esophagitis: Secondary | ICD-10-CM | POA: Diagnosis not present

## 2016-05-18 ENCOUNTER — Other Ambulatory Visit: Payer: Self-pay

## 2016-05-18 DIAGNOSIS — Z1231 Encounter for screening mammogram for malignant neoplasm of breast: Secondary | ICD-10-CM

## 2016-05-24 DIAGNOSIS — R42 Dizziness and giddiness: Secondary | ICD-10-CM | POA: Diagnosis not present

## 2016-05-24 DIAGNOSIS — N289 Disorder of kidney and ureter, unspecified: Secondary | ICD-10-CM | POA: Diagnosis not present

## 2016-05-24 DIAGNOSIS — R109 Unspecified abdominal pain: Secondary | ICD-10-CM | POA: Diagnosis not present

## 2016-05-24 DIAGNOSIS — J329 Chronic sinusitis, unspecified: Secondary | ICD-10-CM | POA: Diagnosis not present

## 2016-05-24 DIAGNOSIS — R1013 Epigastric pain: Secondary | ICD-10-CM | POA: Diagnosis not present

## 2016-06-06 DIAGNOSIS — H5210 Myopia, unspecified eye: Secondary | ICD-10-CM | POA: Diagnosis not present

## 2016-06-06 DIAGNOSIS — H524 Presbyopia: Secondary | ICD-10-CM | POA: Diagnosis not present

## 2016-06-06 DIAGNOSIS — Z0100 Encounter for examination of eyes and vision without abnormal findings: Secondary | ICD-10-CM | POA: Diagnosis not present

## 2016-06-21 DIAGNOSIS — L989 Disorder of the skin and subcutaneous tissue, unspecified: Secondary | ICD-10-CM | POA: Diagnosis not present

## 2016-06-21 DIAGNOSIS — R42 Dizziness and giddiness: Secondary | ICD-10-CM | POA: Diagnosis not present

## 2016-08-29 DIAGNOSIS — G43909 Migraine, unspecified, not intractable, without status migrainosus: Secondary | ICD-10-CM | POA: Diagnosis not present

## 2016-08-29 DIAGNOSIS — M5136 Other intervertebral disc degeneration, lumbar region: Secondary | ICD-10-CM | POA: Diagnosis not present

## 2016-08-29 DIAGNOSIS — F603 Borderline personality disorder: Secondary | ICD-10-CM | POA: Diagnosis not present

## 2016-08-29 DIAGNOSIS — D696 Thrombocytopenia, unspecified: Secondary | ICD-10-CM | POA: Diagnosis not present

## 2016-08-29 DIAGNOSIS — F339 Major depressive disorder, recurrent, unspecified: Secondary | ICD-10-CM | POA: Diagnosis not present

## 2016-08-29 DIAGNOSIS — R8290 Unspecified abnormal findings in urine: Secondary | ICD-10-CM | POA: Diagnosis not present

## 2016-08-29 DIAGNOSIS — N289 Disorder of kidney and ureter, unspecified: Secondary | ICD-10-CM | POA: Diagnosis not present

## 2016-08-29 DIAGNOSIS — Z23 Encounter for immunization: Secondary | ICD-10-CM | POA: Diagnosis not present

## 2016-09-21 DIAGNOSIS — M160 Bilateral primary osteoarthritis of hip: Secondary | ICD-10-CM | POA: Diagnosis not present

## 2016-09-24 ENCOUNTER — Other Ambulatory Visit: Payer: Self-pay | Admitting: Obstetrics and Gynecology

## 2016-09-24 ENCOUNTER — Other Ambulatory Visit (HOSPITAL_COMMUNITY)
Admission: RE | Admit: 2016-09-24 | Discharge: 2016-09-24 | Disposition: A | Discharge status: Home / Self Care | Payer: Commercial Managed Care - HMO | Source: Ambulatory Visit | Attending: Obstetrics and Gynecology | Admitting: Obstetrics and Gynecology

## 2016-09-24 DIAGNOSIS — B009 Herpesviral infection, unspecified: Secondary | ICD-10-CM | POA: Diagnosis not present

## 2016-09-24 DIAGNOSIS — Z1151 Encounter for screening for human papillomavirus (HPV): Secondary | ICD-10-CM | POA: Diagnosis not present

## 2016-09-24 DIAGNOSIS — Z01419 Encounter for gynecological examination (general) (routine) without abnormal findings: Secondary | ICD-10-CM | POA: Diagnosis not present

## 2016-09-24 DIAGNOSIS — Z9189 Other specified personal risk factors, not elsewhere classified: Secondary | ICD-10-CM | POA: Diagnosis not present

## 2016-09-25 LAB — CYTOLOGY - PAP

## 2016-10-01 DIAGNOSIS — F339 Major depressive disorder, recurrent, unspecified: Secondary | ICD-10-CM | POA: Diagnosis not present

## 2016-10-01 DIAGNOSIS — D696 Thrombocytopenia, unspecified: Secondary | ICD-10-CM | POA: Diagnosis not present

## 2016-10-01 DIAGNOSIS — Z01810 Encounter for preprocedural cardiovascular examination: Secondary | ICD-10-CM | POA: Diagnosis not present

## 2016-10-01 DIAGNOSIS — M9905 Segmental and somatic dysfunction of pelvic region: Secondary | ICD-10-CM | POA: Diagnosis not present

## 2016-10-01 DIAGNOSIS — M1611 Unilateral primary osteoarthritis, right hip: Secondary | ICD-10-CM | POA: Diagnosis not present

## 2016-10-01 DIAGNOSIS — Z01818 Encounter for other preprocedural examination: Secondary | ICD-10-CM | POA: Diagnosis not present

## 2016-10-01 DIAGNOSIS — N289 Disorder of kidney and ureter, unspecified: Secondary | ICD-10-CM | POA: Diagnosis not present

## 2016-10-08 DIAGNOSIS — F603 Borderline personality disorder: Secondary | ICD-10-CM | POA: Diagnosis present

## 2016-10-08 DIAGNOSIS — M549 Dorsalgia, unspecified: Secondary | ICD-10-CM

## 2016-10-08 DIAGNOSIS — G8929 Other chronic pain: Secondary | ICD-10-CM | POA: Diagnosis present

## 2016-10-08 DIAGNOSIS — M1611 Unilateral primary osteoarthritis, right hip: Secondary | ICD-10-CM | POA: Diagnosis present

## 2016-10-08 DIAGNOSIS — M160 Bilateral primary osteoarthritis of hip: Secondary | ICD-10-CM | POA: Diagnosis not present

## 2016-10-08 DIAGNOSIS — G43909 Migraine, unspecified, not intractable, without status migrainosus: Secondary | ICD-10-CM | POA: Diagnosis present

## 2016-10-08 NOTE — H&P (Signed)
PREOPERATIVE H&P Patient ID: [name] MRN: [id number] DOB/AGE: Apr 07, 1956 60 y.o.  Chief Complaint: OA RIGHT HIP  Planned Procedure Date: 10/23/16 Medical and Cardiac Clearance by Dr. Dema Severin    HPI: [name] is a 60 y.o. female who presents for evaluation of OA RIGHT HIP. The patient has a history of pain and functional disability in the right hip due to arthritis and has failed non-surgical conservative treatments for greater than 12 weeks to include NSAID's and/or analgesics, corticosteriod injections and activity modification.  Onset of symptoms was gradual, starting >10 years ago with gradually worsening course since that time.  Patient currently rates pain at 8 out of 10 with activity. Patient has night pain, worsening of pain with activity and weight bearing and pain that interferes with activities of daily living.  Patient has evidence of subchondral cysts, subchondral sclerosis and joint space narrowing by imaging studies. There is no active infection.    Past Medical History:  Diagnosis Date  . Anxiety   . Carpal tunnel syndrome of left wrist   . Cubital tunnel syndrome on left   . Depression   . History of suicide attempt 2012--  XANAX OVERDOSE  . Migraine   . Schizoaffective disorder    Past Surgical History:  Procedure Laterality Date  . CARPAL TUNNEL RELEASE Left 05/13/2013   Procedure: CARPAL TUNNEL RELEASE ENDOSCOPIC;  Surgeon: Jolyn Nap, MD;  Location: [facility];  Service: Orthopedics;  Laterality: Left;  Incision at 0955.   Marland Kitchen CERVICAL BIOPSY  W/ LOOP ELECTRODE EXCISION  12-01-2008   CIN II  . FEMORAL HERNIA REPAIR Left 10-31-2009   AND INGUINAL LYMPH NODE BX  . LUMBAR DISC SURGERY  X3   LAST ONE 1989   FUSION  . NERVE REPAIR Left 05/13/2013   Procedure: ULNAR NEUROPLASTY AT ELBOW;  Surgeon: Jolyn Nap, MD;  Location: [facility];  Service: Orthopedics;  Laterality: Left;  . REPAIR PARTIAL FINGER AMPUTATION   1989   No Known Allergies Prior to Admission medications   Medication Sig Start Date End Date Taking? Authorizing Provider  feeding supplement, ENSURE COMPLETE, (ENSURE COMPLETE) LIQD Take 237 mLs by mouth 2 (two) times daily between meals. 07/19/14   Nishant Dhungel, MD  risperiDONE (RISPERDAL) 0.5 MG tablet Take 1 tablet (0.5 mg total) by mouth 2 (two) times daily. 07/19/14   Nishant Dhungel, MD  sulfamethoxazole-trimethoprim (BACTRIM DS) 800-160 MG per tablet Take 1 tablet by mouth every 12 (twelve) hours. 07/19/14   Louellen Molder, MD   Family History: Mother with HTN, CVA, Cancer.  Father with DM, Cancer  ROS: Currently denies lightheadedness, dizziness, Fever, chills, CP, SOB.   No personal history of DVT, PE, MI, or CVA. No loose teeth or dentures She has chronic constipation, migraines. All other systems have been reviewed and were otherwise currently negative with the exception of those mentioned in the HPI and as above.  Objective: Vitals: Ht: 5' Wt: 103.5 Temp: 98.1 BP: 92/59 Pulse: 66 O2 100% on room air. Physical Exam: General: Alert, NAD. / Trendelenberg Gait HEENT: EOMI, Good Neck Extension  Pulm: No increased work of breathing.  Clear B/L A/P w/o crackle or wheeze.  CV: RRR, No m/g/r appreciated  GI: soft, NT, ND Neuro: Neuro grossly intact b/l upper/lower ext.  Sensation intact distally Skin: No lesions in the area of chief complaint MSK/Surgical Site: Right Hip Non tender over greater trochanter.  Pain with passive ROM.  Positive Stinchfield.  5/5 strength.  NVI.  Sensation intact distally.   Imaging Review Plain radiographs demonstrate severe degenerative joint disease of the bilateral hip osteoarthritis.   Assessment: OA RIGHT HIP Principal Problem:   Primary osteoarthritis of right hip Active Problems:   Borderline personality disorder   Migraines   Chronic back pain   Osteoarthritis of hips, bilateral   Plan: Plan for Procedure(s): TOTAL HIP  ARTHROPLASTY ANTERIOR APPROACH  The patient history, physical exam, clinical judgement of the provider and imaging are consistent with end stage degenerative joint disease and total joint arthroplasty is deemed medically necessary. The treatment options including medical management, injection therapy, and arthroplasty were discussed at length. The risks and benefits of Procedure(s): TOTAL HIP ARTHROPLASTY ANTERIOR APPROACH were presented and reviewed.  The risks of nonoperative treatment, versus surgical intervention including but not limited to continued pain, aseptic loosening, stiffness, dislocation/subluxation, infection, bleeding, nerve injury, blood clots, cardiopulmonary complications, morbidity, mortality, among others were discussed. The patient verbalizes understanding and wishes to proceed with the plan.  Patient is being admitted for inpatient treatment for surgery, pain control, PT, OT, prophylactic antibiotics, VTE prophylaxis, progressive ambulation, ADL's and discharge planning.   Dental prophylaxis discussed and recommended for 2 years postoperatively.  The patient does meet the criteria for TXA which will be used perioperatively via IV.   Xarelto will be used postoperatively for DVT prophylaxis in addition to SCDs, and early ambulation.  Hx of severe GI distress w/ ASA. The patient is planning to be discharged home with home health services in care of Johna [name] her friend. 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 Tramadol ONLY for PO pain control - patient does not want stronger medicine. Need Terazole brand name vaginal cream dt yeast infxn w/ any abx (including pre-op).  Prudencio Burly III, PA-C 10/08/2016 5:25 PM

## 2016-10-12 ENCOUNTER — Encounter (HOSPITAL_COMMUNITY): Payer: Self-pay

## 2016-10-12 ENCOUNTER — Encounter (HOSPITAL_COMMUNITY)
Admission: RE | Admit: 2016-10-12 | Discharge: 2016-10-12 | Disposition: A | Discharge status: Home / Self Care | Payer: Commercial Managed Care - HMO | Source: Ambulatory Visit | Attending: Orthopedic Surgery | Admitting: Orthopedic Surgery

## 2016-10-12 DIAGNOSIS — Z01812 Encounter for preprocedural laboratory examination: Secondary | ICD-10-CM | POA: Insufficient documentation

## 2016-10-12 HISTORY — DX: Personality disorder, unspecified: F60.9

## 2016-10-12 HISTORY — DX: Other complications of anesthesia, initial encounter: T88.59XA

## 2016-10-12 HISTORY — DX: Dyspnea, unspecified: R06.00

## 2016-10-12 HISTORY — DX: Unspecified osteoarthritis, unspecified site: M19.90

## 2016-10-12 HISTORY — DX: Personal history of urinary calculi: Z87.442

## 2016-10-12 HISTORY — DX: Gastro-esophageal reflux disease without esophagitis: K21.9

## 2016-10-12 HISTORY — DX: Adverse effect of unspecified anesthetic, initial encounter: T41.45XA

## 2016-10-12 LAB — ABO/RH: ABO/RH(D): A POS

## 2016-10-12 LAB — URINALYSIS, ROUTINE W REFLEX MICROSCOPIC
Bilirubin Urine: NEGATIVE
GLUCOSE, UA: NEGATIVE mg/dL
Hgb urine dipstick: NEGATIVE
KETONES UR: NEGATIVE mg/dL
LEUKOCYTES UA: NEGATIVE
NITRITE: NEGATIVE
PH: 6 (ref 5.0–8.0)
Protein, ur: NEGATIVE mg/dL
SPECIFIC GRAVITY, URINE: 1.014 (ref 1.005–1.030)

## 2016-10-12 LAB — TYPE AND SCREEN
ABO/RH(D): A POS
Antibody Screen: NEGATIVE

## 2016-10-12 LAB — CBC
HCT: 40.4 % (ref 36.0–46.0)
Hemoglobin: 13 g/dL (ref 12.0–15.0)
MCH: 32.3 pg (ref 26.0–34.0)
MCHC: 32.2 g/dL (ref 30.0–36.0)
MCV: 100.2 fL — ABNORMAL HIGH (ref 78.0–100.0)
PLATELETS: 151 10*3/uL (ref 150–400)
RBC: 4.03 MIL/uL (ref 3.87–5.11)
RDW: 12.6 % (ref 11.5–15.5)
WBC: 5 10*3/uL (ref 4.0–10.5)

## 2016-10-12 LAB — BASIC METABOLIC PANEL
ANION GAP: 5 (ref 5–15)
BUN: 14 mg/dL (ref 6–20)
CALCIUM: 9.5 mg/dL (ref 8.9–10.3)
CO2: 24 mmol/L (ref 22–32)
CREATININE: 1.1 mg/dL — AB (ref 0.44–1.00)
Chloride: 112 mmol/L — ABNORMAL HIGH (ref 101–111)
GFR calc Af Amer: >60 mL/min (ref >60)
GFR, EST NON AFRICAN AMERICAN: 53 mL/min — AB (ref >60)
GLUCOSE: 97 mg/dL (ref 65–99)
Potassium: 4 mmol/L (ref 3.5–5.1)
Sodium: 141 mmol/L (ref 135–145)

## 2016-10-12 LAB — PROTIME-INR
INR: 0.99
PROTHROMBIN TIME: 13.1 s (ref 11.4–15.2)

## 2016-10-12 LAB — SURGICAL PCR SCREEN
MRSA, PCR: NEGATIVE
STAPHYLOCOCCUS AUREUS: NEGATIVE

## 2016-10-12 LAB — APTT: APTT: 32 s (ref 24–36)

## 2016-10-12 NOTE — Pre-Procedure Instructions (Addendum)
[name] [place]  10/12/2016      [facility] - [place], [place] [address] Phone: [phone] Fax: [phone]  [facility] [place], [place] [id number] [place] [id number] Phone: [phone] Fax: [phone]  [place], [place] [address] Phone: [phone] Fax: [phone]    Your procedure is scheduled on Oct 31.  Report to [facility] Admitting at 800 A.M.  Call this number if you have problems the morning of surgery:  [phone]   Remember:  Do not eat food or drink liquids after midnight.  Take these medicines the morning of surgery with A SIP OF WATER Gabapentin (Neurontin), Nefazodone (Serzone), Omeprazole (Prilosec), Topiramate (Topamax)  Stop taking Asprin, Ibuprofen, Advil, Motrin, Aleve, Herbal medications, Fish Oil, BC's, Goodys   Do not wear jewelry, make-up or nail polish.  Do not wear lotions, powders, or perfumes, or deoderant.  Do not shave 48 hours prior to surgery.  Men may shave face and neck.  Do not bring valuables to the hospital.  [facility] is not responsible for any belongings or valuables.  Contacts, dentures or bridgework may not be worn into surgery.  Leave your suitcase in the car.  After surgery it may be brought to your room.  For patients admitted to the hospital, discharge time will be determined by your treatment team.  Patients discharged the day of surgery will not be allowed to drive home.    Special instructions:   - Preparing for Surgery  Before surgery, you can play an important role.  Because skin is not sterile, your skin needs to be as free of germs as possible.  You can reduce the number of germs on you skin by washing with CHG (chlorahexidine gluconate) soap before surgery.  CHG is an antiseptic cleaner which kills germs  and bonds with the skin to continue killing germs even after washing.  Please DO NOT use if you have an allergy to CHG or antibacterial soaps.  If your skin becomes reddened/irritated stop using the CHG and inform your nurse when you arrive at Short Stay.  Do not shave (including legs and underarms) for at least 48 hours prior to the first CHG shower.  You may shave your face.  Please follow these instructions carefully:   1.  Shower with CHG Soap the night before surgery and the    morning of Surgery.  2.  If you choose to wash your hair, wash your hair first as usual with your       normal shampoo.  3.  After you shampoo, rinse your hair and body thoroughly to remove the                      Shampoo.  4.  Use CHG as you would any other liquid soap.  You can apply chg directly       to the skin and wash gently with scrungie or a clean washcloth.  5.  Apply the CHG Soap to your body ONLY FROM THE NECK DOWN.        Do not use on open wounds or open sores.  Avoid contact with your eyes,       ears, mouth and genitals (private parts).  Wash genitals (private parts)  with your normal soap.  6.  Wash thoroughly, paying special attention to the area where your surgery        will be performed.  7.  Thoroughly rinse your body with warm water from the neck down.  8.  DO NOT shower/wash with your normal soap after using and rinsing off       the CHG Soap.  9.  Pat yourself dry with a clean towel.            10.  Wear clean pajamas.            11.  Place clean sheets on your bed the night of your first shower and do not        sleep with pets.  Day of Surgery  Do not apply any lotions/deoderants the morning of surgery.  Please wear clean clothes to the hospital/surgery center.     Please read over the following fact sheets that you were given. Pain Booklet, Coughing and Deep Breathing, MRSA Information and Surgical Site Infection Prevention

## 2016-10-12 NOTE — Progress Notes (Signed)
PCP is Dr Harlan Stains Denies ever seeing a Cardiologist. Denies ever having a stress test, card cath, echo. Denies any chest pain. Marland Kitchen

## 2016-10-13 LAB — URINE CULTURE: Culture: NO GROWTH

## 2016-10-22 MED ORDER — TRANEXAMIC ACID 1000 MG/10ML IV SOLN
1000.0000 mg | INTRAVENOUS | Status: AC
  Administered 2016-10-23: 1000 mg via INTRAVENOUS
  Filled 2016-10-22: qty 10

## 2016-10-22 MED ORDER — TRANEXAMIC ACID 1000 MG/10ML IV SOLN
1000.0000 mg | INTRAVENOUS | Status: DC
  Filled 2016-10-22: qty 10

## 2016-10-23 ENCOUNTER — Encounter (HOSPITAL_COMMUNITY): Payer: Self-pay | Admitting: *Deleted

## 2016-10-23 ENCOUNTER — Inpatient Hospital Stay (HOSPITAL_COMMUNITY): Payer: Commercial Managed Care - HMO | Admitting: Anesthesiology

## 2016-10-23 ENCOUNTER — Inpatient Hospital Stay (HOSPITAL_COMMUNITY)
Admission: RE | Admit: 2016-10-23 | Discharge: 2016-10-25 | DRG: 470 | Disposition: A | Discharge status: Home Health | Payer: Commercial Managed Care - HMO | Source: Ambulatory Visit | Attending: Orthopedic Surgery | Admitting: Orthopedic Surgery

## 2016-10-23 ENCOUNTER — Inpatient Hospital Stay (HOSPITAL_COMMUNITY): Payer: Commercial Managed Care - HMO

## 2016-10-23 ENCOUNTER — Encounter (HOSPITAL_COMMUNITY)
Admission: RE | Disposition: A | Discharge status: Home Health | Payer: Self-pay | Source: Ambulatory Visit | Attending: Orthopedic Surgery

## 2016-10-23 DIAGNOSIS — M549 Dorsalgia, unspecified: Secondary | ICD-10-CM | POA: Diagnosis not present

## 2016-10-23 DIAGNOSIS — R2689 Other abnormalities of gait and mobility: Secondary | ICD-10-CM

## 2016-10-23 DIAGNOSIS — Z87442 Personal history of urinary calculi: Secondary | ICD-10-CM | POA: Diagnosis not present

## 2016-10-23 DIAGNOSIS — M25551 Pain in right hip: Secondary | ICD-10-CM | POA: Diagnosis present

## 2016-10-23 DIAGNOSIS — Z915 Personal history of self-harm: Secondary | ICD-10-CM

## 2016-10-23 DIAGNOSIS — D62 Acute posthemorrhagic anemia: Secondary | ICD-10-CM | POA: Diagnosis not present

## 2016-10-23 DIAGNOSIS — M1611 Unilateral primary osteoarthritis, right hip: Secondary | ICD-10-CM

## 2016-10-23 DIAGNOSIS — F259 Schizoaffective disorder, unspecified: Secondary | ICD-10-CM | POA: Diagnosis not present

## 2016-10-23 DIAGNOSIS — F419 Anxiety disorder, unspecified: Secondary | ICD-10-CM | POA: Diagnosis present

## 2016-10-23 DIAGNOSIS — F329 Major depressive disorder, single episode, unspecified: Secondary | ICD-10-CM | POA: Diagnosis not present

## 2016-10-23 DIAGNOSIS — M160 Bilateral primary osteoarthritis of hip: Secondary | ICD-10-CM | POA: Diagnosis present

## 2016-10-23 DIAGNOSIS — K219 Gastro-esophageal reflux disease without esophagitis: Secondary | ICD-10-CM | POA: Diagnosis present

## 2016-10-23 DIAGNOSIS — Z96641 Presence of right artificial hip joint: Secondary | ICD-10-CM | POA: Diagnosis not present

## 2016-10-23 DIAGNOSIS — G43909 Migraine, unspecified, not intractable, without status migrainosus: Secondary | ICD-10-CM | POA: Diagnosis present

## 2016-10-23 DIAGNOSIS — F603 Borderline personality disorder: Secondary | ICD-10-CM | POA: Diagnosis present

## 2016-10-23 DIAGNOSIS — Z79899 Other long term (current) drug therapy: Secondary | ICD-10-CM

## 2016-10-23 DIAGNOSIS — G8929 Other chronic pain: Secondary | ICD-10-CM | POA: Diagnosis present

## 2016-10-23 DIAGNOSIS — M169 Osteoarthritis of hip, unspecified: Secondary | ICD-10-CM | POA: Diagnosis not present

## 2016-10-23 DIAGNOSIS — Z471 Aftercare following joint replacement surgery: Secondary | ICD-10-CM | POA: Diagnosis not present

## 2016-10-23 DIAGNOSIS — M6281 Muscle weakness (generalized): Secondary | ICD-10-CM

## 2016-10-23 DIAGNOSIS — Z419 Encounter for procedure for purposes other than remedying health state, unspecified: Secondary | ICD-10-CM

## 2016-10-23 HISTORY — DX: Hypotension, unspecified: I95.9

## 2016-10-23 HISTORY — PX: TOTAL HIP ARTHROPLASTY: SHX124

## 2016-10-23 SURGERY — ARTHROPLASTY, HIP, TOTAL, ANTERIOR APPROACH
Anesthesia: General | Site: Hip | Laterality: Right

## 2016-10-23 MED ORDER — ONDANSETRON HCL 4 MG/2ML IJ SOLN: 4.0000 mg | Freq: Four times a day (QID) | INTRAMUSCULAR | Status: DC | PRN

## 2016-10-23 MED ORDER — KETOROLAC TROMETHAMINE 30 MG/ML IJ SOLN
INTRAMUSCULAR | Status: AC
  Filled 2016-10-23: qty 1

## 2016-10-23 MED ORDER — 0.9 % SODIUM CHLORIDE (POUR BTL) OPTIME
TOPICAL | Status: DC | PRN
  Administered 2016-10-23: 1000 mL

## 2016-10-23 MED ORDER — ARTIFICIAL TEARS OP OINT
TOPICAL_OINTMENT | OPHTHALMIC | Status: AC
  Filled 2016-10-23: qty 3.5

## 2016-10-23 MED ORDER — PHENYLEPHRINE HCL 10 MG/ML IJ SOLN
INTRAVENOUS | Status: DC | PRN
  Administered 2016-10-23: 20 ug/min via INTRAVENOUS

## 2016-10-23 MED ORDER — TRAMADOL HCL 50 MG PO TABS: 50.0000 mg | ORAL_TABLET | Freq: Four times a day (QID) | ORAL | 0 refills | Status: DC | PRN

## 2016-10-23 MED ORDER — MENTHOL 3 MG MT LOZG: 1.0000 | LOZENGE | OROMUCOSAL | Status: DC | PRN

## 2016-10-23 MED ORDER — CEFAZOLIN SODIUM-DEXTROSE 2-4 GM/100ML-% IV SOLN
2.0000 g | INTRAVENOUS | Status: AC
  Administered 2016-10-23: 2 g via INTRAVENOUS
  Filled 2016-10-23: qty 100

## 2016-10-23 MED ORDER — METHOCARBAMOL 1000 MG/10ML IJ SOLN
500.0000 mg | Freq: Four times a day (QID) | INTRAVENOUS | Status: DC | PRN
  Filled 2016-10-23 (×2): qty 5

## 2016-10-23 MED ORDER — ACETAMINOPHEN 500 MG PO TABS
1000.0000 mg | ORAL_TABLET | Freq: Once | ORAL | Status: AC
  Administered 2016-10-23: 1000 mg via ORAL
  Filled 2016-10-23: qty 2

## 2016-10-23 MED ORDER — CEFAZOLIN IN D5W 1 GM/50ML IV SOLN
1.0000 g | Freq: Four times a day (QID) | INTRAVENOUS | Status: AC
  Administered 2016-10-23 (×2): 1 g via INTRAVENOUS
  Filled 2016-10-23 (×2): qty 50

## 2016-10-23 MED ORDER — METHOCARBAMOL 500 MG PO TABS: 500.0000 mg | ORAL_TABLET | Freq: Four times a day (QID) | ORAL | 0 refills | Status: DC | PRN

## 2016-10-23 MED ORDER — LIDOCAINE HCL (CARDIAC) 20 MG/ML IV SOLN
INTRAVENOUS | Status: DC | PRN
  Administered 2016-10-23: 80 mg via INTRAVENOUS

## 2016-10-23 MED ORDER — MIDAZOLAM HCL 2 MG/2ML IJ SOLN
INTRAMUSCULAR | Status: AC
  Filled 2016-10-23: qty 2

## 2016-10-23 MED ORDER — ALBUMIN HUMAN 5 % IV SOLN
INTRAVENOUS | Status: DC | PRN
  Administered 2016-10-23: 12:00:00 via INTRAVENOUS

## 2016-10-23 MED ORDER — GABAPENTIN 300 MG PO CAPS
300.0000 mg | ORAL_CAPSULE | Freq: Once | ORAL | Status: AC
Start: 2016-10-23 — End: 2016-10-23
  Administered 2016-10-23: 300 mg via ORAL
  Filled 2016-10-23: qty 1

## 2016-10-23 MED ORDER — VALACYCLOVIR HCL 500 MG PO TABS
500.0000 mg | ORAL_TABLET | Freq: Every day | ORAL | Status: DC
  Administered 2016-10-23 – 2016-10-24 (×2): 500 mg via ORAL
  Filled 2016-10-23 (×2): qty 1

## 2016-10-23 MED ORDER — SODIUM CHLORIDE FLUSH 0.9 % IV SOLN
INTRAVENOUS | Status: DC | PRN
  Administered 2016-10-23: 30 mL

## 2016-10-23 MED ORDER — RIVAROXABAN 10 MG PO TABS: 10.0000 mg | ORAL_TABLET | Freq: Every day | ORAL | 0 refills | Status: DC

## 2016-10-23 MED ORDER — DIPHENHYDRAMINE HCL 12.5 MG/5ML PO ELIX: 12.5000 mg | ORAL_SOLUTION | ORAL | Status: DC | PRN

## 2016-10-23 MED ORDER — BUPIVACAINE-EPINEPHRINE (PF) 0.5% -1:200000 IJ SOLN
INTRAMUSCULAR | Status: AC
  Filled 2016-10-23: qty 30

## 2016-10-23 MED ORDER — KETOROLAC TROMETHAMINE 30 MG/ML IJ SOLN
INTRAMUSCULAR | Status: DC | PRN
  Administered 2016-10-23: 30 mg via INTRA_ARTICULAR

## 2016-10-23 MED ORDER — DEXTROSE-NACL 5-0.45 % IV SOLN
INTRAVENOUS | Status: DC
  Administered 2016-10-25: 02:00:00 via INTRAVENOUS

## 2016-10-23 MED ORDER — LACTATED RINGERS IV SOLN: INTRAVENOUS | Status: DC

## 2016-10-23 MED ORDER — NYSTATIN 100000 UNIT/GM EX CREA
TOPICAL_CREAM | Freq: Two times a day (BID) | CUTANEOUS | Status: DC
  Filled 2016-10-23: qty 15

## 2016-10-23 MED ORDER — MIDAZOLAM HCL 5 MG/5ML IJ SOLN
INTRAMUSCULAR | Status: DC | PRN
Start: 2016-10-23 — End: 2016-10-23
  Administered 2016-10-23: 2 mg via INTRAVENOUS

## 2016-10-23 MED ORDER — RIVAROXABAN 10 MG PO TABS
10.0000 mg | ORAL_TABLET | Freq: Every day | ORAL | Status: DC
  Administered 2016-10-24 – 2016-10-25 (×2): 10 mg via ORAL
  Filled 2016-10-23 (×2): qty 1

## 2016-10-23 MED ORDER — TOPIRAMATE 100 MG PO TABS
250.0000 mg | ORAL_TABLET | Freq: Every day | ORAL | Status: DC
Start: 2016-10-24 — End: 2016-10-25
  Administered 2016-10-24 – 2016-10-25 (×2): 250 mg via ORAL
  Filled 2016-10-23 (×2): qty 2

## 2016-10-23 MED ORDER — DOCUSATE SODIUM 100 MG PO CAPS
100.0000 mg | ORAL_CAPSULE | Freq: Two times a day (BID) | ORAL | Status: DC
  Administered 2016-10-23 – 2016-10-25 (×4): 100 mg via ORAL
  Filled 2016-10-23 (×4): qty 1

## 2016-10-23 MED ORDER — NEFAZODONE HCL 200 MG PO TABS
400.0000 mg | ORAL_TABLET | Freq: Every day | ORAL | Status: DC
  Administered 2016-10-24 – 2016-10-25 (×2): 400 mg via ORAL
  Filled 2016-10-23 (×2): qty 2

## 2016-10-23 MED ORDER — TRAMADOL HCL 50 MG PO TABS
50.0000 mg | ORAL_TABLET | Freq: Four times a day (QID) | ORAL | Status: DC | PRN
  Administered 2016-10-23: 100 mg via ORAL
  Administered 2016-10-23: 50 mg via ORAL
  Administered 2016-10-24 – 2016-10-25 (×3): 100 mg via ORAL
  Filled 2016-10-23 (×4): qty 2

## 2016-10-23 MED ORDER — ROCURONIUM BROMIDE 10 MG/ML (PF) SYRINGE
PREFILLED_SYRINGE | INTRAVENOUS | Status: AC
  Filled 2016-10-23: qty 10

## 2016-10-23 MED ORDER — ONDANSETRON HCL 4 MG/2ML IJ SOLN
INTRAMUSCULAR | Status: DC | PRN
  Administered 2016-10-23: 4 mg via INTRAVENOUS

## 2016-10-23 MED ORDER — LACTATED RINGERS IV SOLN
INTRAVENOUS | Status: DC
  Administered 2016-10-23 (×2): via INTRAVENOUS

## 2016-10-23 MED ORDER — METHOCARBAMOL 500 MG PO TABS
500.0000 mg | ORAL_TABLET | Freq: Four times a day (QID) | ORAL | Status: DC | PRN
  Administered 2016-10-23 – 2016-10-25 (×2): 500 mg via ORAL
  Filled 2016-10-23: qty 1

## 2016-10-23 MED ORDER — LIDOCAINE 2% (20 MG/ML) 5 ML SYRINGE
INTRAMUSCULAR | Status: AC
Start: 2016-10-23 — End: 2016-10-23
  Filled 2016-10-23: qty 5

## 2016-10-23 MED ORDER — SUGAMMADEX SODIUM 200 MG/2ML IV SOLN
INTRAVENOUS | Status: AC
  Filled 2016-10-23: qty 2

## 2016-10-23 MED ORDER — ACETAMINOPHEN 325 MG PO TABS
650.0000 mg | ORAL_TABLET | Freq: Four times a day (QID) | ORAL | Status: AC
  Administered 2016-10-23 – 2016-10-24 (×3): 650 mg via ORAL
  Filled 2016-10-23 (×3): qty 2

## 2016-10-23 MED ORDER — BUPIVACAINE-EPINEPHRINE 0.5% -1:200000 IJ SOLN
INTRAMUSCULAR | Status: DC | PRN
  Administered 2016-10-23: 15 mL

## 2016-10-23 MED ORDER — KETOROLAC TROMETHAMINE 15 MG/ML IJ SOLN
7.5000 mg | Freq: Four times a day (QID) | INTRAMUSCULAR | Status: AC
  Administered 2016-10-23 – 2016-10-24 (×3): 7.5 mg via INTRAVENOUS
  Filled 2016-10-23 (×3): qty 1

## 2016-10-23 MED ORDER — PHENOL 1.4 % MT LIQD: 1.0000 | OROMUCOSAL | Status: DC | PRN

## 2016-10-23 MED ORDER — FENTANYL CITRATE (PF) 100 MCG/2ML IJ SOLN
INTRAMUSCULAR | Status: DC | PRN
  Administered 2016-10-23 (×2): 50 ug via INTRAVENOUS

## 2016-10-23 MED ORDER — HYDROMORPHONE HCL 1 MG/ML IJ SOLN: 0.2500 mg | INTRAMUSCULAR | Status: DC | PRN

## 2016-10-23 MED ORDER — ACETAMINOPHEN 325 MG PO TABS: 650.0000 mg | ORAL_TABLET | Freq: Four times a day (QID) | ORAL | Status: DC | PRN

## 2016-10-23 MED ORDER — DEXAMETHASONE SODIUM PHOSPHATE 10 MG/ML IJ SOLN
10.0000 mg | Freq: Once | INTRAMUSCULAR | Status: AC
  Administered 2016-10-24: 10 mg via INTRAVENOUS
  Filled 2016-10-23: qty 1

## 2016-10-23 MED ORDER — POLYETHYLENE GLYCOL 3350 17 G PO PACK
17.0000 g | PACK | Freq: Every day | ORAL | Status: DC | PRN
  Filled 2016-10-23: qty 1

## 2016-10-23 MED ORDER — TRAZODONE HCL 50 MG PO TABS
50.0000 mg | ORAL_TABLET | Freq: Every day | ORAL | Status: DC
  Administered 2016-10-24: 50 mg via ORAL
  Filled 2016-10-23 (×2): qty 1

## 2016-10-23 MED ORDER — PROPOFOL 10 MG/ML IV BOLUS
INTRAVENOUS | Status: AC
Start: 2016-10-23 — End: 2016-10-23
  Filled 2016-10-23: qty 20

## 2016-10-23 MED ORDER — SODIUM CHLORIDE 0.9 % IV SOLN
2000.0000 mg | INTRAVENOUS | Status: AC
  Administered 2016-10-23: 2000 mg via TOPICAL
  Filled 2016-10-23: qty 20

## 2016-10-23 MED ORDER — METOCLOPRAMIDE HCL 5 MG/ML IJ SOLN: 5.0000 mg | Freq: Three times a day (TID) | INTRAMUSCULAR | Status: DC | PRN

## 2016-10-23 MED ORDER — PROPOFOL 10 MG/ML IV BOLUS
INTRAVENOUS | Status: DC | PRN
  Administered 2016-10-23: 150 mg via INTRAVENOUS

## 2016-10-23 MED ORDER — FENTANYL CITRATE (PF) 100 MCG/2ML IJ SOLN
INTRAMUSCULAR | Status: AC
  Filled 2016-10-23: qty 2

## 2016-10-23 MED ORDER — ACETAMINOPHEN 650 MG RE SUPP: 650.0000 mg | Freq: Four times a day (QID) | RECTAL | Status: DC | PRN

## 2016-10-23 MED ORDER — CLONAZEPAM 1 MG PO TABS
2.0000 mg | ORAL_TABLET | Freq: Every day | ORAL | Status: DC
  Administered 2016-10-23: 1 mg via ORAL
  Administered 2016-10-24: 2 mg via ORAL
  Filled 2016-10-23 (×2): qty 2

## 2016-10-23 MED ORDER — PANTOPRAZOLE SODIUM 40 MG PO TBEC
40.0000 mg | DELAYED_RELEASE_TABLET | Freq: Every day | ORAL | Status: DC
  Administered 2016-10-24 – 2016-10-25 (×2): 40 mg via ORAL
  Filled 2016-10-23 (×2): qty 1

## 2016-10-23 MED ORDER — GABAPENTIN 300 MG PO CAPS
300.0000 mg | ORAL_CAPSULE | Freq: Three times a day (TID) | ORAL | Status: DC
  Administered 2016-10-23 – 2016-10-25 (×7): 300 mg via ORAL
  Filled 2016-10-23 (×7): qty 1

## 2016-10-23 MED ORDER — CHLORHEXIDINE GLUCONATE 4 % EX LIQD: 60.0000 mL | Freq: Once | CUTANEOUS | Status: DC

## 2016-10-23 MED ORDER — ONDANSETRON HCL 4 MG PO TABS: 4.0000 mg | ORAL_TABLET | Freq: Four times a day (QID) | ORAL | Status: DC | PRN

## 2016-10-23 MED ORDER — SUGAMMADEX SODIUM 200 MG/2ML IV SOLN
INTRAVENOUS | Status: DC | PRN
  Administered 2016-10-23: 200 mg via INTRAVENOUS

## 2016-10-23 MED ORDER — SORBITOL 70 % SOLN: 30.0000 mL | Freq: Every day | Status: DC | PRN

## 2016-10-23 MED ORDER — ROCURONIUM BROMIDE 100 MG/10ML IV SOLN
INTRAVENOUS | Status: DC | PRN
  Administered 2016-10-23: 20 mg via INTRAVENOUS
  Administered 2016-10-23: 30 mg via INTRAVENOUS
  Administered 2016-10-23: 40 mg via INTRAVENOUS

## 2016-10-23 MED ORDER — TRAMADOL HCL 50 MG PO TABS
ORAL_TABLET | ORAL | Status: AC
  Filled 2016-10-23: qty 1

## 2016-10-23 MED ORDER — METHOCARBAMOL 500 MG PO TABS
ORAL_TABLET | ORAL | Status: AC
  Filled 2016-10-23: qty 1

## 2016-10-23 MED ORDER — SENNA 8.6 MG PO TABS
1.0000 | ORAL_TABLET | Freq: Two times a day (BID) | ORAL | Status: DC
  Administered 2016-10-23 – 2016-10-25 (×4): 8.6 mg via ORAL
  Filled 2016-10-23 (×4): qty 1

## 2016-10-23 MED ORDER — METOCLOPRAMIDE HCL 5 MG PO TABS: 5.0000 mg | ORAL_TABLET | Freq: Three times a day (TID) | ORAL | Status: DC | PRN

## 2016-10-23 MED ORDER — DOCUSATE SODIUM 100 MG PO CAPS: 100.0000 mg | ORAL_CAPSULE | Freq: Two times a day (BID) | ORAL | 0 refills | Status: DC

## 2016-10-23 MED ORDER — FLEET ENEMA 7-19 GM/118ML RE ENEM: 1.0000 | ENEMA | Freq: Once | RECTAL | Status: DC | PRN

## 2016-10-23 MED ORDER — ONDANSETRON HCL 4 MG PO TABS: 4.0000 mg | ORAL_TABLET | Freq: Three times a day (TID) | ORAL | 0 refills | Status: DC | PRN

## 2016-10-23 SURGICAL SUPPLY — 51 items
APL SKNCLS STERI-STRIP NONHPOA (GAUZE/BANDAGES/DRESSINGS) ×1
BAG DECANTER FOR FLEXI CONT (MISCELLANEOUS) ×2 IMPLANT
BENZOIN TINCTURE PRP APPL 2/3 (GAUZE/BANDAGES/DRESSINGS) ×2 IMPLANT
BLADE SAG 18X100X1.27 (BLADE) IMPLANT
BLADE SAW SGTL 18X1.27X75 (BLADE) IMPLANT
BLADE SAW SGTL 18X1.27X75MM (BLADE)
CAPT HIP TOTAL 2 ×3 IMPLANT
CLOSURE STERI-STRIP 1/2X4 (GAUZE/BANDAGES/DRESSINGS) ×1
CLSR STERI-STRIP ANTIMIC 1/2X4 (GAUZE/BANDAGES/DRESSINGS) ×2 IMPLANT
COVER PERINEAL POST (MISCELLANEOUS) ×3 IMPLANT
COVER SURGICAL LIGHT HANDLE (MISCELLANEOUS) ×3 IMPLANT
DRAPE C-ARM 42X72 X-RAY (DRAPES) ×3 IMPLANT
DRAPE STERI IOBAN 125X83 (DRAPES) ×3 IMPLANT
DRAPE U-SHAPE 47X51 STRL (DRAPES) IMPLANT
DRSG MEPILEX BORDER 4X8 (GAUZE/BANDAGES/DRESSINGS) ×3 IMPLANT
DURAPREP 26ML APPLICATOR (WOUND CARE) ×3 IMPLANT
ELECT BLADE 4.0 EZ CLEAN MEGAD (MISCELLANEOUS) ×3
ELECT REM PT RETURN 9FT ADLT (ELECTROSURGICAL) ×3
ELECTRODE BLDE 4.0 EZ CLN MEGD (MISCELLANEOUS) ×1 IMPLANT
ELECTRODE REM PT RTRN 9FT ADLT (ELECTROSURGICAL) ×1 IMPLANT
FACESHIELD WRAPAROUND (MASK) ×12 IMPLANT
FACESHIELD WRAPAROUND OR TEAM (MASK) ×2 IMPLANT
GLOVE BIO SURGEON STRL SZ7.5 (GLOVE) ×6 IMPLANT
GLOVE BIOGEL PI IND STRL 8 (GLOVE) ×2 IMPLANT
GLOVE BIOGEL PI INDICATOR 8 (GLOVE) ×4
GOWN STRL REUS W/ TWL LRG LVL3 (GOWN DISPOSABLE) ×2 IMPLANT
GOWN STRL REUS W/TWL LRG LVL3 (GOWN DISPOSABLE) ×6
KIT BASIN OR (CUSTOM PROCEDURE TRAY) ×3 IMPLANT
KIT ROOM TURNOVER OR (KITS) ×3 IMPLANT
MANIFOLD NEPTUNE II (INSTRUMENTS) ×3 IMPLANT
NDL SAFETY ECLIPSE 18X1.5 (NEEDLE) IMPLANT
NEEDLE HYPO 18GX1.5 SHARP (NEEDLE) ×3
NEEDLE HYPO 22GX1.5 SAFETY (NEEDLE) ×3 IMPLANT
NS IRRIG 1000ML POUR BTL (IV SOLUTION) ×3 IMPLANT
PACK TOTAL JOINT (CUSTOM PROCEDURE TRAY) ×3 IMPLANT
PACK UNIVERSAL I (CUSTOM PROCEDURE TRAY) ×3 IMPLANT
PAD ARMBOARD 7.5X6 YLW CONV (MISCELLANEOUS) ×3 IMPLANT
SPONGE LAP 18X18 X RAY DECT (DISPOSABLE) IMPLANT
SUT MNCRL AB 4-0 PS2 18 (SUTURE) ×3 IMPLANT
SUT MON AB 2-0 CT1 36 (SUTURE) ×3 IMPLANT
SUT VIC AB 0 CT1 27 (SUTURE) ×3
SUT VIC AB 0 CT1 27XBRD ANBCTR (SUTURE) ×1 IMPLANT
SUT VIC AB 1 CT1 27 (SUTURE) ×3
SUT VIC AB 1 CT1 27XBRD ANBCTR (SUTURE) ×1 IMPLANT
SYR 50ML LL SCALE MARK (SYRINGE) ×3 IMPLANT
SYRINGE 20CC LL (MISCELLANEOUS) IMPLANT
TOWEL OR 17X24 6PK STRL BLUE (TOWEL DISPOSABLE) ×3 IMPLANT
TOWEL OR 17X26 10 PK STRL BLUE (TOWEL DISPOSABLE) ×3 IMPLANT
TRAY FOLEY CATH 16FRSI W/METER (SET/KITS/TRAYS/PACK) IMPLANT
WATER STERILE IRR 1000ML POUR (IV SOLUTION) ×1 IMPLANT
YANKAUER SUCT BULB TIP NO VENT (SUCTIONS) ×3 IMPLANT

## 2016-10-23 NOTE — Op Note (Signed)
10/23/2016  11:58 AM  PATIENT:  [name]   MRN: [id number]  PRE-OPERATIVE DIAGNOSIS:  OA RIGHT HIP  POST-OPERATIVE DIAGNOSIS:  OA RIGHT HIP  PROCEDURE:  Procedure(s): TOTAL HIP ARTHROPLASTY ANTERIOR APPROACH  PREOPERATIVE INDICATIONS:    [name] is an 60 y.o. female who has a diagnosis of Primary osteoarthritis of right hip and elected for surgical management after failing conservative treatment.  The risks benefits and alternatives were discussed with the patient including but not limited to the risks of nonoperative treatment, versus surgical intervention including infection, bleeding, nerve injury, periprosthetic fracture, the need for revision surgery, dislocation, leg length discrepancy, blood clots, cardiopulmonary complications, morbidity, mortality, among others, and they were willing to proceed.     OPERATIVE REPORT     SURGEON:   MURPHY, Ernesta Amble, MD    ASSISTANT:  Roxan Hockey, PA-C, he was present and scrubbed throughout the case, critical for completion in a timely fashion, and for retraction, instrumentation, and closure.     ANESTHESIA:  General    COMPLICATIONS:  None.     COMPONENTS:  [employer] acolade fit femur size 5 with a 36 mm -5 head ball and a PSL acetabular shell size 48 with a  polyethylene liner    PROCEDURE IN DETAIL:   The patient was met in the holding area and  identified.  The appropriate hip was identified and marked at the operative site.  The patient was then transported to the OR  and  placed under anesthesia per that record.  At that point, the patient was  placed in the supine position and  secured to the operating room table and all bony prominences padded. He received pre-operative antibiotics    The operative lower extremity was prepped from the iliac crest to the distal leg.  Sterile draping was performed.  Time out was performed prior to incision.      Skin incision was made just 2 cm lateral to the ASIS  extending in  line with the tensor fascia lata. Electrocautery was used to control all bleeders. I dissected down sharply to the fascia of the tensor fascia lata was confirmed that the muscle fibers beneath were running posteriorly. I then incised the fascia over the superficial tensor fascia lata in line with the incision. The fascia was elevated off the anterior aspect of the muscle the muscle was retracted posteriorly and protected throughout the case. I then used electrocautery to incise the tensor fascia lata fascia control and all bleeders. Immediately visible was the fat over top of the anterior neck and capsule.  I removed the anterior fat from the capsule and elevated the rectus muscle off of the anterior capsule. I then removed a large time of capsule. The retractors were then placed over the anterior acetabulum as well as around the superior and inferior neck.  I then removed a section of the femoral neck and a napkin ring fashion. Then used the power course to remove the femoral head from the acetabulum and thoroughly irrigated the acetabulum. I sized the femoral head.    I then exposed the deep acetabulum, cleared out any tissue including the ligamentum teres.   After adequate visualization, I excised the labrum, and then sequentially reamed.  I then impacted the acetabular implant into place using fluoroscopy for guidance.  Appropriate version and inclination was confirmed clinically matching their bony anatomy, and with fluoroscopy.  I placed a 4m screw in the posterior/superio position with an excellent bite.  I then placed the polyethylene liner in place  I then adducted the leg and released the external rotators from the posterior femur allowing it to be easily delivered up lateral and anterior to the acetabulum for preparation of the femoral canal.    I then prepared the proximal femur using the cookie-cutter and then sequentially reamed and broached.  A trial broach, neck, and head was  utilized, and I reduced the hip and used floroscopy to assess the neck length and femoral implant.  I then impacted the femoral prosthesis into place into the appropriate version. The hip was then reduced and fluoroscopy confirmed appropriate position. Leg lengths were restored.  I then irrigated the hip copiously again with, and repaired the fascia with Vicryl, followed by monocryl for the subcutaneous tissue, Monocryl for the skin, Steri-Strips and sterile gauze. The patient was then awakened and returned to PACU in stable and satisfactory condition. There were no complications.  POST OPERATIVE PLAN: WBAT, DVT px: SCD's/TED and Xarelto  Timothy Murphy, MD Orthopedic Surgeon [phone]     

## 2016-10-23 NOTE — Anesthesia Preprocedure Evaluation (Addendum)
Anesthesia Evaluation  Patient identified by MRN, date of birth, ID band Patient awake    Reviewed: Allergy & Precautions, NPO status , Patient's Chart, lab work & pertinent test results  Airway Mallampati: II       Dental  (+) Teeth Intact   Pulmonary shortness of breath, former smoker,    breath sounds clear to auscultation       Cardiovascular negative cardio ROS   Rhythm:Regular Rate:Normal     Neuro/Psych  Headaches, Anxiety Depression  Neuromuscular disease    GI/Hepatic Neg liver ROS, GERD  ,  Endo/Other  negative endocrine ROS  Renal/GU negative Renal ROS     Musculoskeletal  (+) Arthritis ,   Abdominal   Peds  Hematology   Anesthesia Other Findings   Reproductive/Obstetrics                            Anesthesia Physical Anesthesia Plan  ASA: II  Anesthesia Plan: General   Post-op Pain Management:    Induction: Intravenous  Airway Management Planned: Oral ETT  Additional Equipment:   Intra-op Plan:   Post-operative Plan: Extubation in OR  Informed Consent: I have reviewed the patients History and Physical, chart, labs and discussed the procedure including the risks, benefits and alternatives for the proposed anesthesia with the patient or authorized representative who has indicated his/her understanding and acceptance.   Dental advisory given  Plan Discussed with: Anesthesiologist and CRNA  Anesthesia Plan Comments:        Anesthesia Quick Evaluation

## 2016-10-23 NOTE — Progress Notes (Signed)
Pt presented from PACU resting with eyes closed, arousable. No indication of pain. Ice pack to right hip with dry dressing in place. No breakthru bleeding noted. Respirations even and non labored. No distress noted. Call bell at side. SCD's in use

## 2016-10-23 NOTE — Anesthesia Postprocedure Evaluation (Signed)
Anesthesia Post Note  Patient: [name]  Procedure(s) Performed: Procedure(s) (LRB): TOTAL HIP ARTHROPLASTY ANTERIOR APPROACH (Right)  Patient location during evaluation: PACU Anesthesia Type: General Level of consciousness: awake Pain management: pain level controlled Vital Signs Assessment: post-procedure vital signs reviewed and stable Respiratory status: spontaneous breathing Cardiovascular status: stable Anesthetic complications: no    Last Vitals:  Vitals:   10/23/16 1312 10/23/16 1315  BP: (!) 96/59   Pulse: 63 63  Resp: 10 12  Temp:      Last Pain:  Vitals:   10/23/16 1242  TempSrc:   PainSc: Asleep    LLE Motor Response: Purposeful movement (10/23/16 1312) LLE Sensation: Full sensation (10/23/16 1312) RLE Motor Response: Purposeful movement (10/23/16 1312) RLE Sensation: Full sensation (10/23/16 1312)      EDWARDS,Kena Limon

## 2016-10-23 NOTE — Interval H&P Note (Signed)
History and Physical Interval Note:  10/23/2016 9:21 AM  [name]  has presented today for surgery, with the diagnosis of OA RIGHT HIP  The various methods of treatment have been discussed with the patient and family. After consideration of risks, benefits and other options for treatment, the patient has consented to  Procedure(s): TOTAL HIP ARTHROPLASTY ANTERIOR APPROACH (Right) as a surgical intervention .  [name] history has been reviewed, patient examined, no change in status, stable for surgery.  I have reviewed the patient's chart and labs.  Questions were answered to the patient's satisfaction.     [name]

## 2016-10-23 NOTE — Transfer of Care (Signed)
Immediate Anesthesia Transfer of Care Note  Patient: [name]  Procedure(s) Performed: Procedure(s): TOTAL HIP ARTHROPLASTY ANTERIOR APPROACH (Right)  Patient Location: PACU  Anesthesia Type:General  Level of Consciousness: awake, alert  and oriented  Airway & Oxygen Therapy: Patient Spontanous Breathing and Patient connected to nasal cannula oxygen  Post-op Assessment: Report given to RN, Post -op Vital signs reviewed and stable and Patient moving all extremities  Post vital signs: Reviewed and stable  Last Vitals:  Vitals:   10/23/16 0849 10/23/16 1242  BP: (!) 88/52   Pulse: 61   Temp: 36.6 C (P) 36.3 C    Last Pain:  Vitals:   10/23/16 1242  TempSrc:   PainSc: (P) Asleep         Complications: No apparent anesthesia complications

## 2016-10-23 NOTE — Anesthesia Procedure Notes (Signed)
Procedure Name: Intubation Date/Time: 10/23/2016 10:33 AM Performed by: Kyung Rudd Pre-anesthesia Checklist: Patient identified, Emergency Drugs available, Suction available and Patient being monitored Patient Re-evaluated:Patient Re-evaluated prior to inductionOxygen Delivery Method: Circle system utilized Preoxygenation: Pre-oxygenation with 100% oxygen Intubation Type: IV induction Ventilation: Mask ventilation without difficulty Laryngoscope Size: Mac and 3 Grade View: Grade I Tube type: Oral Tube size: 7.0 mm Number of attempts: 1 Airway Equipment and Method: Stylet Placement Confirmation: ETT inserted through vocal cords under direct vision,  positive ETCO2 and breath sounds checked- equal and bilateral Secured at: 21 cm Tube secured with: Tape Dental Injury: Teeth and Oropharynx as per pre-operative assessment

## 2016-10-24 LAB — BASIC METABOLIC PANEL
Anion gap: 3 — ABNORMAL LOW (ref 5–15)
BUN: 13 mg/dL (ref 6–20)
CALCIUM: 8.2 mg/dL — AB (ref 8.9–10.3)
CHLORIDE: 115 mmol/L — AB (ref 101–111)
CO2: 23 mmol/L (ref 22–32)
CREATININE: 1.01 mg/dL — AB (ref 0.44–1.00)
GFR, EST NON AFRICAN AMERICAN: 58 mL/min — AB (ref >60)
Glucose, Bld: 96 mg/dL (ref 65–99)
Potassium: 3.8 mmol/L (ref 3.5–5.1)
SODIUM: 141 mmol/L (ref 135–145)

## 2016-10-24 LAB — CBC
HCT: 23.1 % — ABNORMAL LOW (ref 36.0–46.0)
Hemoglobin: 7.6 g/dL — ABNORMAL LOW (ref 12.0–15.0)
MCH: 32.9 pg (ref 26.0–34.0)
MCHC: 32.9 g/dL (ref 30.0–36.0)
MCV: 100 fL (ref 78.0–100.0)
PLATELETS: 89 10*3/uL — AB (ref 150–400)
RBC: 2.31 MIL/uL — AB (ref 3.87–5.11)
RDW: 12.8 % (ref 11.5–15.5)
WBC: 6 10*3/uL (ref 4.0–10.5)

## 2016-10-24 NOTE — Evaluation (Addendum)
Physical Therapy Evaluation Patient Details Name: [name] MRN: [id number] DOB: 1956-02-13 Today's Date: 10/24/2016   History of Present Illness  60 yo female admitted on 10/23/16 for right THA anterior approach. PMH significant fo chronic back pain, anxiety and depression, suicidal attempt 2012.   Clinical Impression  Pt is post op day 1 and moving well with therapy but is mildly limited by pain. Pt demonstrated good demeanor and is eager to participate in therapy session. Requires some cues for environmental awareness with gait including scanning environment for obstacles. Pt reports some lightheadedness following gait activities which is quickly resolved when sitting and getting back into bed.    Follow Up Recommendations Home health PT    Equipment Recommendations  Rolling walker with 5" wheels;3in1 (PT)    Recommendations for Other Services       Precautions / Restrictions Precautions Precautions: None Precaution Comments: Anterior Hip, No precautions Restrictions Weight Bearing Restrictions: Yes RLE Weight Bearing: Weight bearing as tolerated      Mobility  Bed Mobility Overal bed mobility: Needs Assistance Bed Mobility: Supine to Sit;Sit to Supine     Supine to sit: Supervision Sit to supine: Supervision   General bed mobility comments: Pt able to get in and out bed without rails and bed flat.  Transfers Overall transfer level: Needs assistance Equipment used: Rolling walker (2 wheeled) Transfers: Sit to/from Stand Sit to Stand: Supervision         General transfer comment: VCs for hand placement  Ambulation/Gait Ambulation/Gait assistance: Min assist Ambulation Distance (Feet): 75 Feet Assistive device: Rolling walker (2 wheeled) Gait Pattern/deviations: Step-through pattern;Decreased stance time - right;Antalgic Gait velocity: decreased Gait velocity interpretation: <1.8 ft/sec, indicative of risk for recurrent falls General Gait Details: Mild  antalgic gait noted with RW, requires MIn A for safety and to maneuver IV  Stairs            Wheelchair Mobility    Modified Rankin (Stroke Patients Only)       Balance Overall balance assessment: Needs assistance Sitting-balance support: Feet supported Sitting balance-Leahy Scale: Good Sitting balance - Comments: Supervision to sit at EOB    Standing balance support: Bilateral upper extremity supported;During functional activity Standing balance-Leahy Scale: Fair Standing balance comment: Pt was able to let go of walker at times while at sink brushing her teeth had no LOB.  Would not feel comfortable with pt doing this while walking or for an extended amount of time.                             Pertinent Vitals/Pain Pain Assessment: Faces Pain Score: 2  Faces Pain Scale: Hurts a little bit Pain Location: R hip Pain Descriptors / Indicators: Aching Pain Intervention(s): Limited activity within patient's tolerance;Repositioned    Home Living Family/patient expects to be discharged to:: Private residence Living Arrangements: Alone Available Help at Discharge: Friend(s);Available 24 hours/day;Other (Comment) (for a few days) Type of Home: House Home Access: Stairs to enter Entrance Stairs-Rails: None Entrance Stairs-Number of Steps: 1 Home Layout: One level Home Equipment: Cane - single point;Shower seat;Adaptive equipment;Other (comment)      Prior Function Level of Independence: Independent         Comments: completel independent including driving and peforming all adls and iadls     Hand Dominance   Dominant Hand: Right    Extremity/Trunk Assessment   Upper Extremity Assessment: Overall WFL for tasks assessed  Lower Extremity Assessment: Defer to PT evaluation RLE Deficits / Details: pt with normal post op pain and weakness at least 3/5 ankle, 3/5 knee and 2/5 hip flex per gross functional assessment.     Cervical / Trunk  Assessment: Normal  Communication   Communication: No difficulties  Cognition Arousal/Alertness: Awake/alert Behavior During Therapy: WFL for tasks assessed/performed Overall Cognitive Status: Within Functional Limits for tasks assessed                      General Comments General comments (skin integrity, edema, etc.): Pt lives alone but has ex husband assisting when needed.    Exercises Total Joint Exercises Ankle Circles/Pumps: AROM;20 reps;Both;Supine   Assessment/Plan    PT Assessment Patient needs continued PT services  PT Problem List Decreased strength;Decreased range of motion;Decreased activity tolerance;Decreased balance;Decreased mobility;Decreased knowledge of use of DME;Decreased safety awareness;Pain          PT Treatment Interventions DME instruction;Gait training;Stair training;Functional mobility training;Therapeutic activities;Therapeutic exercise;Balance training;Patient/family education;Modalities;Manual techniques    PT Goals (Current goals can be found in the Care Plan section)  Acute Rehab PT Goals Patient Stated Goal: to get back home to her cats PT Goal Formulation: With patient/family Time For Goal Achievement: 10/31/16 Potential to Achieve Goals: Good    Frequency 7X/week   Barriers to discharge        Co-evaluation               End of Session Equipment Utilized During Treatment: Gait belt Activity Tolerance: Patient limited by lethargy Patient left: in bed;with call bell/phone within reach;with family/visitor present;with SCD's reapplied           Time: 1027-1101 PT Time Calculation (min) (ACUTE ONLY): 34 min   Charges:   PT Evaluation $PT Eval Moderate Complexity: 1 Procedure PT Treatments $Gait Training: 8-22 mins $Therapeutic Activity: 8-22 mins   PT G Codes:        [name] 10/24/2016, 12:17 PM

## 2016-10-24 NOTE — Progress Notes (Signed)
   Assessment: 1 Day Post-Op  S/P Procedure(s) (LRB): TOTAL HIP ARTHROPLASTY ANTERIOR APPROACH (Right) by Dr. Ernesta Amble. Murphy on 10/23/16  Principal Problem:   Primary osteoarthritis of right hip Active Problems:   Borderline personality disorder   Migraines   Chronic back pain   Osteoarthritis of hips, bilateral  Feeling great. Pain controlled. Tolerating diet. Not yet ambulating. Needed in and out cath. Need to follow urinary output closely. Labs pending.  Plan: Up with therapy Ambulate and encourage by mouth fluids-follow urinary output closely. Foley if necessary per protocol.  Weight Bearing: Weight Bearing as Tolerated (WBAT) right leg Dressings: mepilex.  VTE prophylaxis: Xarelto, SCDs, ambulation Dispo: Home w/ HHPT pending PT evaluation and ability to urinate spontaneously.  Subjective: Feeling very well. Pain well controlled. Tolerating diet. Required in and out cath postop. Not urinating spontaneously yet. No CP, SOB.  Not yet OOB.  Sat upright to edge of bed-no lightheadedness or dizziness.  Objective:   VITALS:   Vitals:   10/23/16 1433 10/23/16 2131 10/24/16 0034 10/24/16 0634  BP: (!) 97/53 (!) 77/47 (!) 75/40 (!) 86/41  Pulse: 66 71 70 77  Resp: 15 16 16 18   Temp: 97.6 F (36.4 C) 98.2 F (36.8 C) 98.6 F (37 C) 98.8 F (37.1 C)  TempSrc:  Oral Oral Oral  SpO2: 98% 96% 100% 99%  Weight:      Height:       CBC Latest Ref Rng & Units 10/12/2016 07/19/2014 07/18/2014  WBC 4.0 - 10.5 K/uL 5.0 7.7 7.6  Hemoglobin 12.0 - 15.0 g/dL 13.0 11.0(L) 12.2  Hematocrit 36.0 - 46.0 % 40.4 31.1(L) 35.4(L)  Platelets 150 - 400 K/uL 151 187 171   BMP Latest Ref Rng & Units 10/12/2016 07/19/2014 07/18/2014  Glucose 65 - 99 mg/dL 97 141(H) -  BUN 6 - 20 mg/dL 14 10 -  Creatinine 0.44 - 1.00 mg/dL 1.10(H) 0.62 -  Sodium 135 - 145 mmol/L 141 146 -  Potassium 3.5 - 5.1 mmol/L 4.0 3.9 2.8(LL)  Chloride 101 - 111 mmol/L 112(H) 114(H) -  CO2 22 - 32 mmol/L 24 21 -    Calcium 8.9 - 10.3 mg/dL 9.5 9.0 -   Intake/Output      10/31 0701 - 11/01 0700 11/01 0701 - 11/02 0700   P.O. 720    I.V. (mL/kg) 1000 (21.2)    IV Piggyback 250    Total Intake(mL/kg) 1970 (41.7)    Urine (mL/kg/hr) 600    Blood 500    Total Output 1100     Net +870          Urine Occurrence 0 x       Physical Exam: General: NAD.  Supine in bed. Conversant/pleasant Resp: No increased wob Cardio: regular rate and rhythm ABD soft Neurologically intact MSK Neurovascularly intact Sensation intact distally Intact pulses distally Dorsiflexion/Plantar flexion intact Incision: dressing C/D/I   Charna Elizabeth Martensen III 10/24/2016, 7:12 AM

## 2016-10-24 NOTE — Care Management Note (Signed)
Case Management Note  Patient Details  Name: Arissa Ardell MRN: [id number] Date of Birth: 09-24-1956  Subjective/Objective:  60 yr old female s/p right total hip arthroplasty, anterior approach.   Action/Plan: Case manager spoke with patient concerning Day and DME needs. Patient was preoperatively setup with [facility], no changes. CM has ordered RW, patient states she doesn't need 3in1. Will have family support at discharge.     Expected Discharge Date:   10/24/16               Expected Discharge Plan:  [place]  In-House Referral:  NA  Discharge planning Services  CM Consult  Post Acute Care Choice:  Durable Medical Equipment, Home Health Choice offered to:  Patient  DME Arranged:  Walker rolling DME Agency:  [place]:  PT [place]:  [facility] (now [facility])  Status of Service:  Completed, signed off  If discussed at [employer] of Stay Meetings, dates discussed:    Additional Comments:  Ninfa Meeker, RN 10/24/2016, 3:19 PM

## 2016-10-24 NOTE — Evaluation (Signed)
Occupational Therapy Evaluation Patient Details Name: [name] MRN: [id number] DOB: 07-16-1956 Today's Date: 10/24/2016    History of Present Illness 60 yo female admitted on 10/23/16 for right THA anterior approach. PMH significant fo chronic back pain, anxiety and depression, suicidal attempt 2012.    Clinical Impression   Pt admitted with the above diagnosis and has the deficits outline below. Pt would benefit from further OT just to assess shower transfers to make sure pt will be safe with this task at home. Pt lives alone and will have some help form her ex husband after d/c but not for long.  Feel practicing shower transfers is important for her safety in the bathroom.      Follow Up Recommendations  Supervision - Intermittent;No OT follow up    Equipment Recommendations  None recommended by OT    Recommendations for Other Services       Precautions / Restrictions Precautions Precautions: None Precaution Comments: Anterior Hip, No precautions Restrictions Weight Bearing Restrictions: Yes RLE Weight Bearing: Weight bearing as tolerated      Mobility Bed Mobility Overal bed mobility: Needs Assistance Bed Mobility: Supine to Sit;Sit to Supine     Supine to sit: Supervision Sit to supine: Supervision   General bed mobility comments: Pt able to get in and out bed without rails and bed flat.  Transfers Overall transfer level: Needs assistance Equipment used: Rolling walker (2 wheeled) Transfers: Sit to/from Stand Sit to Stand: Supervision         General transfer comment: VCs for hand placement    Balance Overall balance assessment: Needs assistance Sitting-balance support: Feet supported Sitting balance-Leahy Scale: Good Sitting balance - Comments: Supervision to sit at EOB    Standing balance support: Bilateral upper extremity supported;During functional activity Standing balance-Leahy Scale: Fair Standing balance comment: Pt was able to let  go of walker at times while at sink brushing her teeth had no LOB.  Would not feel comfortable with pt doing this while walking or for an extended amount of time.                            ADL Overall ADL's : Needs assistance/impaired Eating/Feeding: Independent;Sitting   Grooming: Wash/dry hands;Wash/dry face;Oral care;Supervision/safety;Standing   Upper Body Bathing: Set up;Sitting   Lower Body Bathing: Supervison/ safety;Sit to/from stand   Upper Body Dressing : Set up;Sitting   Lower Body Dressing: Supervision/safety;Sit to/from stand;With adaptive equipment   Toilet Transfer: Nurse, learning disability Details (indicate cue type and reason): Pt walked to bathroom Toileting- Clothing Manipulation and Hygiene: Supervision/safety;Sit to/from stand       Functional mobility during ADLs: Supervision/safety;Rolling walker General ADL Comments: Pt did well with adls and can donn socks with sock aid w/o assist.  Pt is hyperverbal and at times loses her attn span and can become fast and impulsive when walking.     Vision Vision Assessment?: No apparent visual deficits   Perception     Praxis      Pertinent Vitals/Pain Pain Assessment: Faces Pain Score: 2  Faces Pain Scale: Hurts a little bit Pain Location: R hip Pain Descriptors / Indicators: Aching Pain Intervention(s): Limited activity within patient's tolerance;Repositioned     Hand Dominance Right   Extremity/Trunk Assessment Upper Extremity Assessment Upper Extremity Assessment: Overall WFL for tasks assessed   Lower Extremity Assessment Lower Extremity Assessment: Defer to PT evaluation RLE Deficits / Details: pt with normal  post op pain and weakness at least 3/5 ankle, 3/5 knee and 2/5 hip flex per gross functional assessment.    Cervical / Trunk Assessment Cervical / Trunk Assessment: Normal   Communication Communication Communication: No difficulties    Cognition Arousal/Alertness: Awake/alert Behavior During Therapy: WFL for tasks assessed/performed Overall Cognitive Status: Within Functional Limits for tasks assessed                     General Comments       Exercises Exercises: Total Joint     Shoulder Instructions      Home Living Family/patient expects to be discharged to:: Private residence Living Arrangements: Alone Available Help at Discharge: Friend(s);Available 24 hours/day;Other (Comment) (for a few days) Type of Home: House Home Access: Stairs to enter [employer] of Steps: 1 Entrance Stairs-Rails: None Home Layout: One level     Bathroom Shower/Tub: Occupational psychologist: Handicapped height     Home Equipment: Climax - single point;Shower seat;Adaptive equipment;Other (comment) Adaptive Equipment: Reacher        Prior Functioning/Environment Level of Independence: Independent        Comments: completel independent including driving and peforming all adls and iadls        OT Problem List: Impaired balance (sitting and/or standing);Decreased knowledge of use of DME or AE   OT Treatment/Interventions: Self-care/ADL training;DME and/or AE instruction;Therapeutic activities    OT Goals(Current goals can be found in the care plan section) Acute Rehab OT Goals Patient Stated Goal: to get back home to her cats OT Goal Formulation: With patient Time For Goal Achievement: 10/31/16 Potential to Achieve Goals: Good ADL Goals Pt Will Perform Tub/Shower Transfer: Shower transfer;ambulating;shower seat;3 in 1;rolling walker;with supervision  OT Frequency: Min 2X/week   Barriers to D/C:            Co-evaluation              End of Session Equipment Utilized During Treatment: Surveyor, mining Communication: Mobility status  Activity Tolerance: Patient tolerated treatment well Patient left: in bed;with call bell/phone within reach   Time: 1129-1157 OT Time  Calculation (min): 28 min Charges:  OT General Charges $OT Visit: 1 Procedure OT Evaluation $OT Eval Moderate Complexity: 1 Procedure OT Treatments $Self Care/Home Management : 8-22 mins G-Codes:    Glenford Peers 10-28-16, 12:10 PM  [phone]

## 2016-10-24 NOTE — Progress Notes (Signed)
Physical Therapy Treatment Patient Details Name: [name] MRN: [id number] DOB: 13-Dec-1956 Today's Date: 10/24/2016    History of Present Illness 60 yo female admitted on 10/23/16 for right THA anterior approach. PMH significant fo chronic back pain, anxiety and depression, suicidal attempt 2012.     PT Comments    Pt presents with improved Wbing through RLE and improved tolerance for gait activities. Instructed on safe stair negotiation and use of proper sequencing. Pt will need further instruction for safety. BP was orthostatic throughout session which is patients norm per pmh.   Follow Up Recommendations  Home health PT     Equipment Recommendations  Rolling walker with 5" wheels;3in1 (PT)    Recommendations for Other Services       Precautions / Restrictions Precautions Precautions: None Precaution Comments: Anterior Hip, No precautions Restrictions Weight Bearing Restrictions: Yes RLE Weight Bearing: Weight bearing as tolerated    Mobility  Bed Mobility Overal bed mobility: Needs Assistance Bed Mobility: Supine to Sit;Sit to Supine     Supine to sit: Supervision Sit to supine: Supervision   General bed mobility comments: Pt able to get in and out bed without rails and bed flat.  Transfers Overall transfer level: Needs assistance Equipment used: Rolling walker (2 wheeled) Transfers: Sit to/from Stand Sit to Stand: Supervision         General transfer comment: supervision for safety from EOB  Ambulation/Gait Ambulation/Gait assistance: Min assist Ambulation Distance (Feet): 150 Feet Assistive device: Rolling walker (2 wheeled) Gait Pattern/deviations: Step-through pattern;Decreased stance time - right Gait velocity: decreased Gait velocity interpretation: <1.8 ft/sec, indicative of risk for recurrent falls General Gait Details: moderate antalgic gait noted initially and improved as distance increased. requires assistance for maneuvering iv pole  and for safety awareness   Stairs Stairs: Yes Stairs assistance: Min guard Stair Management: Two rails;Step to pattern;Forwards Number of Stairs: 3 General stair comments: requires min gaurd for safety  Wheelchair Mobility    Modified Rankin (Stroke Patients Only)       Balance Overall balance assessment: Needs assistance Sitting-balance support: No upper extremity supported Sitting balance-Leahy Scale: Good Sitting balance - Comments: Supervision to sit at EOB    Standing balance support: Bilateral upper extremity supported Standing balance-Leahy Scale: Fair Standing balance comment: Requires Supervision for safety with static balance                     Cognition Arousal/Alertness: Awake/alert Behavior During Therapy: WFL for tasks assessed/performed Overall Cognitive Status: Within Functional Limits for tasks assessed                      Exercises Total Joint Exercises Ankle Circles/Pumps: AROM;20 reps;Seated Quad Sets: AROM;Right;10 reps;Supine Heel Slides: AROM;10 reps;Supine    General Comments General comments (skin integrity, edema, etc.): Pt lives alone but has ex husband assisting when needed.      Pertinent Vitals/Pain Pain Assessment: 0-10 Pain Score: 4  Faces Pain Scale: Hurts a little bit Pain Location: Right Hip Pain Descriptors / Indicators: Aching;Burning Pain Intervention(s): Limited activity within patient's tolerance;Monitored during session;Premedicated before session    Home Living Family/patient expects to be discharged to:: Private residence Living Arrangements: Alone Available Help at Discharge: Friend(s);Available 24 hours/day;Other (Comment) (for a few days) Type of Home: House Home Access: Stairs to enter Entrance Stairs-Rails: None Home Layout: One level Home Equipment: Cane - single point;Shower seat;Adaptive equipment;Other (comment)      Prior Function Level of Independence:  Independent      Comments:  completel independent including driving and peforming all adls and iadls   PT Goals (current goals can now be found in the care plan section) Acute Rehab PT Goals Patient Stated Goal: to get back home to her cats PT Goal Formulation: With patient/family Time For Goal Achievement: 10/31/16 Potential to Achieve Goals: Good Progress towards PT goals: Progressing toward goals    Frequency    7X/week      PT Plan Current plan remains appropriate    Co-evaluation             End of Session Equipment Utilized During Treatment: Gait belt Activity Tolerance: Patient tolerated treatment well;Patient limited by fatigue Patient left: in chair;with call bell/phone within reach     Time: 1323-1355 PT Time Calculation (min) (ACUTE ONLY): 32 min  Charges:  $Gait Training: 8-22 mins $Therapeutic Exercise: 8-22 mins $Therapeutic Activity: 8-22 mins                    G Codes:      Marcanthony Sleight Sloan Leiter November 14, 2016, 2:16 PM

## 2016-10-24 NOTE — Consult Note (Signed)
    [facility] CM Primary Care Navigator  10/24/2016  [name] December 16, 1956 [id number]   Patient seen at the bedside with ex-husband [name]) to identify discharge needs.  Patient shared that increased pain to right hip limiting activity, mobility and balance had led to this admission/ surgery. Plan for discharge is home with Home Health services (PT). Patient expressed her eagerness to be home.  Patient confirms that primary provider is Dr. Harlan Stains with [facility].  Patient verbalized being able to drive prior to admission/ surgery. Transportation to her doctors' appointments after discharge will be  provided by ex-husband or sister [name]) as stated.  [employer] transportation benefits also discussed with patient.   Patient states using [facility] service and [facility] [place] to obtain medications without difficulty. Patient mentioned being independent with medication management at home straight out of the bottle containers.  Sister and ex-husband will be the primary caregivers at home as patient stated.  Patient and ex-husband expressed understanding to call primary care provider's office once discharged, for a post discharge follow-up appointment within a week or sooner if needed.  Patient letter provided as a reminder.  Patient denies any needs or concerns at this time. 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  For questions, please contact:  [name], BSN, RN- [facility] Primary Care Navigator  Telephone: [phone] [place]

## 2016-10-24 NOTE — Discharge Instructions (Signed)
Information on my medicine - XARELTO (Rivaroxaban)  This medication education was reviewed with me or my healthcare representative as part of my discharge preparation.  The pharmacist that spoke with me during my hospital stay was:  Eudelia Bunch, [facility]  Why was Xarelto prescribed for you? Xarelto was prescribed for you to reduce the risk of a blood clot forming that can cause a stroke if you have a medical condition called atrial fibrillation (a type of irregular heartbeat).  What do you need to know about xarelto ? Take your Xarelto ONCE DAILY at the same time every day with your evening meal. If you have difficulty swallowing the tablet whole, you may crush it and mix in applesauce just prior to taking your dose.  Take Xarelto exactly as prescribed by your doctor and DO NOT stop taking Xarelto without talking to the doctor who prescribed the medication.  Stopping without other stroke prevention medication to take the place of Xarelto may increase your risk of developing a clot that causes a stroke.  Refill your prescription before you run out.  After discharge, you should have regular check-up appointments with your healthcare provider that is prescribing your Xarelto.  In the future your dose may need to be changed if your kidney function or weight changes by a significant amount.  What do you do if you miss a dose? If you are taking Xarelto ONCE DAILY and you miss a dose, take it as soon as you remember on the same day then continue your regularly scheduled once daily regimen the next day. Do not take two doses of Xarelto at the same time or on the same day.   Important Safety Information A possible side effect of Xarelto is bleeding. You should call your healthcare provider right away if you experience any of the following: ? Bleeding from an injury or your nose that does not stop. ? Unusual colored urine (red or dark brown) or unusual colored stools (red or black). ? Unusual  bruising for unknown reasons. ? A serious fall or if you hit your head (even if there is no bleeding).  Some medicines may interact with Xarelto and might increase your risk of bleeding while on Xarelto. To help avoid this, consult your healthcare provider or pharmacist prior to using any new prescription or non-prescription medications, including herbals, vitamins, non-steroidal anti-inflammatory drugs (NSAIDs) and supplements.  This website has more information on Xarelto: [email or web address] INSTRUCTIONS AFTER JOINT REPLACEMENT   o Remove items at home which could result in a fall. This includes throw rugs or furniture in walking pathways o ICE to the affected joint every three hours while awake for 30 minutes at a time, for at least the first 3-5 days, and then as needed for pain and swelling.  Continue to use ice for pain and swelling. You may notice swelling that will progress down to the foot and ankle.  This is normal after surgery.  Elevate your leg when you are not up walking on it.   o Continue to use the breathing machine you got in the hospital (incentive spirometer) which will help keep your temperature down.  It is common for your temperature to cycle up and down following surgery, especially at night when you are not up moving around and exerting yourself.  The breathing machine keeps your lungs expanded and your temperature down.   DIET:  As you were doing prior to hospitalization, we recommend a well-balanced diet.  DRESSING / WOUND CARE /  SHOWERING  Keep the surgical dressing until follow up. IF THE DRESSING FALLS OFF or the wound gets wet inside, change the dressing with sterile gauze.  Please use good hand washing techniques before changing the dressing.  Do not use any lotions or creams on the incision until instructed by your surgeon.    ACTIVITY  o Increase activity slowly as tolerated, but follow the weight bearing instructions below.   o No driving for 6 weeks or until  further direction given by your physician.  You cannot drive while taking narcotics.  o No lifting or carrying greater than 10 lbs. until further directed by your surgeon. o Avoid periods of inactivity such as sitting longer than an hour when not asleep. This helps prevent blood clots.  o You may return to work once you are authorized by your doctor.     WEIGHT BEARING   Weight bearing as tolerated with assist device (walker, cane, etc) as directed, use it as long as suggested by your surgeon or therapist, typically at least 4-6 weeks.   EXERCISES  Results after joint replacement surgery are often greatly improved when you follow the exercise, range of motion and muscle strengthening exercises prescribed by your doctor. Safety measures are also important to protect the joint from further injury. Any time any of these exercises cause you to have increased pain or swelling, decrease what you are doing until you are comfortable again and then slowly increase them. If you have problems or questions, call your caregiver or physical therapist for advice.   Rehabilitation is important following a joint replacement. After just a few days of immobilization, the muscles of the leg can become weakened and shrink (atrophy).  These exercises are designed to build up the tone and strength of the thigh and leg muscles and to improve motion. Often times heat used for twenty to thirty minutes before working out will loosen up your tissues and help with improving the range of motion but do not use heat for the first two weeks following surgery (sometimes heat can increase post-operative swelling).   These exercises can be done on a training (exercise) mat, on the floor, on a table or on a bed. Use whatever works the best and is most comfortable for you.    Use music or television while you are exercising so that the exercises are a pleasant break in your day. This will make your life better with the exercises acting  as a break in your routine that you can look forward to.   Perform all exercises about fifteen times, three times per day or as directed.  You should exercise both the operative leg and the other leg as well.  Exercises include:    Quad Sets - Tighten up the muscle on the front of the thigh (Quad) and hold for 5-10 seconds.    Straight Leg Raises - With your knee straight (if you were given a brace, keep it on), lift the leg to 60 degrees, hold for 3 seconds, and slowly lower the leg.  Perform this exercise against resistance later as your leg gets stronger.   Leg Slides: Lying on your back, slowly slide your foot toward your buttocks, bending your knee up off the floor (only go as far as is comfortable). Then slowly slide your foot back down until your leg is flat on the floor again.   Angel Wings: Lying on your back spread your legs to the side as far apart as you  can without causing discomfort.   Hamstring Strength:  Lying on your back, push your heel against the floor with your leg straight by tightening up the muscles of your buttocks.  Repeat, but this time bend your knee to a comfortable angle, and push your heel against the floor.  You may put a pillow under the heel to make it more comfortable if necessary.   A rehabilitation program following joint replacement surgery can speed recovery and prevent re-injury in the future due to weakened muscles. Contact your doctor or a physical therapist for more information on knee rehabilitation.    CONSTIPATION  Constipation is defined medically as fewer than three stools per week and severe constipation as less than one stool per week.  Even if you have a regular bowel pattern at home, your normal regimen is likely to be disrupted due to multiple reasons following surgery.  Combination of anesthesia, postoperative narcotics, change in appetite and fluid intake all can affect your bowels.   YOU MUST use at least one of the following options; they  are listed in order of increasing strength to get the job done.  They are all available over the counter, and you may need to use some, POSSIBLY even all of these options:    Drink plenty of fluids (prune juice may be helpful) and high fiber foods Colace 100 mg by mouth twice a day  Senokot for constipation as directed and as needed Dulcolax (bisacodyl), take with full glass of water  Miralax (polyethylene glycol) once or twice a day as needed.  If you have tried all these things and are unable to have a bowel movement in the first 3-4 days after surgery call either your surgeon or your primary doctor.    If you experience loose stools or diarrhea, hold the medications until you stool forms back up.  If your symptoms do not get better within 1 week or if they get worse, check with your doctor.  If you experience "the worst abdominal pain ever" or develop nausea or vomiting, please contact the office immediately for further recommendations for treatment.   ITCHING:  If you experience itching with your medications, try taking only a single pain pill, or even half a pain pill at a time.  You can also use Benadryl over the counter for itching or also to help with sleep.   TED HOSE STOCKINGS:  Use stockings on both legs until for at least 2 weeks or as directed by physician office. They may be removed at night for sleeping.  MEDICATIONS:  See your medication summary on the After Visit Summary that nursing will review with you.  You may have some home medications which will be placed on hold until you complete the course of blood thinner medication.  It is important for you to complete the blood thinner medication as prescribed.  PRECAUTIONS:  If you experience chest pain or shortness of breath - call 911 immediately for transfer to the hospital emergency department.   If you develop a fever greater that 101 F, purulent drainage from wound, increased redness or drainage from wound, foul odor from the  wound/dressing, or calf pain - CONTACT YOUR SURGEON.                                                   FOLLOW-UP APPOINTMENTS:  If you do not already have a post-op appointment, please call the office for an appointment to be seen by your surgeon.  Guidelines for how soon to be seen are listed in your After Visit Summary, but are typically between 1-4 weeks after surgery.  OTHER INSTRUCTIONS:   Knee Replacement:  Do not place pillow under knee, focus on keeping the knee straight while resting. CPM instructions: 0-90 degrees, 2 hours in the morning, 2 hours in the afternoon, and 2 hours in the evening. Place foam block, curve side up under heel at all times except when in CPM or when walking.  DO NOT modify, tear, cut, or change the foam block in any way.  MAKE SURE YOU:   Understand these instructions.   Get help right away if you are not doing well or get worse.    Thank you for letting us be a part of your medical care team.  It is a privilege we respect greatly.  We hope these instructions will help you stay on track for a fast and full recovery!

## 2016-10-25 ENCOUNTER — Encounter (HOSPITAL_COMMUNITY): Payer: Self-pay | Admitting: General Practice

## 2016-10-25 DIAGNOSIS — D62 Acute posthemorrhagic anemia: Secondary | ICD-10-CM | POA: Diagnosis not present

## 2016-10-25 LAB — CBC
HCT: 22.4 % — ABNORMAL LOW (ref 36.0–46.0)
HEMOGLOBIN: 7.3 g/dL — AB (ref 12.0–15.0)
MCH: 32.6 pg (ref 26.0–34.0)
MCHC: 32.6 g/dL (ref 30.0–36.0)
MCV: 100 fL (ref 78.0–100.0)
PLATELETS: 82 10*3/uL — AB (ref 150–400)
RBC: 2.24 MIL/uL — AB (ref 3.87–5.11)
RDW: 13.1 % (ref 11.5–15.5)
WBC: 9.2 10*3/uL (ref 4.0–10.5)

## 2016-10-25 LAB — PREPARE RBC (CROSSMATCH)

## 2016-10-25 MED ORDER — SODIUM CHLORIDE 0.9 % IV SOLN: Freq: Once | INTRAVENOUS | Status: DC

## 2016-10-25 NOTE — Progress Notes (Signed)
Physical Therapy Treatment Patient Details Name: [name] MRN: [id number] DOB: [birth year] Today's Date: 10/25/2016    History of Present Illness 60 yo female admitted on 10/23/16 for right THA anterior approach. PMH significant fo chronic back pain, anxiety and depression, suicidal attempt 2012.     PT Comments    Pt presents in bed with increased c/o fatigue initially. Performed supine strengthening exercises and instructed pt to continue throughout the day. Patient to receive blood as hemoglobin and platelets are low. Assured patient that these lower values will result in feeling more fatigue. Patient increased gait distance this visit and improved safety with gait noted with Rw.   Follow Up Recommendations  Home health PT     Equipment Recommendations  Rolling walker with 5" wheels;3in1 (PT)    Recommendations for Other Services       Precautions / Restrictions Precautions Precautions: None Precaution Comments: Anterior Hip, No precautions Restrictions Weight Bearing Restrictions: Yes RLE Weight Bearing: Weight bearing as tolerated    Mobility  Bed Mobility Overal bed mobility: Needs Assistance Bed Mobility: Supine to Sit;Sit to Supine     Supine to sit: Min guard Sit to supine: Min guard   General bed mobility comments: min gaurd for safety and to maneuver RLE into and out of bed  Transfers Overall transfer level: Needs assistance Equipment used: Rolling walker (2 wheeled) Transfers: Sit to/from Stand Sit to Stand: Min guard         General transfer comment: min guard for safety this visit  Ambulation/Gait Ambulation/Gait assistance: Min guard Ambulation Distance (Feet): 150 Feet Assistive device: Rolling walker (2 wheeled) Gait Pattern/deviations: Step-through pattern;Decreased stance time - right;Antalgic Gait velocity: decreased Gait velocity interpretation: <1.8 ft/sec, indicative of risk for recurrent falls General Gait Details: mildly  antalgic gait this visit. slight increase as gait distance increases   Stairs            Wheelchair Mobility    Modified Rankin (Stroke Patients Only)       Balance Overall balance assessment: Needs assistance Sitting-balance support: No upper extremity supported Sitting balance-Leahy Scale: Good     Standing balance support: Bilateral upper extremity supported Standing balance-Leahy Scale: Fair Standing balance comment: requires supervision for safety                    Cognition Arousal/Alertness: Awake/alert Behavior During Therapy: WFL for tasks assessed/performed Overall Cognitive Status: Within Functional Limits for tasks assessed                      Exercises Total Joint Exercises Ankle Circles/Pumps: AROM;20 reps;Supine;Both Quad Sets: AROM;10 reps;Supine Short Arc Quad: AROM;Right;10 reps;Supine Heel Slides: AROM;10 reps;Supine;Right Hip ABduction/ADduction: AROM;10 reps;Right;Supine    General Comments        Pertinent Vitals/Pain Pain Assessment: 0-10 Pain Score: 6  Pain Location: Right Hip Pain Descriptors / Indicators: Aching;Sore Pain Intervention(s): Monitored during session;Premedicated before session    Home Living                      Prior Function            PT Goals (current goals can now be found in the care plan section) Acute Rehab PT Goals Patient Stated Goal: to get back home to her cats Progress towards PT goals: Progressing toward goals    Frequency    7X/week      PT Plan Current plan remains appropriate  Co-evaluation             End of Session Equipment Utilized During Treatment: Gait belt Activity Tolerance: Patient tolerated treatment well Patient left: in bed;with call bell/phone within reach;with bed alarm set;with SCD's reapplied     Time: 0917-0959 PT Time Calculation (min) (ACUTE ONLY): 42 min  Charges:  $Gait Training: 23-37 mins $Therapeutic Exercise: 8-22  mins                    G Codes:      Scheryl Marten PT, DPT  [phone]  10/25/2016, 10:07 AM

## 2016-10-25 NOTE — Progress Notes (Signed)
2 Units of blood completed with out complications, case mgmt called regarding pt walker for home,Discharge papers and medications reviewed

## 2016-10-25 NOTE — Progress Notes (Signed)
Physical Therapy Treatment Patient Details Name: [name] MRN: [id number] DOB: 29-Jul-1956 Today's Date: 10/25/2016    History of Present Illness 60 yo female admitted on 10/23/16 for right THA anterior approach. PMH significant fo chronic back pain, anxiety and depression, suicidal attempt 2012.     PT Comments    Pt continues to c/o soreness following exercises and with initial weightbearing. Pain is increased during gait this visit and requests medication from RN who brought in medication at the completion of this treatment. Pt receiving blood transfusion during this session and is expected to DC this afternoon pending lab results.   Follow Up Recommendations  Home health PT     Equipment Recommendations  Rolling walker with 5" wheels;3in1 (PT)    Recommendations for Other Services       Precautions / Restrictions Precautions Precautions: None Precaution Comments: Anterior Hip, No precautions Restrictions Weight Bearing Restrictions: Yes RLE Weight Bearing: Weight bearing as tolerated    Mobility  Bed Mobility Overal bed mobility: Needs Assistance Bed Mobility: Supine to Sit;Sit to Supine     Supine to sit: Min guard Sit to supine: Min guard   General bed mobility comments: min gaurd for safety and to maneuver RLE into and out of bed  Transfers Overall transfer level: Needs assistance Equipment used: Rolling walker (2 wheeled) Transfers: Sit to/from Stand Sit to Stand: Min guard         General transfer comment: min guard for safety this visit  Ambulation/Gait Ambulation/Gait assistance: Min guard Ambulation Distance (Feet): 150 Feet Assistive device: Rolling walker (2 wheeled) Gait Pattern/deviations: Step-through pattern;Decreased stance time - right;Antalgic Gait velocity: decreased Gait velocity interpretation: <1.8 ft/sec, indicative of risk for recurrent falls General Gait Details: mildly antalgic gait this visit. slight increase as gait  distance increases   Stairs            Wheelchair Mobility    Modified Rankin (Stroke Patients Only)       Balance Overall balance assessment: Needs assistance Sitting-balance support: No upper extremity supported Sitting balance-Leahy Scale: Good     Standing balance support: Bilateral upper extremity supported Standing balance-Leahy Scale: Fair Standing balance comment: Require supervision for safety with standing                    Cognition Arousal/Alertness: Awake/alert Behavior During Therapy: WFL for tasks assessed/performed Overall Cognitive Status: Within Functional Limits for tasks assessed                      Exercises Total Joint Exercises Ankle Circles/Pumps: AROM;20 reps;Supine;Both Quad Sets: AROM;10 reps;Supine Long Arc Quad: AROM;10 reps;Right;Seated    General Comments        Pertinent Vitals/Pain Pain Assessment: 0-10 Pain Score: 5  Pain Location: Right hip Pain Descriptors / Indicators: Aching;Sore Pain Intervention(s): Monitored during session;Patient requesting pain meds-RN notified;Ice applied    Home Living Family/patient expects to be discharged to:: Private residence Living Arrangements: Alone                  Prior Function            PT Goals (current goals can now be found in the care plan section) Acute Rehab PT Goals Patient Stated Goal: to get back home to her cats Progress towards PT goals: Progressing toward goals    Frequency    7X/week      PT Plan Current plan remains appropriate    Co-evaluation  End of Session Equipment Utilized During Treatment: Gait belt Activity Tolerance: Patient tolerated treatment well Patient left: in bed;with call bell/phone within reach     Time: 1336-1408 PT Time Calculation (min) (ACUTE ONLY): 32 min  Charges:  $Gait Training: 8-22 mins $Therapeutic Exercise: 8-22 mins                    G Codes:      Scheryl Marten PT,  DPT  [phone]  10/25/2016, 3:04 PM

## 2016-10-25 NOTE — Discharge Summary (Signed)
Discharge Summary  Patient ID: [name] MRN: [id number] DOB/AGE: [birth year] 60 y.o.  Admit date: 10/23/2016 Discharge date: 10/25/2016  Admission Diagnoses:  Primary osteoarthritis of right hip  Discharge Diagnoses:  Principal Problem:   Primary osteoarthritis of right hip Active Problems:   Borderline personality disorder   Migraines   Chronic back pain   Osteoarthritis of hips, bilateral   Acute blood loss anemia   Past Medical History:  Diagnosis Date  . Anxiety   . Arthritis   . Carpal tunnel syndrome of left wrist   . Complication of anesthesia    states she had to much and once adn ended up on o2, no problems since  . Cubital tunnel syndrome on left   . Depression   . Dyspnea   . GERD (gastroesophageal reflux disease)   . History of kidney stones   . History of suicide attempt 2012--  XANAX OVERDOSE  . Hypotension    reports that her SBP can run < 90  . Migraine   . Personality disorder     Surgeries: Procedure(s): TOTAL HIP ARTHROPLASTY ANTERIOR APPROACH on 10/23/2016   Consultants (if any):   Discharged Condition: Improved  Progress  Subjective: Feeling Well. Hoping to go home today..  OOB.  Pain controlled with PO meds.  Tolerating diet.  Urinating.  No CP, SOB.  Objective: General: NAD. Supine in bed  Resp: No increased WOB Cardio: regular rate and rhythm ABD soft Neurologically intact MSK Neurovascularly intact Sensation intact distally Intact pulses distally Dorsiflexion/Plantar flexion intact Incision: dressing C/D/I  Plan: Given intermittent lightheadedness and hemoglobin less than 8, we'll plan to administer 2 units of PRBCs today. Discussed with patient. She verbalizes understanding and agrees with plan.  Weight Bearing: Weight Bearing as Tolerated (WBAT) right leg Dressings: Mepilex.  VTE prophylaxis: Xarelto, ambulation, SCDs Dispo: Home  Hospital Course: [name] is an 60 y.o. female who was admitted 10/23/2016  with a diagnosis of Primary osteoarthritis of right hip and went to the operating room on 10/23/2016 and underwent the above named procedures.    She was given perioperative antibiotics:  Anti-infectives    Start     Dose/Rate Route Frequency Ordered Stop   10/23/16 2200  valACYclovir (VALTREX) tablet 500 mg     500 mg Oral Daily at bedtime 10/23/16 1418     10/23/16 1600  ceFAZolin (ANCEF) IVPB 1 g/50 mL premix     1 g 100 mL/hr over 30 Minutes Intravenous Every 6 hours 10/23/16 1418 10/23/16 2333   10/23/16 0622  ceFAZolin (ANCEF) IVPB 2g/100 mL premix     2 g 200 mL/hr over 30 Minutes Intravenous On call to O.R. 10/23/16 0622 10/23/16 1044    .  She was given sequential compression devices, early ambulation, and Xarelto for DVT prophylaxis.  She benefited maximally from the hospital stay and there were no complications.    Recent vital signs:  Vitals:   10/24/16 2007 10/25/16 0500  BP: (!) 90/58 100/60  Pulse: 70 72  Resp: 16 16  Temp: 98.7 F (37.1 C) 97.9 F (36.6 C)    Recent laboratory studies:  Lab Results  Component Value Date   HGB 7.6 (L) 10/24/2016   HGB 13.0 10/12/2016   HGB 11.0 (L) 07/19/2014   Lab Results  Component Value Date   WBC 6.0 10/24/2016   PLT 89 (L) 10/24/2016   Lab Results  Component Value Date   INR 0.99 10/12/2016   Lab Results  Component Value Date   NA 141 10/24/2016   K 3.8 10/24/2016   CL 115 (H) 10/24/2016   CO2 23 10/24/2016   BUN 13 10/24/2016   CREATININE 1.01 (H) 10/24/2016   GLUCOSE 96 10/24/2016    Discharge Medications:     Medication List    TAKE these medications   clonazePAM 1 MG tablet Commonly known as:  KLONOPIN Take 0.5-2 mg by mouth 4 (four) times daily as needed for anxiety. 2 mg at bedtime (scheduled)   docusate sodium 100 MG capsule Commonly known as:  COLACE Take 1 capsule (100 mg total) by mouth 2 (two) times daily. To prevent constipation while taking pain medication.   gabapentin 300 MG  capsule Commonly known as:  NEURONTIN Take 300 mg by mouth 3 (three) times daily. Typically takes 900 mg at one time   methocarbamol 500 MG tablet Commonly known as:  ROBAXIN Take 1 tablet (500 mg total) by mouth every 6 (six) hours as needed for muscle spasms.   nefazodone 200 MG tablet Commonly known as:  SERZONE Take 400 mg by mouth daily.   omeprazole 20 MG capsule Commonly known as:  PRILOSEC Take 20 mg by mouth daily.   ondansetron 4 MG tablet Commonly known as:  ZOFRAN Take 1 tablet (4 mg total) by mouth every 8 (eight) hours as needed for nausea or vomiting.   risperiDONE 0.5 MG tablet Commonly known as:  RISPERDAL Take 1 tablet (0.5 mg total) by mouth 2 (two) times daily.   rivaroxaban 10 MG Tabs tablet Commonly known as:  XARELTO Take 1 tablet (10 mg total) by mouth daily.   topiramate 50 MG tablet Commonly known as:  TOPAMAX Take 250 mg by mouth daily.   traMADol 50 MG tablet Commonly known as:  ULTRAM Take 1-2 tablets (50-100 mg total) by mouth every 6 (six) hours as needed.   traZODone 50 MG tablet Commonly known as:  DESYREL Take 50 mg by mouth at bedtime.   valACYclovir 500 MG tablet Commonly known as:  VALTREX Take 500 mg by mouth at bedtime.       Diagnostic Studies: Dg C-arm 1-60 Min  Result Date: 10/23/2016 CLINICAL DATA:  Right total hip arthroplasty, anterior approach. EXAM: OPERATIVE RIGHT HIP WITH PELVIS; DG C-ARM 61-120 MIN COMPARISON:  None. FLUOROSCOPY TIME:  C-arm fluoroscopic images were obtained intraoperatively and submitted for post operative interpretation. Please see the performing provider's procedural report for the fluoroscopy time utilized. FINDINGS: Two intraoperative spot fluoroscopic images centered over the pubic symphysis and right hip are provided. A right total hip arthroplasty has been performed. The prosthetic components appear normally located on these frontal projections. IMPRESSION: Intraoperative images during right  total hip arthroplasty. Electronically Signed   By: Logan Bores M.D.   On: 10/23/2016 12:20   Dg Hip Operative Unilat W Or W/o Pelvis Right  Result Date: 10/23/2016 CLINICAL DATA:  Right total hip arthroplasty, anterior approach. EXAM: OPERATIVE RIGHT HIP WITH PELVIS; DG C-ARM 61-120 MIN COMPARISON:  None. FLUOROSCOPY TIME:  C-arm fluoroscopic images were obtained intraoperatively and submitted for post operative interpretation. Please see the performing provider's procedural report for the fluoroscopy time utilized. FINDINGS: Two intraoperative spot fluoroscopic images centered over the pubic symphysis and right hip are provided. A right total hip arthroplasty has been performed. The prosthetic components appear normally located on these frontal projections. IMPRESSION: Intraoperative images during right total hip arthroplasty. Electronically Signed   By: Logan Bores M.D.   On: 10/23/2016 12:20  Disposition: Home with Home health   Follow-up Information    MURPHY, TIMOTHY D, MD Follow up in 2 week(s).   Specialty:  Orthopedic Surgery Contact information: [address] [place] [address] [phone]        [facility] .   Specialty:  [place] Why:  Someone from [facility] will contact you to arrange start date and time for therapy.  Contact information: [address] [place] [address] [phone]           Signed: Prudencio Burly III PA-C 10/25/2016, 6:52 AM

## 2016-10-25 NOTE — Progress Notes (Signed)
Called Dr Percell Miller office Ms Migdalia Dk can left without an H & H per Prudencio Burly III per verbal order

## 2016-10-26 DIAGNOSIS — F603 Borderline personality disorder: Secondary | ICD-10-CM | POA: Diagnosis not present

## 2016-10-26 DIAGNOSIS — G43909 Migraine, unspecified, not intractable, without status migrainosus: Secondary | ICD-10-CM | POA: Diagnosis not present

## 2016-10-26 DIAGNOSIS — G8929 Other chronic pain: Secondary | ICD-10-CM | POA: Diagnosis not present

## 2016-10-26 DIAGNOSIS — M1612 Unilateral primary osteoarthritis, left hip: Secondary | ICD-10-CM | POA: Diagnosis not present

## 2016-10-26 DIAGNOSIS — Z471 Aftercare following joint replacement surgery: Secondary | ICD-10-CM | POA: Diagnosis not present

## 2016-10-26 DIAGNOSIS — Z96641 Presence of right artificial hip joint: Secondary | ICD-10-CM | POA: Diagnosis not present

## 2016-10-26 LAB — TYPE AND SCREEN
ABO/RH(D): A POS
Antibody Screen: NEGATIVE
UNIT DIVISION: 0
UNIT DIVISION: 0

## 2016-10-29 DIAGNOSIS — G8929 Other chronic pain: Secondary | ICD-10-CM | POA: Diagnosis not present

## 2016-10-29 DIAGNOSIS — Z96641 Presence of right artificial hip joint: Secondary | ICD-10-CM | POA: Diagnosis not present

## 2016-10-29 DIAGNOSIS — Z471 Aftercare following joint replacement surgery: Secondary | ICD-10-CM | POA: Diagnosis not present

## 2016-10-29 DIAGNOSIS — M1612 Unilateral primary osteoarthritis, left hip: Secondary | ICD-10-CM | POA: Diagnosis not present

## 2016-10-29 DIAGNOSIS — F603 Borderline personality disorder: Secondary | ICD-10-CM | POA: Diagnosis not present

## 2016-10-29 DIAGNOSIS — G43909 Migraine, unspecified, not intractable, without status migrainosus: Secondary | ICD-10-CM | POA: Diagnosis not present

## 2016-10-31 DIAGNOSIS — F603 Borderline personality disorder: Secondary | ICD-10-CM | POA: Diagnosis not present

## 2016-10-31 DIAGNOSIS — G8929 Other chronic pain: Secondary | ICD-10-CM | POA: Diagnosis not present

## 2016-10-31 DIAGNOSIS — G43909 Migraine, unspecified, not intractable, without status migrainosus: Secondary | ICD-10-CM | POA: Diagnosis not present

## 2016-10-31 DIAGNOSIS — Z471 Aftercare following joint replacement surgery: Secondary | ICD-10-CM | POA: Diagnosis not present

## 2016-10-31 DIAGNOSIS — Z96641 Presence of right artificial hip joint: Secondary | ICD-10-CM | POA: Diagnosis not present

## 2016-10-31 DIAGNOSIS — M1612 Unilateral primary osteoarthritis, left hip: Secondary | ICD-10-CM | POA: Diagnosis not present

## 2016-11-02 DIAGNOSIS — Z471 Aftercare following joint replacement surgery: Secondary | ICD-10-CM | POA: Diagnosis not present

## 2016-11-02 DIAGNOSIS — M1612 Unilateral primary osteoarthritis, left hip: Secondary | ICD-10-CM | POA: Diagnosis not present

## 2016-11-02 DIAGNOSIS — G43909 Migraine, unspecified, not intractable, without status migrainosus: Secondary | ICD-10-CM | POA: Diagnosis not present

## 2016-11-02 DIAGNOSIS — F603 Borderline personality disorder: Secondary | ICD-10-CM | POA: Diagnosis not present

## 2016-11-02 DIAGNOSIS — G8929 Other chronic pain: Secondary | ICD-10-CM | POA: Diagnosis not present

## 2016-11-02 DIAGNOSIS — Z96641 Presence of right artificial hip joint: Secondary | ICD-10-CM | POA: Diagnosis not present

## 2016-11-05 ENCOUNTER — Ambulatory Visit: Payer: Self-pay

## 2016-11-05 DIAGNOSIS — M1612 Unilateral primary osteoarthritis, left hip: Secondary | ICD-10-CM | POA: Diagnosis not present

## 2016-11-05 DIAGNOSIS — F603 Borderline personality disorder: Secondary | ICD-10-CM | POA: Diagnosis not present

## 2016-11-05 DIAGNOSIS — Z96641 Presence of right artificial hip joint: Secondary | ICD-10-CM | POA: Diagnosis not present

## 2016-11-05 DIAGNOSIS — G43909 Migraine, unspecified, not intractable, without status migrainosus: Secondary | ICD-10-CM | POA: Diagnosis not present

## 2016-11-05 DIAGNOSIS — Z471 Aftercare following joint replacement surgery: Secondary | ICD-10-CM | POA: Diagnosis not present

## 2016-11-05 DIAGNOSIS — G8929 Other chronic pain: Secondary | ICD-10-CM | POA: Diagnosis not present

## 2016-11-07 DIAGNOSIS — M1612 Unilateral primary osteoarthritis, left hip: Secondary | ICD-10-CM | POA: Diagnosis not present

## 2016-11-07 DIAGNOSIS — Z471 Aftercare following joint replacement surgery: Secondary | ICD-10-CM | POA: Diagnosis not present

## 2016-11-07 DIAGNOSIS — M1611 Unilateral primary osteoarthritis, right hip: Secondary | ICD-10-CM | POA: Diagnosis not present

## 2016-11-07 DIAGNOSIS — F603 Borderline personality disorder: Secondary | ICD-10-CM | POA: Diagnosis not present

## 2016-11-07 DIAGNOSIS — G43909 Migraine, unspecified, not intractable, without status migrainosus: Secondary | ICD-10-CM | POA: Diagnosis not present

## 2016-11-07 DIAGNOSIS — Z96641 Presence of right artificial hip joint: Secondary | ICD-10-CM | POA: Diagnosis not present

## 2016-11-07 DIAGNOSIS — G8929 Other chronic pain: Secondary | ICD-10-CM | POA: Diagnosis not present

## 2016-11-09 DIAGNOSIS — G8929 Other chronic pain: Secondary | ICD-10-CM | POA: Diagnosis not present

## 2016-11-09 DIAGNOSIS — G43909 Migraine, unspecified, not intractable, without status migrainosus: Secondary | ICD-10-CM | POA: Diagnosis not present

## 2016-11-09 DIAGNOSIS — F603 Borderline personality disorder: Secondary | ICD-10-CM | POA: Diagnosis not present

## 2016-11-09 DIAGNOSIS — Z96641 Presence of right artificial hip joint: Secondary | ICD-10-CM | POA: Diagnosis not present

## 2016-11-09 DIAGNOSIS — Z471 Aftercare following joint replacement surgery: Secondary | ICD-10-CM | POA: Diagnosis not present

## 2016-11-09 DIAGNOSIS — M1612 Unilateral primary osteoarthritis, left hip: Secondary | ICD-10-CM | POA: Diagnosis not present

## 2016-11-13 DIAGNOSIS — Z96641 Presence of right artificial hip joint: Secondary | ICD-10-CM | POA: Diagnosis not present

## 2016-11-13 DIAGNOSIS — M25551 Pain in right hip: Secondary | ICD-10-CM | POA: Diagnosis not present

## 2016-11-13 DIAGNOSIS — R262 Difficulty in walking, not elsewhere classified: Secondary | ICD-10-CM | POA: Diagnosis not present

## 2016-11-13 DIAGNOSIS — M25651 Stiffness of right hip, not elsewhere classified: Secondary | ICD-10-CM | POA: Diagnosis not present

## 2016-11-21 DIAGNOSIS — M25561 Pain in right knee: Secondary | ICD-10-CM | POA: Diagnosis not present

## 2016-11-30 ENCOUNTER — Other Ambulatory Visit: Payer: Self-pay | Admitting: Obstetrics and Gynecology

## 2016-11-30 DIAGNOSIS — Z1231 Encounter for screening mammogram for malignant neoplasm of breast: Secondary | ICD-10-CM

## 2016-12-05 DIAGNOSIS — M545 Low back pain: Secondary | ICD-10-CM | POA: Diagnosis not present

## 2016-12-05 DIAGNOSIS — M25551 Pain in right hip: Secondary | ICD-10-CM | POA: Diagnosis not present

## 2016-12-13 DIAGNOSIS — M545 Low back pain: Secondary | ICD-10-CM | POA: Diagnosis not present

## 2016-12-21 ENCOUNTER — Ambulatory Visit
Admission: RE | Admit: 2016-12-21 | Discharge: 2016-12-21 | Disposition: A | Discharge status: Home / Self Care | Payer: Commercial Managed Care - HMO | Source: Ambulatory Visit

## 2016-12-21 DIAGNOSIS — Z1231 Encounter for screening mammogram for malignant neoplasm of breast: Secondary | ICD-10-CM | POA: Diagnosis not present

## 2017-01-01 DIAGNOSIS — M25551 Pain in right hip: Secondary | ICD-10-CM | POA: Diagnosis not present

## 2017-01-01 DIAGNOSIS — M4316 Spondylolisthesis, lumbar region: Secondary | ICD-10-CM | POA: Diagnosis not present

## 2017-01-01 DIAGNOSIS — M545 Low back pain: Secondary | ICD-10-CM | POA: Diagnosis not present

## 2017-01-16 DIAGNOSIS — M25551 Pain in right hip: Secondary | ICD-10-CM | POA: Diagnosis not present

## 2017-02-26 DIAGNOSIS — F339 Major depressive disorder, recurrent, unspecified: Secondary | ICD-10-CM | POA: Diagnosis not present

## 2017-02-26 DIAGNOSIS — D649 Anemia, unspecified: Secondary | ICD-10-CM | POA: Diagnosis not present

## 2017-02-26 DIAGNOSIS — G43909 Migraine, unspecified, not intractable, without status migrainosus: Secondary | ICD-10-CM | POA: Diagnosis not present

## 2017-02-26 DIAGNOSIS — L989 Disorder of the skin and subcutaneous tissue, unspecified: Secondary | ICD-10-CM | POA: Diagnosis not present

## 2017-05-07 DIAGNOSIS — D229 Melanocytic nevi, unspecified: Secondary | ICD-10-CM | POA: Diagnosis not present

## 2017-05-07 DIAGNOSIS — Z803 Family history of malignant neoplasm of breast: Secondary | ICD-10-CM | POA: Diagnosis not present

## 2017-07-10 DIAGNOSIS — M25561 Pain in right knee: Secondary | ICD-10-CM | POA: Diagnosis not present

## 2017-07-23 DIAGNOSIS — F341 Dysthymic disorder: Secondary | ICD-10-CM | POA: Diagnosis not present

## 2017-07-25 DIAGNOSIS — N760 Acute vaginitis: Secondary | ICD-10-CM | POA: Diagnosis not present

## 2017-07-31 DIAGNOSIS — Z0100 Encounter for examination of eyes and vision without abnormal findings: Secondary | ICD-10-CM | POA: Diagnosis not present

## 2017-08-21 DIAGNOSIS — F410 Panic disorder [episodic paroxysmal anxiety] without agoraphobia: Secondary | ICD-10-CM | POA: Diagnosis not present

## 2017-08-21 DIAGNOSIS — F329 Major depressive disorder, single episode, unspecified: Secondary | ICD-10-CM | POA: Diagnosis not present

## 2017-09-09 ENCOUNTER — Other Ambulatory Visit: Payer: Self-pay | Admitting: Obstetrics and Gynecology

## 2017-09-09 DIAGNOSIS — Z1239 Encounter for other screening for malignant neoplasm of breast: Secondary | ICD-10-CM

## 2017-09-12 DIAGNOSIS — R1013 Epigastric pain: Secondary | ICD-10-CM | POA: Diagnosis not present

## 2017-09-12 DIAGNOSIS — E538 Deficiency of other specified B group vitamins: Secondary | ICD-10-CM | POA: Diagnosis not present

## 2017-09-12 DIAGNOSIS — R51 Headache: Secondary | ICD-10-CM | POA: Diagnosis not present

## 2017-09-25 DIAGNOSIS — Z01419 Encounter for gynecological examination (general) (routine) without abnormal findings: Secondary | ICD-10-CM | POA: Diagnosis not present

## 2017-09-25 DIAGNOSIS — Z8619 Personal history of other infectious and parasitic diseases: Secondary | ICD-10-CM | POA: Diagnosis not present

## 2017-09-25 DIAGNOSIS — Z9189 Other specified personal risk factors, not elsewhere classified: Secondary | ICD-10-CM | POA: Diagnosis not present

## 2017-09-25 DIAGNOSIS — N898 Other specified noninflammatory disorders of vagina: Secondary | ICD-10-CM | POA: Diagnosis not present

## 2017-10-03 DIAGNOSIS — F339 Major depressive disorder, recurrent, unspecified: Secondary | ICD-10-CM | POA: Diagnosis not present

## 2017-10-03 DIAGNOSIS — Z0000 Encounter for general adult medical examination without abnormal findings: Secondary | ICD-10-CM | POA: Diagnosis not present

## 2017-10-03 DIAGNOSIS — Z23 Encounter for immunization: Secondary | ICD-10-CM | POA: Diagnosis not present

## 2017-10-03 DIAGNOSIS — F603 Borderline personality disorder: Secondary | ICD-10-CM | POA: Diagnosis not present

## 2017-10-03 DIAGNOSIS — E538 Deficiency of other specified B group vitamins: Secondary | ICD-10-CM | POA: Diagnosis not present

## 2017-10-03 DIAGNOSIS — G43909 Migraine, unspecified, not intractable, without status migrainosus: Secondary | ICD-10-CM | POA: Diagnosis not present

## 2017-10-04 DIAGNOSIS — M25561 Pain in right knee: Secondary | ICD-10-CM | POA: Diagnosis not present

## 2017-10-04 DIAGNOSIS — M25552 Pain in left hip: Secondary | ICD-10-CM | POA: Diagnosis not present

## 2017-10-04 DIAGNOSIS — M25551 Pain in right hip: Secondary | ICD-10-CM | POA: Diagnosis not present

## 2017-11-13 DIAGNOSIS — F410 Panic disorder [episodic paroxysmal anxiety] without agoraphobia: Secondary | ICD-10-CM | POA: Diagnosis not present

## 2017-12-23 ENCOUNTER — Ambulatory Visit
Admission: RE | Admit: 2017-12-23 | Discharge: 2017-12-23 | Disposition: A | Discharge status: Home / Self Care | Payer: Commercial Managed Care - HMO | Source: Ambulatory Visit | Attending: Obstetrics and Gynecology | Admitting: Obstetrics and Gynecology

## 2017-12-23 DIAGNOSIS — Z1231 Encounter for screening mammogram for malignant neoplasm of breast: Secondary | ICD-10-CM | POA: Diagnosis not present

## 2017-12-23 DIAGNOSIS — Z1239 Encounter for other screening for malignant neoplasm of breast: Secondary | ICD-10-CM

## 2017-12-24 HISTORY — PX: EYE SURGERY: SHX253

## 2018-02-12 DIAGNOSIS — F603 Borderline personality disorder: Secondary | ICD-10-CM | POA: Diagnosis not present

## 2018-02-12 DIAGNOSIS — E538 Deficiency of other specified B group vitamins: Secondary | ICD-10-CM | POA: Diagnosis not present

## 2018-02-12 DIAGNOSIS — F339 Major depressive disorder, recurrent, unspecified: Secondary | ICD-10-CM | POA: Diagnosis not present

## 2018-02-12 DIAGNOSIS — R439 Unspecified disturbances of smell and taste: Secondary | ICD-10-CM | POA: Diagnosis not present

## 2018-02-12 DIAGNOSIS — G43909 Migraine, unspecified, not intractable, without status migrainosus: Secondary | ICD-10-CM | POA: Diagnosis not present

## 2018-02-12 DIAGNOSIS — R634 Abnormal weight loss: Secondary | ICD-10-CM | POA: Diagnosis not present

## 2018-02-19 ENCOUNTER — Emergency Department (HOSPITAL_COMMUNITY)
Admission: EM | Admit: 2018-02-19 | Discharge: 2018-02-19 | Disposition: A | Discharge status: Home / Self Care | Payer: Medicare HMO | Attending: Emergency Medicine | Admitting: Emergency Medicine

## 2018-02-19 ENCOUNTER — Encounter (HOSPITAL_COMMUNITY): Payer: Self-pay | Admitting: *Deleted

## 2018-02-19 ENCOUNTER — Other Ambulatory Visit: Payer: Self-pay

## 2018-02-19 DIAGNOSIS — F419 Anxiety disorder, unspecified: Secondary | ICD-10-CM | POA: Diagnosis not present

## 2018-02-19 DIAGNOSIS — F321 Major depressive disorder, single episode, moderate: Secondary | ICD-10-CM | POA: Insufficient documentation

## 2018-02-19 DIAGNOSIS — Z87891 Personal history of nicotine dependence: Secondary | ICD-10-CM | POA: Insufficient documentation

## 2018-02-19 DIAGNOSIS — Z79899 Other long term (current) drug therapy: Secondary | ICD-10-CM | POA: Insufficient documentation

## 2018-02-19 DIAGNOSIS — Z7901 Long term (current) use of anticoagulants: Secondary | ICD-10-CM | POA: Diagnosis not present

## 2018-02-19 DIAGNOSIS — R4789 Other speech disturbances: Secondary | ICD-10-CM | POA: Diagnosis not present

## 2018-02-19 DIAGNOSIS — R410 Disorientation, unspecified: Secondary | ICD-10-CM | POA: Diagnosis not present

## 2018-02-19 DIAGNOSIS — R443 Hallucinations, unspecified: Secondary | ICD-10-CM | POA: Diagnosis not present

## 2018-02-19 LAB — COMPREHENSIVE METABOLIC PANEL
ALBUMIN: 4.5 g/dL (ref 3.5–5.0)
ALT: 11 U/L — AB (ref 14–54)
AST: 17 U/L (ref 15–41)
Alkaline Phosphatase: 62 U/L (ref 38–126)
Anion gap: 9 (ref 5–15)
BUN: 15 mg/dL (ref 6–20)
CO2: 20 mmol/L — AB (ref 22–32)
CREATININE: 0.97 mg/dL (ref 0.44–1.00)
Calcium: 9.4 mg/dL (ref 8.9–10.3)
Chloride: 113 mmol/L — ABNORMAL HIGH (ref 101–111)
GFR calc Af Amer: >60 mL/min (ref >60)
GFR calc non Af Amer: >60 mL/min (ref >60)
Glucose, Bld: 122 mg/dL — ABNORMAL HIGH (ref 65–99)
POTASSIUM: 3.6 mmol/L (ref 3.5–5.1)
SODIUM: 142 mmol/L (ref 135–145)
Total Bilirubin: 0.7 mg/dL (ref 0.3–1.2)
Total Protein: 6.6 g/dL (ref 6.5–8.1)

## 2018-02-19 LAB — ETHANOL: Alcohol, Ethyl (B): <10 mg/dL (ref <10)

## 2018-02-19 LAB — RAPID URINE DRUG SCREEN, HOSP PERFORMED
Amphetamines: NOT DETECTED
Barbiturates: NOT DETECTED
Benzodiazepines: POSITIVE — AB
Cocaine: NOT DETECTED
Opiates: NOT DETECTED
Tetrahydrocannabinol: POSITIVE — AB

## 2018-02-19 LAB — CBC
HEMATOCRIT: 38.6 % (ref 36.0–46.0)
Hemoglobin: 12.6 g/dL (ref 12.0–15.0)
MCH: 33.9 pg (ref 26.0–34.0)
MCHC: 32.6 g/dL (ref 30.0–36.0)
MCV: 103.8 fL — AB (ref 78.0–100.0)
Platelets: 170 10*3/uL (ref 150–400)
RBC: 3.72 MIL/uL — ABNORMAL LOW (ref 3.87–5.11)
RDW: 13.4 % (ref 11.5–15.5)
WBC: 6.6 10*3/uL (ref 4.0–10.5)

## 2018-02-19 LAB — SALICYLATE LEVEL

## 2018-02-19 LAB — ACETAMINOPHEN LEVEL: Acetaminophen (Tylenol), Serum: <10 ug/mL — ABNORMAL LOW (ref 10–30)

## 2018-02-19 NOTE — BHH Counselor (Signed)
Pt currently in the hallway.  Will complete the assessment when pt is moved, which should be in about 15 minutes.

## 2018-02-19 NOTE — ED Notes (Signed)
EDP and PA advised of patient behavior; Pt is to remain in clothing until TTS is complete and decision is made; EDP made aware that patient does not intend on staying and IVC may be needed if pt is to stay-Monique,RN

## 2018-02-19 NOTE — Discharge Instructions (Signed)
Please follow up with your PCP and your mental health team.   I have given you a list of outpatient counseling that may be available at a low cost or no cost option.   If you feel like you can not be safe, feel like you are a threat to your self or any one else please return to the Ed.

## 2018-02-19 NOTE — ED Notes (Addendum)
Pt refuses to get in purple scrubs and she got increasingly aggravated. Pt talking with TTS now.

## 2018-02-19 NOTE — ED Notes (Signed)
[facility] called to advised patient can be discharged; PA notified-Monique,RN

## 2018-02-19 NOTE — ED Notes (Signed)
Report to Monique RN

## 2018-02-19 NOTE — ED Triage Notes (Addendum)
PT states she is here for assistance with anxiety and "smelling things that no one else can smell".  She has been experiencing increased anxiety ever since Jan when a friend moved in to her (who has since moved out).  Pt with flight of ideas, denies HI or SI.  Pt due for MRI in 2 weeks.
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
## 2018-02-19 NOTE — ED Notes (Signed)
Pt continues to deny SI/HI to this RN, pt voices wishes to go home. Informed pt she would be speaking w/ someone via TTS to make plan

## 2018-02-19 NOTE — ED Notes (Signed)
Patient up to desk multiple times.  Crying, stating she can't leave but she needs to.  Patient hard to reassure.  Made aware of wait times and delays.  Patient verbalized understanding.

## 2018-02-19 NOTE — ED Notes (Signed)
Results reviewed.  No changes in acuity at this time 

## 2018-02-19 NOTE — ED Notes (Signed)
Patient transported to POD in with clothes and belongings; pt was advised of policy in Pod and asked to changed into scrubs and to place belongings in back to be locked away; pt beginning getting loud with RN and refused to change or give belongings; Pt sat on purse and states she knows her rights and she will be going home;RN explained that TTS has to be done and decsion will be made from there; pt insist she is going home; Security called to assist in getting pt into scrubs; pt is not IVC at this time and TTS will be done shortly-Monique, RN

## 2018-02-19 NOTE — ED Notes (Signed)
TTS in process 

## 2018-02-25 DIAGNOSIS — R439 Unspecified disturbances of smell and taste: Secondary | ICD-10-CM | POA: Diagnosis not present

## 2018-02-27 DIAGNOSIS — F603 Borderline personality disorder: Secondary | ICD-10-CM | POA: Diagnosis not present

## 2018-02-27 DIAGNOSIS — R431 Parosmia: Secondary | ICD-10-CM | POA: Diagnosis not present

## 2018-03-05 ENCOUNTER — Ambulatory Visit: Payer: Medicare HMO | Admitting: Psychology

## 2018-03-11 DIAGNOSIS — R439 Unspecified disturbances of smell and taste: Secondary | ICD-10-CM | POA: Diagnosis not present

## 2018-03-11 DIAGNOSIS — F603 Borderline personality disorder: Secondary | ICD-10-CM | POA: Diagnosis not present

## 2018-03-11 DIAGNOSIS — Z87891 Personal history of nicotine dependence: Secondary | ICD-10-CM | POA: Diagnosis not present

## 2018-03-24 ENCOUNTER — Ambulatory Visit: Payer: Medicare HMO | Admitting: Neurology

## 2018-03-25 ENCOUNTER — Ambulatory Visit: Payer: Medicare HMO | Admitting: Neurology

## 2018-03-26 ENCOUNTER — Encounter: Payer: Self-pay | Admitting: Neurology

## 2018-03-26 ENCOUNTER — Ambulatory Visit: Payer: Medicare HMO | Admitting: Neurology

## 2018-03-26 ENCOUNTER — Other Ambulatory Visit: Payer: Self-pay

## 2018-03-26 VITALS — BP 96/57 | HR 76 | Ht 60.0 in

## 2018-03-26 DIAGNOSIS — E538 Deficiency of other specified B group vitamins: Secondary | ICD-10-CM

## 2018-03-26 DIAGNOSIS — R442 Other hallucinations: Secondary | ICD-10-CM

## 2018-03-26 HISTORY — DX: Deficiency of other specified B group vitamins: E53.8

## 2018-03-26 MED ORDER — CYANOCOBALAMIN 1000 MCG/ML IJ SOLN
1000.0000 ug | Freq: Once | INTRAMUSCULAR | Status: AC
  Administered 2018-03-26: 1000 ug via INTRAMUSCULAR

## 2018-03-26 NOTE — Progress Notes (Signed)
GaveVitamin B12 1000mcgIM in right deltoid. Cleaned with alcohol wipe prior to injection. Band-aid applied. Pt tolerated well.  

## 2018-03-26 NOTE — Progress Notes (Signed)
Reason for visit: Olfactory hallucinations  Referring physician: Dr. Loney Hering Migdalia Dk is a 62 y.o. female  History of present illness:  Ms. Migdalia Dk is a 63 year old right-handed white female with a history of problems with depression followed by Dr. Toy Care.  The patient has a history of migraine headaches, but her headaches have been very well controlled on Topamax which she has been on for many years.  The patient is very thin, she claims that she is not currently losing weight on the Topamax.  The patient comes to the office today mainly for evaluation of what has been termed to be olfactory hallucinations.  The patient has a female cat at home.  She indicates that the cat was declawed in December 2018.  Shortly thereafter, the patient began noting the smell of cat feces and urine inside the house.  She had the carpets cleaned and this took care of the odor of cat feces.  The odor of urine still persists.  The patient claims that when she goes outside the house that the odor is not perceptible.  The patient notices that when she is in her bedroom that the odor is not present, but if she goes in the front room of the house and in her office the odor is present.  The patient does note that the cat goes into these rooms.  The patient reports that she does have some tingling in the hands on the Topamax, she also has a history of right carpal tunnel syndrome.  She does note some problems with mild gait instability but indicates that since her right total hip replacement that the right leg is longer than the left.  She has fallen on occasion but has not had any head injury.  The patient is not sleeping well.  He does have some problems with memory and concentration.  Her depression has not been well controlled recently.  The patient has had MRI of the brain that is completely normal, no sinus disease is noted.  The patient reports no significant problems with controlling the bowels or the bladder,  sometimes she has difficulty voiding the bladder.  She does occasionally have stress incontinence.  She was seen by Dr. Polly Cobia from ENT, no cause of the olfactory hallucinations was noted.  The patient has been determined to have a low B12 level of 140.  She has not been able to take oral supplementation of B12 as this upsets her stomach.  She comes into the office today for an evaluation.  Past Medical History:  Diagnosis Date  . Anxiety   . Arthritis   . Carpal tunnel syndrome of left wrist   . Complication of anesthesia    states she had to much and once adn ended up on o2, no problems since  . Cubital tunnel syndrome on left   . Depression   . Dyspnea   . GERD (gastroesophageal reflux disease)   . History of kidney stones   . History of suicide attempt 2012--  XANAX OVERDOSE  . Hypotension    reports that her SBP can run < 90  . Migraine   . Personality disorder ([place])   . Vitamin B12 deficiency 03/26/2018    Past Surgical History:  Procedure Laterality Date  . CARPAL TUNNEL RELEASE Left 05/13/2013   Procedure: CARPAL TUNNEL RELEASE ENDOSCOPIC;  Surgeon: Jolyn Nap, MD;  Location: [facility];  Service: Orthopedics;  Laterality: Left;  Incision at 0955.   Marland Kitchen  CERVICAL BIOPSY  W/ LOOP ELECTRODE EXCISION  12-01-2008   CIN II  . FEMORAL HERNIA REPAIR Left 10-31-2009   AND INGUINAL LYMPH NODE BX  . LUMBAR DISC SURGERY  X3   LAST ONE 1989   FUSION  . NERVE REPAIR Left 05/13/2013   Procedure: ULNAR NEUROPLASTY AT ELBOW;  Surgeon: Jolyn Nap, MD;  Location: [facility];  Service: Orthopedics;  Laterality: Left;  . REPAIR PARTIAL FINGER AMPUTATION  1989  . TOTAL HIP ARTHROPLASTY Right 10/23/2016  . TOTAL HIP ARTHROPLASTY Right 10/23/2016   Procedure: TOTAL HIP ARTHROPLASTY ANTERIOR APPROACH;  Surgeon: Renette Butters, MD;  Location: [place];  Service: Orthopedics;  Laterality: Right;    History reviewed. No pertinent family history.  Social  history:  reports that she has quit smoking. She has never used smokeless tobacco. She reports that she has current or past drug history. Drug: Marijuana. She reports that she does not drink alcohol.  Medications:  Prior to Admission medications   Medication Sig Start Date End Date Taking? Authorizing Provider  clonazePAM (KLONOPIN) 1 MG tablet Take 0.5-2 mg by mouth 4 (four) times daily as needed for anxiety. 2 mg at bedtime (scheduled)   Yes [provider]  nefazodone (SERZONE) 200 MG tablet Take 200 mg by mouth daily.    Yes [provider]  topiramate (TOPAMAX) 50 MG tablet Take 250 mg by mouth daily.   Yes [provider]  traZODone (DESYREL) 50 MG tablet Take 50 mg by mouth at bedtime.   Yes [provider]  valACYclovir (VALTREX) 500 MG tablet Take 500 mg by mouth at bedtime.   Yes [provider]      Allergies  Allergen Reactions  . Citalopram     All SSRI's give her fever, insomnia  . Other     All antibiotics give her a yeast infection  . Zocor [Simvastatin] Anxiety    nervous    ROS:  Out of a complete 14 system review of symptoms, the patient complains only of the following symptoms, and all other reviewed systems are negative.  Olfactory hallucinations  Blood pressure (!) 96/57, pulse 76, height 5' (1.524 m).  Physical Exam  General: The patient is alert and cooperative at the time of the examination.  Eyes: Pupils are equal, round, and reactive to light. Discs are flat bilaterally.  Neck: The neck is supple, no carotid bruits are noted.  Respiratory: The respiratory examination is clear.  Cardiovascular: The cardiovascular examination reveals a regular rate and rhythm, no obvious murmurs or rubs are noted.  Skin: Extremities are without significant edema.  Neurologic Exam  Mental status: The patient is alert and oriented x 3 at the time of the examination. The patient has apparent normal recent and remote  memory, with an apparently normal attention span and concentration ability.  Cranial nerves: Facial symmetry is present. There is good sensation of the face to pinprick and soft touch bilaterally. The strength of the facial muscles and the muscles to head turning and shoulder shrug are normal bilaterally. Speech is well enunciated, no aphasia or dysarthria is noted. Extraocular movements are full. Visual fields are full. The tongue is midline, and the patient has symmetric elevation of the soft palate. No obvious hearing deficits are noted.  Motor: The motor testing reveals 5 over 5 strength of all 4 extremities. Good symmetric motor tone is noted throughout.  Sensory: Sensory testing is intact to pinprick, soft touch, vibration sensation, and position  sense on all 4 extremities. No evidence of extinction is noted.  Coordination: Cerebellar testing reveals good finger-nose-finger and heel-to-shin bilaterally.  Gait and station: Gait is wide-based.  Tandem gait is unsteady. Romberg is negative. No drift is seen.  Reflexes: Deep tendon reflexes are symmetric and normal bilaterally. Toes are downgoing bilaterally.   Assessment/Plan:  1.  History of migraine headache  2.  Olfactory hallucinations  3.  History of depression  4.  B12 deficiency  The patient reports no new medications that were added or subtracted or were changed around the time of onset of the urine odors.  The key point of the medical history is that the patient does not smell the cat urine odor all the time, she smells odor only in certain rooms of the house where the cat frequents.  The patient has a female cat which is more likely than a female to spray urine.  I suspected that the patient does not have olfactory hallucinations, she actually is smelling cat urine.  She is considering getting rid of the cat which I think will treat the underlying problem.  The patient does have a B12 deficiency, she has not been able to take oral  supplementation.  We will give her a B12 injection today.  The patient may require regular B12 injections over time.  The patient is very thin, she is on Topamax, her weight needs to be monitored closely.  If she continues to lose weight, the Topamax dose may need to be lowered or discontinued.  The patient does not report changes in taste of foods.  The patient will follow-up through this office if needed.  Jill Alexanders MD 03/26/2018 10:14 AM  [facility] [address] [place], [place] [address]  Phone [phone] Fax [phone]

## 2018-03-27 ENCOUNTER — Ambulatory Visit: Payer: Medicare HMO | Admitting: Neurology

## 2018-04-03 DIAGNOSIS — E538 Deficiency of other specified B group vitamins: Secondary | ICD-10-CM | POA: Diagnosis not present

## 2018-04-03 DIAGNOSIS — F339 Major depressive disorder, recurrent, unspecified: Secondary | ICD-10-CM | POA: Diagnosis not present

## 2018-04-03 DIAGNOSIS — F603 Borderline personality disorder: Secondary | ICD-10-CM | POA: Diagnosis not present

## 2018-04-03 DIAGNOSIS — E43 Unspecified severe protein-calorie malnutrition: Secondary | ICD-10-CM | POA: Diagnosis not present

## 2018-04-03 DIAGNOSIS — R634 Abnormal weight loss: Secondary | ICD-10-CM | POA: Diagnosis not present

## 2018-04-10 ENCOUNTER — Other Ambulatory Visit: Payer: Self-pay | Admitting: Obstetrics and Gynecology

## 2018-04-10 DIAGNOSIS — Z1231 Encounter for screening mammogram for malignant neoplasm of breast: Secondary | ICD-10-CM

## 2018-04-29 DIAGNOSIS — E441 Mild protein-calorie malnutrition: Secondary | ICD-10-CM | POA: Diagnosis not present

## 2018-04-29 DIAGNOSIS — G43909 Migraine, unspecified, not intractable, without status migrainosus: Secondary | ICD-10-CM | POA: Diagnosis not present

## 2018-04-29 DIAGNOSIS — G5621 Lesion of ulnar nerve, right upper limb: Secondary | ICD-10-CM | POA: Diagnosis not present

## 2018-05-13 DIAGNOSIS — H524 Presbyopia: Secondary | ICD-10-CM | POA: Diagnosis not present

## 2018-05-13 DIAGNOSIS — H52209 Unspecified astigmatism, unspecified eye: Secondary | ICD-10-CM | POA: Diagnosis not present

## 2018-05-13 DIAGNOSIS — Z0100 Encounter for examination of eyes and vision without abnormal findings: Secondary | ICD-10-CM | POA: Diagnosis not present

## 2018-05-13 DIAGNOSIS — H5203 Hypermetropia, bilateral: Secondary | ICD-10-CM | POA: Diagnosis not present

## 2018-05-24 DIAGNOSIS — S40012A Contusion of left shoulder, initial encounter: Secondary | ICD-10-CM | POA: Diagnosis not present

## 2018-05-24 DIAGNOSIS — W19XXXA Unspecified fall, initial encounter: Secondary | ICD-10-CM | POA: Diagnosis not present

## 2018-05-24 DIAGNOSIS — S5002XA Contusion of left elbow, initial encounter: Secondary | ICD-10-CM | POA: Diagnosis not present

## 2018-06-02 DIAGNOSIS — M8588 Other specified disorders of bone density and structure, other site: Secondary | ICD-10-CM | POA: Diagnosis not present

## 2018-06-24 DIAGNOSIS — E441 Mild protein-calorie malnutrition: Secondary | ICD-10-CM | POA: Diagnosis not present

## 2018-06-24 DIAGNOSIS — F603 Borderline personality disorder: Secondary | ICD-10-CM | POA: Diagnosis not present

## 2018-06-24 DIAGNOSIS — K5900 Constipation, unspecified: Secondary | ICD-10-CM | POA: Diagnosis not present

## 2018-06-24 DIAGNOSIS — H539 Unspecified visual disturbance: Secondary | ICD-10-CM | POA: Diagnosis not present

## 2018-06-24 DIAGNOSIS — F339 Major depressive disorder, recurrent, unspecified: Secondary | ICD-10-CM | POA: Diagnosis not present

## 2018-08-01 DIAGNOSIS — F339 Major depressive disorder, recurrent, unspecified: Secondary | ICD-10-CM | POA: Diagnosis not present

## 2018-08-01 DIAGNOSIS — E538 Deficiency of other specified B group vitamins: Secondary | ICD-10-CM | POA: Diagnosis not present

## 2018-08-01 DIAGNOSIS — E441 Mild protein-calorie malnutrition: Secondary | ICD-10-CM | POA: Diagnosis not present

## 2018-08-01 DIAGNOSIS — R202 Paresthesia of skin: Secondary | ICD-10-CM | POA: Diagnosis not present

## 2018-08-01 DIAGNOSIS — F603 Borderline personality disorder: Secondary | ICD-10-CM | POA: Diagnosis not present

## 2018-08-01 DIAGNOSIS — Z79899 Other long term (current) drug therapy: Secondary | ICD-10-CM | POA: Diagnosis not present

## 2018-08-01 DIAGNOSIS — K5900 Constipation, unspecified: Secondary | ICD-10-CM | POA: Diagnosis not present

## 2018-08-01 DIAGNOSIS — R51 Headache: Secondary | ICD-10-CM | POA: Diagnosis not present

## 2018-08-01 DIAGNOSIS — E785 Hyperlipidemia, unspecified: Secondary | ICD-10-CM | POA: Diagnosis not present

## 2018-08-26 DIAGNOSIS — H2513 Age-related nuclear cataract, bilateral: Secondary | ICD-10-CM | POA: Diagnosis not present

## 2018-08-26 DIAGNOSIS — H25043 Posterior subcapsular polar age-related cataract, bilateral: Secondary | ICD-10-CM | POA: Diagnosis not present

## 2018-08-26 DIAGNOSIS — H2512 Age-related nuclear cataract, left eye: Secondary | ICD-10-CM | POA: Diagnosis not present

## 2018-08-26 DIAGNOSIS — H18413 Arcus senilis, bilateral: Secondary | ICD-10-CM | POA: Diagnosis not present

## 2018-08-26 DIAGNOSIS — H04123 Dry eye syndrome of bilateral lacrimal glands: Secondary | ICD-10-CM | POA: Diagnosis not present

## 2018-08-26 DIAGNOSIS — H25013 Cortical age-related cataract, bilateral: Secondary | ICD-10-CM | POA: Diagnosis not present

## 2018-09-30 DIAGNOSIS — Z9189 Other specified personal risk factors, not elsewhere classified: Secondary | ICD-10-CM | POA: Diagnosis not present

## 2018-09-30 DIAGNOSIS — Z8619 Personal history of other infectious and parasitic diseases: Secondary | ICD-10-CM | POA: Diagnosis not present

## 2018-09-30 DIAGNOSIS — N907 Vulvar cyst: Secondary | ICD-10-CM | POA: Diagnosis not present

## 2018-10-20 DIAGNOSIS — Z01818 Encounter for other preprocedural examination: Secondary | ICD-10-CM | POA: Diagnosis not present

## 2018-10-20 DIAGNOSIS — H2512 Age-related nuclear cataract, left eye: Secondary | ICD-10-CM | POA: Diagnosis not present

## 2018-11-03 DIAGNOSIS — M79641 Pain in right hand: Secondary | ICD-10-CM | POA: Diagnosis not present

## 2018-11-03 DIAGNOSIS — M25551 Pain in right hip: Secondary | ICD-10-CM | POA: Diagnosis not present

## 2018-11-03 DIAGNOSIS — F339 Major depressive disorder, recurrent, unspecified: Secondary | ICD-10-CM | POA: Diagnosis not present

## 2018-11-03 DIAGNOSIS — Z23 Encounter for immunization: Secondary | ICD-10-CM | POA: Diagnosis not present

## 2018-11-03 DIAGNOSIS — G43909 Migraine, unspecified, not intractable, without status migrainosus: Secondary | ICD-10-CM | POA: Diagnosis not present

## 2018-11-03 DIAGNOSIS — D696 Thrombocytopenia, unspecified: Secondary | ICD-10-CM | POA: Diagnosis not present

## 2018-11-06 DIAGNOSIS — H2512 Age-related nuclear cataract, left eye: Secondary | ICD-10-CM | POA: Diagnosis not present

## 2018-11-06 DIAGNOSIS — H25812 Combined forms of age-related cataract, left eye: Secondary | ICD-10-CM | POA: Diagnosis not present

## 2018-11-26 DIAGNOSIS — H25811 Combined forms of age-related cataract, right eye: Secondary | ICD-10-CM | POA: Diagnosis not present

## 2018-11-26 DIAGNOSIS — H2511 Age-related nuclear cataract, right eye: Secondary | ICD-10-CM | POA: Diagnosis not present

## 2018-12-25 ENCOUNTER — Ambulatory Visit
Admission: RE | Admit: 2018-12-25 | Discharge: 2018-12-25 | Disposition: A | Discharge status: Home / Self Care | Payer: Medicare HMO | Source: Ambulatory Visit | Attending: Obstetrics and Gynecology | Admitting: Obstetrics and Gynecology

## 2018-12-25 DIAGNOSIS — Z1231 Encounter for screening mammogram for malignant neoplasm of breast: Secondary | ICD-10-CM | POA: Diagnosis not present

## 2018-12-26 DIAGNOSIS — H524 Presbyopia: Secondary | ICD-10-CM | POA: Diagnosis not present

## 2018-12-26 DIAGNOSIS — H52209 Unspecified astigmatism, unspecified eye: Secondary | ICD-10-CM | POA: Diagnosis not present

## 2018-12-26 DIAGNOSIS — H5213 Myopia, bilateral: Secondary | ICD-10-CM | POA: Diagnosis not present

## 2019-01-07 DIAGNOSIS — G5621 Lesion of ulnar nerve, right upper limb: Secondary | ICD-10-CM | POA: Diagnosis not present

## 2019-01-07 DIAGNOSIS — G5602 Carpal tunnel syndrome, left upper limb: Secondary | ICD-10-CM | POA: Diagnosis not present

## 2019-01-07 DIAGNOSIS — G5601 Carpal tunnel syndrome, right upper limb: Secondary | ICD-10-CM | POA: Diagnosis not present

## 2019-01-23 DIAGNOSIS — G5621 Lesion of ulnar nerve, right upper limb: Secondary | ICD-10-CM | POA: Diagnosis not present

## 2019-01-23 DIAGNOSIS — G5601 Carpal tunnel syndrome, right upper limb: Secondary | ICD-10-CM | POA: Diagnosis not present

## 2019-02-03 DIAGNOSIS — G5601 Carpal tunnel syndrome, right upper limb: Secondary | ICD-10-CM | POA: Diagnosis not present

## 2019-02-03 DIAGNOSIS — G5621 Lesion of ulnar nerve, right upper limb: Secondary | ICD-10-CM | POA: Diagnosis not present

## 2019-03-05 DIAGNOSIS — Z23 Encounter for immunization: Secondary | ICD-10-CM | POA: Diagnosis not present

## 2019-03-05 DIAGNOSIS — G43909 Migraine, unspecified, not intractable, without status migrainosus: Secondary | ICD-10-CM | POA: Diagnosis not present

## 2019-03-05 DIAGNOSIS — F339 Major depressive disorder, recurrent, unspecified: Secondary | ICD-10-CM | POA: Diagnosis not present

## 2019-03-05 DIAGNOSIS — S61011A Laceration without foreign body of right thumb without damage to nail, initial encounter: Secondary | ICD-10-CM | POA: Diagnosis not present

## 2019-04-28 DIAGNOSIS — G5621 Lesion of ulnar nerve, right upper limb: Secondary | ICD-10-CM | POA: Diagnosis not present

## 2019-04-28 DIAGNOSIS — G5601 Carpal tunnel syndrome, right upper limb: Secondary | ICD-10-CM | POA: Diagnosis not present

## 2019-06-28 DIAGNOSIS — S0512XA Contusion of eyeball and orbital tissues, left eye, initial encounter: Secondary | ICD-10-CM | POA: Diagnosis not present

## 2019-07-08 DIAGNOSIS — S0512XD Contusion of eyeball and orbital tissues, left eye, subsequent encounter: Secondary | ICD-10-CM | POA: Diagnosis not present

## 2019-07-08 DIAGNOSIS — R5383 Other fatigue: Secondary | ICD-10-CM | POA: Diagnosis not present

## 2019-07-08 DIAGNOSIS — R51 Headache: Secondary | ICD-10-CM | POA: Diagnosis not present

## 2019-07-08 DIAGNOSIS — G43909 Migraine, unspecified, not intractable, without status migrainosus: Secondary | ICD-10-CM | POA: Diagnosis not present

## 2019-07-20 ENCOUNTER — Other Ambulatory Visit: Payer: Medicare HMO

## 2019-07-20 ENCOUNTER — Other Ambulatory Visit: Payer: Self-pay | Admitting: Family Medicine

## 2019-07-20 DIAGNOSIS — R519 Headache, unspecified: Secondary | ICD-10-CM

## 2019-07-21 ENCOUNTER — Ambulatory Visit
Admission: RE | Admit: 2019-07-21 | Discharge: 2019-07-21 | Disposition: A | Discharge status: Home / Self Care | Payer: Medicare HMO | Source: Ambulatory Visit | Attending: Family Medicine | Admitting: Family Medicine

## 2019-07-21 DIAGNOSIS — R519 Headache, unspecified: Secondary | ICD-10-CM

## 2019-07-21 DIAGNOSIS — R51 Headache: Secondary | ICD-10-CM | POA: Diagnosis not present

## 2019-07-30 ENCOUNTER — Other Ambulatory Visit: Payer: Medicare HMO

## 2019-08-19 DIAGNOSIS — G43909 Migraine, unspecified, not intractable, without status migrainosus: Secondary | ICD-10-CM | POA: Diagnosis not present

## 2019-08-19 DIAGNOSIS — R51 Headache: Secondary | ICD-10-CM | POA: Diagnosis not present

## 2019-08-19 DIAGNOSIS — F339 Major depressive disorder, recurrent, unspecified: Secondary | ICD-10-CM | POA: Diagnosis not present

## 2019-08-19 DIAGNOSIS — F603 Borderline personality disorder: Secondary | ICD-10-CM | POA: Diagnosis not present

## 2019-08-19 DIAGNOSIS — J0110 Acute frontal sinusitis, unspecified: Secondary | ICD-10-CM | POA: Diagnosis not present

## 2019-09-03 DIAGNOSIS — M25511 Pain in right shoulder: Secondary | ICD-10-CM | POA: Diagnosis not present

## 2019-09-04 ENCOUNTER — Other Ambulatory Visit: Payer: Self-pay | Admitting: Orthopedic Surgery

## 2019-09-04 ENCOUNTER — Encounter (HOSPITAL_BASED_OUTPATIENT_CLINIC_OR_DEPARTMENT_OTHER): Payer: Self-pay | Admitting: *Deleted

## 2019-09-04 ENCOUNTER — Other Ambulatory Visit (HOSPITAL_COMMUNITY)
Admission: RE | Admit: 2019-09-04 | Discharge: 2019-09-04 | Disposition: A | Discharge status: Home / Self Care | Payer: Medicare HMO | Source: Ambulatory Visit | Attending: Orthopedic Surgery | Admitting: Orthopedic Surgery

## 2019-09-04 ENCOUNTER — Ambulatory Visit
Admission: RE | Admit: 2019-09-04 | Discharge: 2019-09-04 | Disposition: A | Discharge status: Home / Self Care | Payer: Medicare HMO | Source: Ambulatory Visit | Attending: Orthopedic Surgery | Admitting: Orthopedic Surgery

## 2019-09-04 ENCOUNTER — Other Ambulatory Visit: Payer: Self-pay

## 2019-09-04 DIAGNOSIS — S42251A Displaced fracture of greater tuberosity of right humerus, initial encounter for closed fracture: Secondary | ICD-10-CM | POA: Diagnosis not present

## 2019-09-04 DIAGNOSIS — Z01812 Encounter for preprocedural laboratory examination: Secondary | ICD-10-CM | POA: Insufficient documentation

## 2019-09-04 DIAGNOSIS — M25519 Pain in unspecified shoulder: Secondary | ICD-10-CM

## 2019-09-04 DIAGNOSIS — Z20828 Contact with and (suspected) exposure to other viral communicable diseases: Secondary | ICD-10-CM | POA: Diagnosis not present

## 2019-09-04 DIAGNOSIS — M25551 Pain in right hip: Secondary | ICD-10-CM | POA: Diagnosis not present

## 2019-09-04 DIAGNOSIS — S42211A Unspecified displaced fracture of surgical neck of right humerus, initial encounter for closed fracture: Secondary | ICD-10-CM | POA: Diagnosis not present

## 2019-09-05 LAB — NOVEL CORONAVIRUS, NAA (HOSP ORDER, SEND-OUT TO REF LAB; TAT 18-24 HRS): SARS-CoV-2, NAA: NOT DETECTED

## 2019-09-06 ENCOUNTER — Encounter (HOSPITAL_BASED_OUTPATIENT_CLINIC_OR_DEPARTMENT_OTHER): Payer: Self-pay | Admitting: Physician Assistant

## 2019-09-06 DIAGNOSIS — S42209A Unspecified fracture of upper end of unspecified humerus, initial encounter for closed fracture: Secondary | ICD-10-CM | POA: Diagnosis present

## 2019-09-06 NOTE — H&P (Signed)
[name] is an 63 y.o. female.   Chief Complaint: right proximal humerus fracture HPI: Patient fell in the shower on Wednesday.  Injured right shoulder.  Scheduled for surgery by Dr Fredonia Highland.  Medical Doctor Dr Harlan Stains is patient's primary care.  Past Medical History:  Diagnosis Date  . Anxiety   . Arthritis   . Carpal tunnel syndrome of left wrist   . Complication of anesthesia    states she had to much and once adn ended up on o2, no problems since  . Cubital tunnel syndrome on left   . Depression   . Dyspnea   . GERD (gastroesophageal reflux disease)   . History of kidney stones   . History of suicide attempt 2012--  XANAX OVERDOSE  . Hypotension    reports that her SBP can run < 90  . Migraine   . Personality disorder ([place])   . Proximal humerus fracture    right  . Vitamin B12 deficiency 03/26/2018    Past Surgical History:  Procedure Laterality Date  . CARPAL TUNNEL RELEASE Left 05/13/2013   Procedure: CARPAL TUNNEL RELEASE ENDOSCOPIC;  Surgeon: Jolyn Nap, MD;  Location: [facility];  Service: Orthopedics;  Laterality: Left;  Incision at 0955.   Marland Kitchen CERVICAL BIOPSY  W/ LOOP ELECTRODE EXCISION  12-01-2008   CIN II  . FEMORAL HERNIA REPAIR Left 10-31-2009   AND INGUINAL LYMPH NODE BX  . LUMBAR DISC SURGERY  X3   LAST ONE 1989   FUSION  . NERVE REPAIR Left 05/13/2013   Procedure: ULNAR NEUROPLASTY AT ELBOW;  Surgeon: Jolyn Nap, MD;  [facility];  Service: Orthopedics;  Laterality: Left;  . REPAIR PARTIAL FINGER AMPUTATION  1989  . TOTAL HIP ARTHROPLASTY Right 10/23/2016  . TOTAL HIP ARTHROPLASTY Right 10/23/2016   Procedure: TOTAL HIP ARTHROPLASTY ANTERIOR APPROACH;  Surgeon: Renette Butters, MD;  Location: [place];  Service: Orthopedics;  Laterality: Right;    Family History  Problem Relation Age of Onset  . Breast cancer Mother    Social History:  reports that she has quit smoking. She has never used  smokeless tobacco. She reports current drug use. Drug: Marijuana. She reports that she does not drink alcohol.  Allergies:  Allergies  Allergen Reactions  . Citalopram     All SSRI's give her fever, insomnia  . Other     All antibiotics give her a yeast infection  . Zocor [Simvastatin] Anxiety    nervous    No medications prior to admission.    No results found for this or any previous visit (from the past 48 hour(s)). No results found.  Review of Systems  Constitutional: Negative.   HENT: Negative.   Eyes: Negative.   Respiratory: Negative.   Cardiovascular: Negative.   Gastrointestinal: Negative.   Genitourinary: Negative.   Musculoskeletal: Positive for joint pain.  Skin: Negative.   Neurological: Negative.   Endo/Heme/Allergies: Negative.   Psychiatric/Behavioral: Negative.     Height 5' (1.524 m), weight 47.2 kg. Physical Exam  Constitutional: She appears well-developed and well-nourished.  HENT:  Head: Normocephalic and atraumatic.  Mouth/Throat: Oropharynx is clear and moist.  Eyes: Pupils are equal, round, and reactive to light.  Neck: Neck supple.  Cardiovascular: Normal rate.  Respiratory: Effort normal.  GI: Soft.  Genitourinary:    Genitourinary Comments: Not pertinent to current symptomatology therefore not examined.   Musculoskeletal:     Comments: Right shoulder and  arm pain   DNVI   Also having right elbow pain   Will xray Elbow day of surgery  Neurological: She is alert.  Skin: Skin is warm.  Psychiatric: She has a normal mood and affect. Her behavior is normal.     Assessment Principal Problem:   Proximal humerus fracture Active Problems:   Hypotension, unspecified   Migraines   Chronic back pain   Acute blood loss anemia   Plan Open reduction internal fixation proximal humerus fracture.  The risks, benefits, and possible complications of the procedure were discussed in detail with the patient.  The patient is without  question.  [name] Susan Culliton, PA-C 09/06/2019, 2:30 PM

## 2019-09-08 ENCOUNTER — Other Ambulatory Visit: Payer: Self-pay

## 2019-09-08 ENCOUNTER — Ambulatory Visit (HOSPITAL_BASED_OUTPATIENT_CLINIC_OR_DEPARTMENT_OTHER): Payer: Medicare HMO | Admitting: Anesthesiology

## 2019-09-08 ENCOUNTER — Ambulatory Visit (HOSPITAL_BASED_OUTPATIENT_CLINIC_OR_DEPARTMENT_OTHER)
Admission: RE | Admit: 2019-09-08 | Discharge: 2019-09-08 | Disposition: A | Discharge status: Nursing Home (Includes ICF) | Payer: Medicare HMO | Attending: Orthopedic Surgery | Admitting: Orthopedic Surgery

## 2019-09-08 ENCOUNTER — Emergency Department (HOSPITAL_COMMUNITY)
Admission: EM | Admit: 2019-09-08 | Discharge: 2019-09-09 | Disposition: A | Discharge status: Home / Self Care | Payer: Medicare HMO | Source: Home / Self Care | Attending: Emergency Medicine | Admitting: Emergency Medicine

## 2019-09-08 ENCOUNTER — Encounter (HOSPITAL_BASED_OUTPATIENT_CLINIC_OR_DEPARTMENT_OTHER)
Admission: RE | Disposition: A | Discharge status: Nursing Home (Includes ICF) | Payer: Self-pay | Source: Home / Self Care | Attending: Orthopedic Surgery

## 2019-09-08 ENCOUNTER — Encounter (HOSPITAL_BASED_OUTPATIENT_CLINIC_OR_DEPARTMENT_OTHER): Payer: Self-pay | Admitting: *Deleted

## 2019-09-08 DIAGNOSIS — M549 Dorsalgia, unspecified: Secondary | ICD-10-CM | POA: Insufficient documentation

## 2019-09-08 DIAGNOSIS — Z87891 Personal history of nicotine dependence: Secondary | ICD-10-CM | POA: Diagnosis not present

## 2019-09-08 DIAGNOSIS — F329 Major depressive disorder, single episode, unspecified: Secondary | ICD-10-CM | POA: Diagnosis not present

## 2019-09-08 DIAGNOSIS — G8918 Other acute postprocedural pain: Secondary | ICD-10-CM | POA: Diagnosis not present

## 2019-09-08 DIAGNOSIS — G43909 Migraine, unspecified, not intractable, without status migrainosus: Secondary | ICD-10-CM | POA: Diagnosis present

## 2019-09-08 DIAGNOSIS — W010XXA Fall on same level from slipping, tripping and stumbling without subsequent striking against object, initial encounter: Secondary | ICD-10-CM | POA: Insufficient documentation

## 2019-09-08 DIAGNOSIS — Z96641 Presence of right artificial hip joint: Secondary | ICD-10-CM | POA: Insufficient documentation

## 2019-09-08 DIAGNOSIS — Y93E1 Activity, personal bathing and showering: Secondary | ICD-10-CM | POA: Insufficient documentation

## 2019-09-08 DIAGNOSIS — S42293A Other displaced fracture of upper end of unspecified humerus, initial encounter for closed fracture: Secondary | ICD-10-CM | POA: Diagnosis present

## 2019-09-08 DIAGNOSIS — F29 Unspecified psychosis not due to a substance or known physiological condition: Secondary | ICD-10-CM | POA: Diagnosis not present

## 2019-09-08 DIAGNOSIS — F419 Anxiety disorder, unspecified: Secondary | ICD-10-CM | POA: Diagnosis not present

## 2019-09-08 DIAGNOSIS — R45851 Suicidal ideations: Secondary | ICD-10-CM

## 2019-09-08 DIAGNOSIS — S42201A Unspecified fracture of upper end of right humerus, initial encounter for closed fracture: Secondary | ICD-10-CM | POA: Diagnosis not present

## 2019-09-08 DIAGNOSIS — R456 Violent behavior: Secondary | ICD-10-CM | POA: Diagnosis not present

## 2019-09-08 DIAGNOSIS — S42209A Unspecified fracture of upper end of unspecified humerus, initial encounter for closed fracture: Secondary | ICD-10-CM

## 2019-09-08 DIAGNOSIS — D62 Acute posthemorrhagic anemia: Secondary | ICD-10-CM | POA: Diagnosis present

## 2019-09-08 DIAGNOSIS — I959 Hypotension, unspecified: Secondary | ICD-10-CM | POA: Diagnosis not present

## 2019-09-08 DIAGNOSIS — G8929 Other chronic pain: Secondary | ICD-10-CM | POA: Diagnosis not present

## 2019-09-08 DIAGNOSIS — M1611 Unilateral primary osteoarthritis, right hip: Secondary | ICD-10-CM | POA: Diagnosis not present

## 2019-09-08 HISTORY — DX: Unspecified fracture of upper end of unspecified humerus, initial encounter for closed fracture: S42.209A

## 2019-09-08 HISTORY — PX: ORIF HUMERUS FRACTURE: SHX2126

## 2019-09-08 LAB — CBC WITH DIFFERENTIAL/PLATELET
Abs Immature Granulocytes: 0.02 10*3/uL (ref 0.00–0.07)
Basophils Absolute: 0 10*3/uL (ref 0.0–0.1)
Basophils Relative: 0 %
Eosinophils Absolute: 0 10*3/uL (ref 0.0–0.5)
Eosinophils Relative: 0 %
HCT: 27.5 % — ABNORMAL LOW (ref 36.0–46.0)
Hemoglobin: 9.2 g/dL — ABNORMAL LOW (ref 12.0–15.0)
Immature Granulocytes: 0 %
Lymphocytes Relative: 9 %
Lymphs Abs: 0.5 10*3/uL — ABNORMAL LOW (ref 0.7–4.0)
MCH: 34.5 pg — ABNORMAL HIGH (ref 26.0–34.0)
MCHC: 33.5 g/dL (ref 30.0–36.0)
MCV: 103 fL — ABNORMAL HIGH (ref 80.0–100.0)
Monocytes Absolute: 0.1 10*3/uL (ref 0.1–1.0)
Monocytes Relative: 2 %
Neutro Abs: 5.4 10*3/uL (ref 1.7–7.7)
Neutrophils Relative %: 89 %
Platelets: 141 10*3/uL — ABNORMAL LOW (ref 150–400)
RBC: 2.67 MIL/uL — ABNORMAL LOW (ref 3.87–5.11)
RDW: 12.5 % (ref 11.5–15.5)
WBC: 6.1 10*3/uL (ref 4.0–10.5)
nRBC: 0 % (ref 0.0–0.2)

## 2019-09-08 LAB — COMPREHENSIVE METABOLIC PANEL
ALT: 13 U/L (ref 0–44)
AST: 22 U/L (ref 15–41)
Albumin: 3.2 g/dL — ABNORMAL LOW (ref 3.5–5.0)
Alkaline Phosphatase: 52 U/L (ref 38–126)
Anion gap: 9 (ref 5–15)
BUN: 19 mg/dL (ref 8–23)
CO2: 20 mmol/L — ABNORMAL LOW (ref 22–32)
Calcium: 8.9 mg/dL (ref 8.9–10.3)
Chloride: 111 mmol/L (ref 98–111)
Creatinine, Ser: 0.94 mg/dL (ref 0.44–1.00)
GFR calc Af Amer: >60 mL/min (ref >60)
GFR calc non Af Amer: >60 mL/min (ref >60)
Glucose, Bld: 176 mg/dL — ABNORMAL HIGH (ref 70–99)
Potassium: 4.9 mmol/L (ref 3.5–5.1)
Sodium: 140 mmol/L (ref 135–145)
Total Bilirubin: 0.8 mg/dL (ref 0.3–1.2)
Total Protein: 5.5 g/dL — ABNORMAL LOW (ref 6.5–8.1)

## 2019-09-08 LAB — ETHANOL: Alcohol, Ethyl (B): <10 mg/dL (ref <10)

## 2019-09-08 SURGERY — OPEN REDUCTION INTERNAL FIXATION (ORIF) HUMERAL SHAFT FRACTURE
Anesthesia: General | Site: Shoulder | Laterality: Right

## 2019-09-08 MED ORDER — BUPIVACAINE LIPOSOME 1.3 % IJ SUSP
INTRAMUSCULAR | Status: DC | PRN
  Administered 2019-09-08: 10 mL via PERINEURAL

## 2019-09-08 MED ORDER — FENTANYL CITRATE (PF) 100 MCG/2ML IJ SOLN
50.0000 ug | INTRAMUSCULAR | Status: DC | PRN
  Administered 2019-09-08 (×2): 50 ug via INTRAVENOUS

## 2019-09-08 MED ORDER — PHENYLEPHRINE 40 MCG/ML (10ML) SYRINGE FOR IV PUSH (FOR BLOOD PRESSURE SUPPORT)
PREFILLED_SYRINGE | INTRAVENOUS | Status: AC
  Filled 2019-09-08: qty 10

## 2019-09-08 MED ORDER — ONDANSETRON HCL 4 MG PO TABS: 4.0000 mg | ORAL_TABLET | Freq: Four times a day (QID) | ORAL | Status: DC | PRN

## 2019-09-08 MED ORDER — TRAMADOL HCL 50 MG PO TABS: 50.0000 mg | ORAL_TABLET | ORAL | Status: DC | PRN

## 2019-09-08 MED ORDER — NEFAZODONE HCL 200 MG PO TABS: 200.0000 mg | ORAL_TABLET | Freq: Every day | ORAL | Status: DC

## 2019-09-08 MED ORDER — MIDAZOLAM HCL 2 MG/2ML IJ SOLN
1.0000 mg | INTRAMUSCULAR | Status: DC | PRN
  Administered 2019-09-08: 2 mg via INTRAVENOUS

## 2019-09-08 MED ORDER — TRANEXAMIC ACID-NACL 1000-0.7 MG/100ML-% IV SOLN
1000.0000 mg | INTRAVENOUS | Status: AC
  Administered 2019-09-08: 1000 mg via INTRAVENOUS

## 2019-09-08 MED ORDER — LIDOCAINE HCL (CARDIAC) PF 100 MG/5ML IV SOSY
PREFILLED_SYRINGE | INTRAVENOUS | Status: DC | PRN
  Administered 2019-09-08: 40 mg via INTRAVENOUS

## 2019-09-08 MED ORDER — FENTANYL CITRATE (PF) 100 MCG/2ML IJ SOLN: 25.0000 ug | INTRAMUSCULAR | Status: DC | PRN

## 2019-09-08 MED ORDER — PHENYLEPHRINE HCL (PRESSORS) 10 MG/ML IV SOLN
INTRAVENOUS | Status: DC | PRN
  Administered 2019-09-08 (×5): 80 ug via INTRAVENOUS

## 2019-09-08 MED ORDER — PROPOFOL 10 MG/ML IV BOLUS
INTRAVENOUS | Status: DC | PRN
  Administered 2019-09-08: 90 mg via INTRAVENOUS

## 2019-09-08 MED ORDER — SCOPOLAMINE 1 MG/3DAYS TD PT72: 1.0000 | MEDICATED_PATCH | Freq: Once | TRANSDERMAL | Status: DC

## 2019-09-08 MED ORDER — OXYCODONE HCL 5 MG PO TABS: 5.0000 mg | ORAL_TABLET | Freq: Once | ORAL | Status: DC | PRN

## 2019-09-08 MED ORDER — MIDAZOLAM HCL 2 MG/2ML IJ SOLN
INTRAMUSCULAR | Status: AC
  Filled 2019-09-08: qty 2

## 2019-09-08 MED ORDER — LACTATED RINGERS IV SOLN
INTRAVENOUS | Status: DC
  Administered 2019-09-08: 15:00:00 via INTRAVENOUS

## 2019-09-08 MED ORDER — ONDANSETRON HCL 4 MG/2ML IJ SOLN: 4.0000 mg | Freq: Four times a day (QID) | INTRAMUSCULAR | Status: DC | PRN

## 2019-09-08 MED ORDER — POVIDONE-IODINE 7.5 % EX SOLN: Freq: Once | CUTANEOUS | Status: DC

## 2019-09-08 MED ORDER — DEXAMETHASONE SODIUM PHOSPHATE 4 MG/ML IJ SOLN
INTRAMUSCULAR | Status: DC | PRN
  Administered 2019-09-08: 5 mg via INTRAVENOUS

## 2019-09-08 MED ORDER — ACETAMINOPHEN 500 MG PO TABS: 1000.0000 mg | ORAL_TABLET | Freq: Once | ORAL | Status: DC

## 2019-09-08 MED ORDER — LIDOCAINE 2% (20 MG/ML) 5 ML SYRINGE
INTRAMUSCULAR | Status: AC
  Filled 2019-09-08: qty 5

## 2019-09-08 MED ORDER — VALACYCLOVIR HCL 500 MG PO TABS: 500.0000 mg | ORAL_TABLET | Freq: Every day | ORAL | Status: DC

## 2019-09-08 MED ORDER — BUPIVACAINE-EPINEPHRINE (PF) 0.5% -1:200000 IJ SOLN
INTRAMUSCULAR | Status: DC | PRN
  Administered 2019-09-08: 15 mL via PERINEURAL

## 2019-09-08 MED ORDER — CEFAZOLIN SODIUM-DEXTROSE 2-4 GM/100ML-% IV SOLN: 2.0000 g | Freq: Four times a day (QID) | INTRAVENOUS | Status: DC

## 2019-09-08 MED ORDER — ONDANSETRON HCL 4 MG/2ML IJ SOLN
INTRAMUSCULAR | Status: DC | PRN
  Administered 2019-09-08: 4 mg via INTRAVENOUS

## 2019-09-08 MED ORDER — CEFAZOLIN SODIUM-DEXTROSE 2-4 GM/100ML-% IV SOLN
INTRAVENOUS | Status: AC
  Filled 2019-09-08: qty 100

## 2019-09-08 MED ORDER — EPHEDRINE SULFATE 50 MG/ML IJ SOLN
INTRAMUSCULAR | Status: DC | PRN
  Administered 2019-09-08 (×3): 10 mg via INTRAVENOUS

## 2019-09-08 MED ORDER — GABAPENTIN 300 MG PO CAPS: 300.0000 mg | ORAL_CAPSULE | Freq: Three times a day (TID) | ORAL | Status: DC

## 2019-09-08 MED ORDER — LACTATED RINGERS IV SOLN
INTRAVENOUS | Status: DC
  Administered 2019-09-08: 11:00:00 via INTRAVENOUS

## 2019-09-08 MED ORDER — ACETAMINOPHEN 500 MG PO TABS: 500.0000 mg | ORAL_TABLET | Freq: Four times a day (QID) | ORAL | Status: DC

## 2019-09-08 MED ORDER — POVIDONE-IODINE 10 % EX SWAB
2.0000 "application " | Freq: Once | CUTANEOUS | Status: AC
  Administered 2019-09-08: 2 via TOPICAL

## 2019-09-08 MED ORDER — PROPOFOL 10 MG/ML IV BOLUS
INTRAVENOUS | Status: AC
  Filled 2019-09-08: qty 20

## 2019-09-08 MED ORDER — METOCLOPRAMIDE HCL 5 MG/ML IJ SOLN: 5.0000 mg | Freq: Three times a day (TID) | INTRAMUSCULAR | Status: DC | PRN

## 2019-09-08 MED ORDER — OXYCODONE HCL 5 MG/5ML PO SOLN: 5.0000 mg | Freq: Once | ORAL | Status: DC | PRN

## 2019-09-08 MED ORDER — PROMETHAZINE HCL 25 MG/ML IJ SOLN: 6.2500 mg | INTRAMUSCULAR | Status: DC | PRN

## 2019-09-08 MED ORDER — FENTANYL CITRATE (PF) 100 MCG/2ML IJ SOLN
INTRAMUSCULAR | Status: AC
  Filled 2019-09-08: qty 2

## 2019-09-08 MED ORDER — ROCURONIUM BROMIDE 100 MG/10ML IV SOLN
INTRAVENOUS | Status: DC | PRN
  Administered 2019-09-08: 30 mg via INTRAVENOUS

## 2019-09-08 MED ORDER — EPHEDRINE 5 MG/ML INJ
INTRAVENOUS | Status: AC
  Filled 2019-09-08: qty 10

## 2019-09-08 MED ORDER — TOPIRAMATE 25 MG PO TABS: 250.0000 mg | ORAL_TABLET | Freq: Every day | ORAL | Status: DC

## 2019-09-08 MED ORDER — MORPHINE SULFATE (PF) 4 MG/ML IV SOLN: 0.5000 mg | INTRAVENOUS | Status: DC | PRN

## 2019-09-08 MED ORDER — ROCURONIUM BROMIDE 10 MG/ML (PF) SYRINGE
PREFILLED_SYRINGE | INTRAVENOUS | Status: AC
  Filled 2019-09-08: qty 10

## 2019-09-08 MED ORDER — HYDROCODONE-ACETAMINOPHEN 5-325 MG PO TABS: 1.0000 | ORAL_TABLET | ORAL | Status: DC | PRN

## 2019-09-08 MED ORDER — ONDANSETRON HCL 4 MG/2ML IJ SOLN
INTRAMUSCULAR | Status: AC
  Filled 2019-09-08: qty 2

## 2019-09-08 MED ORDER — METOCLOPRAMIDE HCL 5 MG PO TABS: 5.0000 mg | ORAL_TABLET | Freq: Three times a day (TID) | ORAL | Status: DC | PRN

## 2019-09-08 MED ORDER — SUGAMMADEX SODIUM 200 MG/2ML IV SOLN
INTRAVENOUS | Status: DC | PRN
  Administered 2019-09-08: 100 mg via INTRAVENOUS

## 2019-09-08 MED ORDER — TRAZODONE HCL 50 MG PO TABS: 50.0000 mg | ORAL_TABLET | Freq: Every day | ORAL | Status: DC

## 2019-09-08 MED ORDER — CLONAZEPAM 0.5 MG PO TABS: 0.5000 mg | ORAL_TABLET | Freq: Four times a day (QID) | ORAL | Status: DC | PRN

## 2019-09-08 MED ORDER — CEFAZOLIN SODIUM-DEXTROSE 2-4 GM/100ML-% IV SOLN
2.0000 g | INTRAVENOUS | Status: AC
  Administered 2019-09-08: 2 g via INTRAVENOUS

## 2019-09-08 SURGICAL SUPPLY — 69 items
AID PSTN UNV HD RSTRNT DISP (MISCELLANEOUS) ×2
APL PRP STRL LF DISP 70% ISPRP (MISCELLANEOUS) ×2
BIT DRILL 3.2 (BIT) ×4
BIT DRILL 3.2XCALB NS DISP (BIT) IMPLANT
BIT DRILL CALIBRATED 2.7 (BIT) ×1 IMPLANT
BIT DRILL CALIBRATED 2.7MM (BIT) ×1
BIT DRL 3.2XCALB NS DISP (BIT) ×2
BLADE CLIPPER SURG (BLADE) IMPLANT
BLADE SURG 15 STRL LF DISP TIS (BLADE) ×2 IMPLANT
BLADE SURG 15 STRL SS (BLADE) ×4
CHLORAPREP W/TINT 26 (MISCELLANEOUS) ×4 IMPLANT
CLOSURE STERI-STRIP 1/2X4 (GAUZE/BANDAGES/DRESSINGS)
CLSR STERI-STRIP ANTIMIC 1/2X4 (GAUZE/BANDAGES/DRESSINGS) IMPLANT
COVER WAND RF STERILE (DRAPES) IMPLANT
DECANTER SPIKE VIAL GLASS SM (MISCELLANEOUS) IMPLANT
DRAPE C-ARM 42X72 X-RAY (DRAPES) IMPLANT
DRAPE IMP U-DRAPE 54X76 (DRAPES) ×4 IMPLANT
DRAPE INCISE IOBAN 66X45 STRL (DRAPES) ×4 IMPLANT
DRAPE OEC MINIVIEW 54X84 (DRAPES) ×2 IMPLANT
DRAPE SPLIT 6X30 W/TAPE (DRAPES) ×8 IMPLANT
DRAPE U-SHAPE 47X51 STRL (DRAPES) ×4 IMPLANT
DRSG AQUACEL AG ADV 3.5X 6 (GAUZE/BANDAGES/DRESSINGS) ×2 IMPLANT
DRSG MEPILEX BORDER 4X8 (GAUZE/BANDAGES/DRESSINGS) ×4 IMPLANT
ELECT REM PT RETURN 9FT ADLT (ELECTROSURGICAL) ×4
ELECTRODE REM PT RTRN 9FT ADLT (ELECTROSURGICAL) ×2 IMPLANT
GAUZE SPONGE 4X4 12PLY STRL (GAUZE/BANDAGES/DRESSINGS) ×4 IMPLANT
GAUZE XEROFORM 1X8 LF (GAUZE/BANDAGES/DRESSINGS) IMPLANT
GLOVE BIO SURGEON STRL SZ7.5 (GLOVE) ×8 IMPLANT
GLOVE BIOGEL PI IND STRL 7.0 (GLOVE) IMPLANT
GLOVE BIOGEL PI IND STRL 8 (GLOVE) ×4 IMPLANT
GLOVE BIOGEL PI INDICATOR 7.0 (GLOVE) ×6
GLOVE BIOGEL PI INDICATOR 8 (GLOVE) ×4
GLOVE ECLIPSE 6.5 STRL STRAW (GLOVE) ×4 IMPLANT
GOWN STRL REUS W/ TWL LRG LVL3 (GOWN DISPOSABLE) ×4 IMPLANT
GOWN STRL REUS W/ TWL XL LVL3 (GOWN DISPOSABLE) ×2 IMPLANT
GOWN STRL REUS W/TWL LRG LVL3 (GOWN DISPOSABLE) ×4
GOWN STRL REUS W/TWL XL LVL3 (GOWN DISPOSABLE) ×8
K-WIRE 2X5 SS THRDED S3 (WIRE) ×8
KWIRE 2X5 SS THRDED S3 (WIRE) IMPLANT
NS IRRIG 1000ML POUR BTL (IV SOLUTION) ×4 IMPLANT
PACK ARTHROSCOPY DSU (CUSTOM PROCEDURE TRAY) ×4 IMPLANT
PACK BASIN DAY SURGERY FS (CUSTOM PROCEDURE TRAY) ×4 IMPLANT
PEG LOCKING 3.2MMX46 (Peg) ×2 IMPLANT
PEG LOCKING 3.2X 28MM (Peg) ×4 IMPLANT
PEG LOCKING 3.2X32 (Peg) ×4 IMPLANT
PEG LOCKING 3.2X40 (Peg) ×4 IMPLANT
PENCIL BUTTON HOLSTER BLD 10FT (ELECTRODE) ×4 IMPLANT
PLATE 3HOLE HUMERUS PROX RT (Plate) ×2 IMPLANT
RESTRAINT HEAD UNIVERSAL NS (MISCELLANEOUS) ×4 IMPLANT
SCREW LOW PROF TIS 3.5X28MM (Screw) ×4 IMPLANT
SCREW LOW PROFILE 3.5X30MM TIS (Screw) ×2 IMPLANT
SLEEVE SCD COMPRESS KNEE MED (MISCELLANEOUS) IMPLANT
SLING ARM FOAM STRAP LRG (SOFTGOODS) ×4 IMPLANT
SPONGE LAP 18X18 RF (DISPOSABLE) ×8 IMPLANT
SUCTION FRAZIER HANDLE 10FR (MISCELLANEOUS) ×2
SUCTION TUBE FRAZIER 10FR DISP (MISCELLANEOUS) IMPLANT
SUT ETHILON 3 0 PS 1 (SUTURE) IMPLANT
SUT FIBERWIRE #2 38 T-5 BLUE (SUTURE)
SUT MNCRL AB 4-0 PS2 18 (SUTURE) ×4 IMPLANT
SUT MON AB 2-0 CT1 36 (SUTURE) ×4 IMPLANT
SUT RETRIEVER MED (INSTRUMENTS) IMPLANT
SUT VIC AB 0 CT1 27 (SUTURE)
SUT VIC AB 0 CT1 27XBRD ANBCTR (SUTURE) IMPLANT
SUT VIC AB 3-0 SH 27 (SUTURE)
SUT VIC AB 3-0 SH 27X BRD (SUTURE) IMPLANT
SUTURE FIBERWR #2 38 T-5 BLUE (SUTURE) IMPLANT
SYR BULB IRRIGATION 50ML (SYRINGE) ×4 IMPLANT
TOWEL GREEN STERILE FF (TOWEL DISPOSABLE) ×4 IMPLANT
YANKAUER SUCT BULB TIP NO VENT (SUCTIONS) ×4 IMPLANT

## 2019-09-08 NOTE — Interval H&P Note (Signed)
I participated in the care of this patient and agree with the above history, physical and evaluation. I performed a review of the history and a physical exam as detailed   Yenny Kosa Daniel Zafira Munos MD  
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
## 2019-09-08 NOTE — Transfer of Care (Signed)
Immediate Anesthesia Transfer of Care Note  Patient: [name]  Procedure(s) Performed: OPEN REDUCTION INTERNAL FIXATION (ORIF) HUMERAL FRACTURE (Right Shoulder)  Patient Location: PACU  Anesthesia Type:Regional, GEN  Level of Consciousness: awake, alert  and oriented  Airway & Oxygen Therapy: Patient Spontanous Breathing and Patient connected to nasal cannula oxygen  Post-op Assessment: Report given to RN and Post -op Vital signs reviewed and stable  Post vital signs: Reviewed and stable  Last Vitals:  Vitals Value Taken Time  BP 112/65 09/01/2019 1416  Temp    Pulse 86 08/25/2019 1418  Resp 13 08/30/2019 1418  SpO2 100 % 09/10/2019 1418  Vitals shown include unvalidated device data.  Last Pain:  Vitals:   09/01/2019 1031  TempSrc: Oral  PainSc: 6       Patients Stated Pain Goal: 3 ([id number])  Complications: No apparent anesthesia complications

## 2019-09-08 NOTE — Discharge Instructions (Addendum)
You were evaluated in the Emergency Department and after careful evaluation, we did not find any emergent condition requiring admission or further testing in the hospital.  Please follow-up with your outpatient psychiatrist as well as your orthopedic surgeon.  Please return to the Emergency Department if you experience any worsening of your condition.  We encourage you to follow up with a primary care provider.  Thank you for allowing [place] to be a part of your care.

## 2019-09-08 NOTE — ED Triage Notes (Signed)
Pt came from [facility], EMS was called after pt made SI-like comments to personnel there after "humeral fracture open reduction internal fixation". Pt had been under the influence of Versed and is currently not expressing any SI. All VSS with EMS, CBG 184

## 2019-09-08 NOTE — Anesthesia Preprocedure Evaluation (Addendum)
Anesthesia Evaluation  Patient identified by MRN, date of birth, ID band Patient awake    Reviewed: Allergy & Precautions, NPO status , Patient's Chart, lab work & pertinent test results  History of Anesthesia Complications Negative for: history of anesthetic complications  Airway Mallampati: II  TM Distance: >3 FB Neck ROM: Full    Dental no notable dental hx.    Pulmonary former smoker,    Pulmonary exam normal        Cardiovascular negative cardio ROS Normal cardiovascular exam     Neuro/Psych  Headaches, Anxiety Depression negative psych ROS   GI/Hepatic Neg liver ROS, GERD  Controlled,  Endo/Other  negative endocrine ROS  Renal/GU negative Renal ROS  negative genitourinary   Musculoskeletal  (+) Arthritis , Right proximal humerus fracture   Abdominal   Peds  Hematology negative hematology ROS (+)   Anesthesia Other Findings Day of surgery medications reviewed with patient.  Reproductive/Obstetrics negative OB ROS                            Anesthesia Physical Anesthesia Plan  ASA: II  Anesthesia Plan: General   Post-op Pain Management: GA combined w/ Regional for post-op pain   Induction: Intravenous  PONV Risk Score and Plan: 3 and Treatment may vary due to age or medical condition, Ondansetron, Midazolam and Dexamethasone  Airway Management Planned: Oral ETT  Additional Equipment: None  Intra-op Plan:   Post-operative Plan: Extubation in OR  Informed Consent: I have reviewed the patients History and Physical, chart, labs and discussed the procedure including the risks, benefits and alternatives for the proposed anesthesia with the patient or authorized representative who has indicated his/her understanding and acceptance.     Dental advisory given  Plan Discussed with: CRNA  Anesthesia Plan Comments:        Anesthesia Quick Evaluation

## 2019-09-08 NOTE — Anesthesia Postprocedure Evaluation (Signed)
Anesthesia Post Note  Patient: Caitrin Pufahl  Procedure(s) Performed: OPEN REDUCTION INTERNAL FIXATION (ORIF) HUMERAL FRACTURE (Right Shoulder)     Patient location during evaluation: PACU Anesthesia Type: General Level of consciousness: awake and alert and oriented Pain management: pain level controlled Vital Signs Assessment: post-procedure vital signs reviewed and stable Respiratory status: spontaneous breathing, nonlabored ventilation and respiratory function stable Cardiovascular status: blood pressure returned to baseline Postop Assessment: no apparent nausea or vomiting Anesthetic complications: no    Last Vitals:  Vitals:   09/09/2019 1157 09/10/2019 1416  BP:  112/65  Pulse: 75 87  Resp: 11 14  Temp:  36.6 C  SpO2: 100% 100%    Last Pain:  Vitals:   09/12/2019 1416  TempSrc:   PainSc: 0-No pain                 Brennan Bailey

## 2019-09-08 NOTE — Progress Notes (Addendum)
Patient alert and oriented x 4 in PACU. Patient crying and stating "My sister and ex-husband don't care about me or if I die" and "no one loves me or wants me". Patient also started making comments with suicidal ideations approximately 30 minutes after arrival to PACU and continued making these comments throughout the next hour and 15 minutes. Patient stated "I am going to starve myself to death", "I hope I throw an embolus so that I die", and "go get a gun so that you can shoot me so that I die".  Patient kept in sight at all times, with bed in low position, side-rails up, and bed wheels locked. This RN spoke to charge [employer].

## 2019-09-08 NOTE — Anesthesia Procedure Notes (Signed)
Anesthesia Regional Block: Interscalene brachial plexus block   Pre-Anesthetic Checklist: ,, timeout performed, Correct Patient, Correct Site, Correct Laterality, Correct Procedure, Correct Position, site marked, Risks and benefits discussed, pre-op evaluation,  At surgeon's request and post-op pain management  Laterality: Right  Prep: Maximum Sterile Barrier Precautions used, chloraprep       Needles:  Injection technique: Single-shot  Needle Type: Echogenic Stimulator Needle     Needle Length: 9cm  Needle Gauge: 22     Additional Needles:   Procedures:,,,, ultrasound used (permanent image in chart),,,,  Narrative:  Start time: 09/05/2019 11:51 AM End time: 09/14/2019 11:54 AM Injection made incrementally with aspirations every 5 mL.  Performed by: Personally  Anesthesiologist: Brennan Bailey, MD  Additional Notes: Risks, benefits, and alternative discussed. Patient gave consent for procedure. Patient prepped and draped in sterile fashion. Sedation administered, patient remains easily responsive to voice. Relevant anatomy identified with ultrasound guidance. Local anesthetic given in 5cc increments with no signs or symptoms of intravascular injection. No pain or paraesthesias with injection. Patient monitored throughout procedure with signs of LAST or immediate complications. Tolerated well. Ultrasound image placed in chart.  Tawny Asal, MD

## 2019-09-08 NOTE — Progress Notes (Signed)
Pt made statements reflecting suicidal thoughts to nurse caring for her in PACU. Based on those statements and patietn's psychiatric history that includes previous suicide attempts, deciaion made after speaking to perioperative leadership, that patient required transport for evaluation and safe environment of care. I informed patient of the need to transfer to the ED for appropriate evaluation. Patient became very upset, screaming and refusing transport, making kicking motions at staff. Refusing transport. I called charge nurse at [facility] ED to make aware of patient situation and pending transfer. EMS called for transport, [employer] in attendance for patient management and safety.Paramedic able to convince patient to transport calmly, but patient remained upset and angry at transfer. All belongings Health and safety inspector and belongings bag) transferred with patient via EMS.

## 2019-09-08 NOTE — Anesthesia Procedure Notes (Signed)
Procedure Name: Intubation Date/Time: 09/13/2019 1:00 PM Performed by: Bufford Spikes, CRNA Pre-anesthesia Checklist: Patient identified, Emergency Drugs available, Suction available and Patient being monitored Patient Re-evaluated:Patient Re-evaluated prior to induction Oxygen Delivery Method: Circle system utilized Preoxygenation: Pre-oxygenation with 100% oxygen Induction Type: IV induction Ventilation: Mask ventilation without difficulty Laryngoscope Size: Miller and 2 Grade View: Grade I Tube type: Oral Tube size: 7.0 mm Number of attempts: 1 Airway Equipment and Method: Stylet and Oral airway Placement Confirmation: ETT inserted through vocal cords under direct vision,  positive ETCO2 and breath sounds checked- equal and bilateral Secured at: 20 cm Tube secured with: Tape Dental Injury: Teeth and Oropharynx as per pre-operative assessment

## 2019-09-08 NOTE — Progress Notes (Signed)
Assisted Dr. Howze with right, ultrasound guided, interscalene  block. Side rails up, monitors on throughout procedure. See vital signs in flow sheet. Tolerated Procedure well. °

## 2019-09-09 NOTE — Addendum Note (Signed)
Addendum  created 09/09/19 1059 by Tawni Millers, CRNA   Charge Capture section accepted, Visit diagnoses modified

## 2019-09-09 NOTE — Op Note (Signed)
09/02/2019  1:56 PM  PATIENT:  [name]    PRE-OPERATIVE DIAGNOSIS:  RIGHT PROXIMAL HUMERUS FRACTURE  POST-OPERATIVE DIAGNOSIS:  Same  PROCEDURE:  OPEN REDUCTION INTERNAL FIXATION (ORIF) HUMERAL FRACTURE  SURGEON:  Renette Butters, MD  PHYSICIAN ASSISTANT: Roxan Hockey, PA-C, he was present and scrubbed throughout the case, critical for completion in a timely fashion, and for retraction, instrumentation, and closure.   ANESTHESIA:   General  PREOPERATIVE INDICATIONS:  [name] is a  63 y.o. female with a diagnosis of RIGHT PROXIMAL HUMERUS FRACTURE who elected for surgical management.    The risks benefits and alternatives were discussed with the patient including but not limited to the risks of nonoperative treatment, versus surgical intervention including infection, bleeding, nerve injury, malunion, nonunion, the need for revision surgery, hardware prominence, hardware failure, the need for hardware removal, blood clots, cardiopulmonary complications, conversion to arthroplasty, morbidity, mortality, among others, and they were willing to proceed.  Predicted outcome is good, although there will be at least a six to nine month expected recovery.    OPERATIVE IMPLANTS: Biomet S3 locking plate  OPERATIVE FINDINGS: Displaced proximal humerus fracture  OPERATIVE PROCEDURE: The patient was brought to the operating room and placed in the supine position. General anesthesia was administered. IV antibiotics were given. She was placed in the beach chair position. All bony prominences were padded. The upper extremity was prepped and draped in usual sterile fashion. Deltopectoral incision was performed.  I exposed the fracture site, and placed deep retractors. I did not tenotomize the biceps tendon. This was left in place. I elevated a small portion of the deltoid off of the shaft, in order to gain access for the plate. I placed supraspinatus and subscapularis stitches, and  then reduced the head onto the shaft. This was maintained in satisfactory position.  I applied the plate and secured it into the sliding hole first. I confirmed position of the reduction and the plate with C-arm, and I placed a total of 2 guidewires into the appropriate position in the head. I was satisfied that the plate was distal appropriately, and then secured the plate proximally with smooth pegs, taking care to prevent penetration into the arch articular surface, using C-arm, as well as manual feel using a hand drill.  I then secured the plate distally using another cortical screw. Once complete fixation and reduction of been achieved, took final C-arm pictures, and irrigated the wounds copiously, and repaired the deltopectoral interval with Vicryl followed by Vicryl for the subcutaneous tissue with Monocryl and Steri-Strips for the skin. She was placed in a sling. She had a preoperative regional block as well. She tolerated the procedure well with no complications.   POST OPERATIVE PLAN: Sling full time, DVT px: Ambulation and foot pumps

## 2019-09-09 NOTE — ED Notes (Signed)
Pt very agitated as RN attempted to introduce self.  RN was able to verbally calm pt down however she refused to allow discharge vitals.  RN removed IV and assisted pt in becoming dressed.  RN took pt out, waited until family arrived and assisted her into the car.

## 2019-09-10 ENCOUNTER — Encounter (HOSPITAL_BASED_OUTPATIENT_CLINIC_OR_DEPARTMENT_OTHER): Payer: Self-pay | Admitting: Orthopedic Surgery

## 2019-09-10 DIAGNOSIS — Z96641 Presence of right artificial hip joint: Secondary | ICD-10-CM | POA: Diagnosis not present

## 2019-09-10 DIAGNOSIS — S42211D Unspecified displaced fracture of surgical neck of right humerus, subsequent encounter for fracture with routine healing: Secondary | ICD-10-CM | POA: Diagnosis not present

## 2019-09-10 DIAGNOSIS — F329 Major depressive disorder, single episode, unspecified: Secondary | ICD-10-CM | POA: Diagnosis not present

## 2019-09-10 DIAGNOSIS — F609 Personality disorder, unspecified: Secondary | ICD-10-CM | POA: Diagnosis not present

## 2019-09-10 DIAGNOSIS — F419 Anxiety disorder, unspecified: Secondary | ICD-10-CM | POA: Diagnosis not present

## 2019-09-10 DIAGNOSIS — M1991 Primary osteoarthritis, unspecified site: Secondary | ICD-10-CM | POA: Diagnosis not present

## 2019-09-10 DIAGNOSIS — K219 Gastro-esophageal reflux disease without esophagitis: Secondary | ICD-10-CM | POA: Diagnosis not present

## 2019-09-10 DIAGNOSIS — R45851 Suicidal ideations: Secondary | ICD-10-CM | POA: Diagnosis not present

## 2019-09-10 DIAGNOSIS — G43909 Migraine, unspecified, not intractable, without status migrainosus: Secondary | ICD-10-CM | POA: Diagnosis not present

## 2019-09-16 DIAGNOSIS — F329 Major depressive disorder, single episode, unspecified: Secondary | ICD-10-CM | POA: Diagnosis not present

## 2019-09-16 DIAGNOSIS — F609 Personality disorder, unspecified: Secondary | ICD-10-CM | POA: Diagnosis not present

## 2019-09-16 DIAGNOSIS — K219 Gastro-esophageal reflux disease without esophagitis: Secondary | ICD-10-CM | POA: Diagnosis not present

## 2019-09-16 DIAGNOSIS — Z96641 Presence of right artificial hip joint: Secondary | ICD-10-CM | POA: Diagnosis not present

## 2019-09-16 DIAGNOSIS — F419 Anxiety disorder, unspecified: Secondary | ICD-10-CM | POA: Diagnosis not present

## 2019-09-16 DIAGNOSIS — S42211D Unspecified displaced fracture of surgical neck of right humerus, subsequent encounter for fracture with routine healing: Secondary | ICD-10-CM | POA: Diagnosis not present

## 2019-09-16 DIAGNOSIS — G43909 Migraine, unspecified, not intractable, without status migrainosus: Secondary | ICD-10-CM | POA: Diagnosis not present

## 2019-09-16 DIAGNOSIS — M1991 Primary osteoarthritis, unspecified site: Secondary | ICD-10-CM | POA: Diagnosis not present

## 2019-09-16 DIAGNOSIS — R45851 Suicidal ideations: Secondary | ICD-10-CM | POA: Diagnosis not present

## 2019-09-18 DIAGNOSIS — F609 Personality disorder, unspecified: Secondary | ICD-10-CM | POA: Diagnosis not present

## 2019-09-18 DIAGNOSIS — S42211D Unspecified displaced fracture of surgical neck of right humerus, subsequent encounter for fracture with routine healing: Secondary | ICD-10-CM | POA: Diagnosis not present

## 2019-09-18 DIAGNOSIS — S42201D Unspecified fracture of upper end of right humerus, subsequent encounter for fracture with routine healing: Secondary | ICD-10-CM | POA: Diagnosis not present

## 2019-09-18 DIAGNOSIS — F329 Major depressive disorder, single episode, unspecified: Secondary | ICD-10-CM | POA: Diagnosis not present

## 2019-09-18 DIAGNOSIS — F419 Anxiety disorder, unspecified: Secondary | ICD-10-CM | POA: Diagnosis not present

## 2019-09-18 DIAGNOSIS — M1991 Primary osteoarthritis, unspecified site: Secondary | ICD-10-CM | POA: Diagnosis not present

## 2019-09-18 DIAGNOSIS — K219 Gastro-esophageal reflux disease without esophagitis: Secondary | ICD-10-CM | POA: Diagnosis not present

## 2019-09-18 DIAGNOSIS — G43909 Migraine, unspecified, not intractable, without status migrainosus: Secondary | ICD-10-CM | POA: Diagnosis not present

## 2019-09-18 DIAGNOSIS — Z96641 Presence of right artificial hip joint: Secondary | ICD-10-CM | POA: Diagnosis not present

## 2019-09-18 DIAGNOSIS — R45851 Suicidal ideations: Secondary | ICD-10-CM | POA: Diagnosis not present

## 2019-09-21 DIAGNOSIS — F609 Personality disorder, unspecified: Secondary | ICD-10-CM | POA: Diagnosis not present

## 2019-09-21 DIAGNOSIS — K219 Gastro-esophageal reflux disease without esophagitis: Secondary | ICD-10-CM | POA: Diagnosis not present

## 2019-09-21 DIAGNOSIS — F419 Anxiety disorder, unspecified: Secondary | ICD-10-CM | POA: Diagnosis not present

## 2019-09-21 DIAGNOSIS — M1991 Primary osteoarthritis, unspecified site: Secondary | ICD-10-CM | POA: Diagnosis not present

## 2019-09-21 DIAGNOSIS — S42211D Unspecified displaced fracture of surgical neck of right humerus, subsequent encounter for fracture with routine healing: Secondary | ICD-10-CM | POA: Diagnosis not present

## 2019-09-21 DIAGNOSIS — F329 Major depressive disorder, single episode, unspecified: Secondary | ICD-10-CM | POA: Diagnosis not present

## 2019-09-21 DIAGNOSIS — R45851 Suicidal ideations: Secondary | ICD-10-CM | POA: Diagnosis not present

## 2019-09-21 DIAGNOSIS — Z96641 Presence of right artificial hip joint: Secondary | ICD-10-CM | POA: Diagnosis not present

## 2019-09-21 DIAGNOSIS — G43909 Migraine, unspecified, not intractable, without status migrainosus: Secondary | ICD-10-CM | POA: Diagnosis not present

## 2019-09-24 DIAGNOSIS — 419620001 Death: Secondary | SNOMED CT | POA: Diagnosis not present

## 2019-09-24 DEATH — deceased

## 2019-10-16 DIAGNOSIS — S42201D Unspecified fracture of upper end of right humerus, subsequent encounter for fracture with routine healing: Secondary | ICD-10-CM | POA: Diagnosis not present

## 2019-10-26 DIAGNOSIS — Z9181 History of falling: Secondary | ICD-10-CM | POA: Diagnosis not present

## 2019-10-26 DIAGNOSIS — S42211D Unspecified displaced fracture of surgical neck of right humerus, subsequent encounter for fracture with routine healing: Secondary | ICD-10-CM | POA: Diagnosis not present

## 2019-10-26 DIAGNOSIS — G43909 Migraine, unspecified, not intractable, without status migrainosus: Secondary | ICD-10-CM | POA: Diagnosis not present

## 2019-10-26 DIAGNOSIS — Z87891 Personal history of nicotine dependence: Secondary | ICD-10-CM | POA: Diagnosis not present

## 2019-10-26 DIAGNOSIS — K219 Gastro-esophageal reflux disease without esophagitis: Secondary | ICD-10-CM | POA: Diagnosis not present

## 2019-10-26 DIAGNOSIS — F419 Anxiety disorder, unspecified: Secondary | ICD-10-CM | POA: Diagnosis not present

## 2019-10-26 DIAGNOSIS — F329 Major depressive disorder, single episode, unspecified: Secondary | ICD-10-CM | POA: Diagnosis not present

## 2019-10-26 DIAGNOSIS — Z96641 Presence of right artificial hip joint: Secondary | ICD-10-CM | POA: Diagnosis not present

## 2019-10-26 DIAGNOSIS — F609 Personality disorder, unspecified: Secondary | ICD-10-CM | POA: Diagnosis not present

## 2019-10-29 DIAGNOSIS — G43909 Migraine, unspecified, not intractable, without status migrainosus: Secondary | ICD-10-CM | POA: Diagnosis not present

## 2019-10-29 DIAGNOSIS — Z96641 Presence of right artificial hip joint: Secondary | ICD-10-CM | POA: Diagnosis not present

## 2019-10-29 DIAGNOSIS — F419 Anxiety disorder, unspecified: Secondary | ICD-10-CM | POA: Diagnosis not present

## 2019-10-29 DIAGNOSIS — K219 Gastro-esophageal reflux disease without esophagitis: Secondary | ICD-10-CM | POA: Diagnosis not present

## 2019-10-29 DIAGNOSIS — Z87891 Personal history of nicotine dependence: Secondary | ICD-10-CM | POA: Diagnosis not present

## 2019-10-29 DIAGNOSIS — S42211D Unspecified displaced fracture of surgical neck of right humerus, subsequent encounter for fracture with routine healing: Secondary | ICD-10-CM | POA: Diagnosis not present

## 2019-10-29 DIAGNOSIS — Z9181 History of falling: Secondary | ICD-10-CM | POA: Diagnosis not present

## 2019-10-29 DIAGNOSIS — F329 Major depressive disorder, single episode, unspecified: Secondary | ICD-10-CM | POA: Diagnosis not present

## 2019-10-29 DIAGNOSIS — F609 Personality disorder, unspecified: Secondary | ICD-10-CM | POA: Diagnosis not present

## 2019-11-02 DIAGNOSIS — F609 Personality disorder, unspecified: Secondary | ICD-10-CM | POA: Diagnosis not present

## 2019-11-02 DIAGNOSIS — Z87891 Personal history of nicotine dependence: Secondary | ICD-10-CM | POA: Diagnosis not present

## 2019-11-02 DIAGNOSIS — F419 Anxiety disorder, unspecified: Secondary | ICD-10-CM | POA: Diagnosis not present

## 2019-11-02 DIAGNOSIS — K219 Gastro-esophageal reflux disease without esophagitis: Secondary | ICD-10-CM | POA: Diagnosis not present

## 2019-11-02 DIAGNOSIS — F329 Major depressive disorder, single episode, unspecified: Secondary | ICD-10-CM | POA: Diagnosis not present

## 2019-11-02 DIAGNOSIS — Z96641 Presence of right artificial hip joint: Secondary | ICD-10-CM | POA: Diagnosis not present

## 2019-11-02 DIAGNOSIS — G43909 Migraine, unspecified, not intractable, without status migrainosus: Secondary | ICD-10-CM | POA: Diagnosis not present

## 2019-11-02 DIAGNOSIS — Z9181 History of falling: Secondary | ICD-10-CM | POA: Diagnosis not present

## 2019-11-02 DIAGNOSIS — S42211D Unspecified displaced fracture of surgical neck of right humerus, subsequent encounter for fracture with routine healing: Secondary | ICD-10-CM | POA: Diagnosis not present

## 2019-11-04 DIAGNOSIS — F609 Personality disorder, unspecified: Secondary | ICD-10-CM | POA: Diagnosis not present

## 2019-11-04 DIAGNOSIS — Z9181 History of falling: Secondary | ICD-10-CM | POA: Diagnosis not present

## 2019-11-04 DIAGNOSIS — F419 Anxiety disorder, unspecified: Secondary | ICD-10-CM | POA: Diagnosis not present

## 2019-11-04 DIAGNOSIS — Z96641 Presence of right artificial hip joint: Secondary | ICD-10-CM | POA: Diagnosis not present

## 2019-11-04 DIAGNOSIS — S42211D Unspecified displaced fracture of surgical neck of right humerus, subsequent encounter for fracture with routine healing: Secondary | ICD-10-CM | POA: Diagnosis not present

## 2019-11-04 DIAGNOSIS — F329 Major depressive disorder, single episode, unspecified: Secondary | ICD-10-CM | POA: Diagnosis not present

## 2019-11-04 DIAGNOSIS — G43909 Migraine, unspecified, not intractable, without status migrainosus: Secondary | ICD-10-CM | POA: Diagnosis not present

## 2019-11-04 DIAGNOSIS — Z87891 Personal history of nicotine dependence: Secondary | ICD-10-CM | POA: Diagnosis not present

## 2019-11-04 DIAGNOSIS — K219 Gastro-esophageal reflux disease without esophagitis: Secondary | ICD-10-CM | POA: Diagnosis not present

## 2019-11-06 DIAGNOSIS — F609 Personality disorder, unspecified: Secondary | ICD-10-CM | POA: Diagnosis not present

## 2019-11-06 DIAGNOSIS — G43909 Migraine, unspecified, not intractable, without status migrainosus: Secondary | ICD-10-CM | POA: Diagnosis not present

## 2019-11-06 DIAGNOSIS — K219 Gastro-esophageal reflux disease without esophagitis: Secondary | ICD-10-CM | POA: Diagnosis not present

## 2019-11-06 DIAGNOSIS — S42211D Unspecified displaced fracture of surgical neck of right humerus, subsequent encounter for fracture with routine healing: Secondary | ICD-10-CM | POA: Diagnosis not present

## 2019-11-06 DIAGNOSIS — F419 Anxiety disorder, unspecified: Secondary | ICD-10-CM | POA: Diagnosis not present

## 2019-11-06 DIAGNOSIS — F329 Major depressive disorder, single episode, unspecified: Secondary | ICD-10-CM | POA: Diagnosis not present

## 2019-11-06 DIAGNOSIS — Z96641 Presence of right artificial hip joint: Secondary | ICD-10-CM | POA: Diagnosis not present

## 2019-11-06 DIAGNOSIS — Z9181 History of falling: Secondary | ICD-10-CM | POA: Diagnosis not present

## 2019-11-06 DIAGNOSIS — Z87891 Personal history of nicotine dependence: Secondary | ICD-10-CM | POA: Diagnosis not present

## 2019-11-09 DIAGNOSIS — F609 Personality disorder, unspecified: Secondary | ICD-10-CM | POA: Diagnosis not present

## 2019-11-09 DIAGNOSIS — K219 Gastro-esophageal reflux disease without esophagitis: Secondary | ICD-10-CM | POA: Diagnosis not present

## 2019-11-09 DIAGNOSIS — Z87891 Personal history of nicotine dependence: Secondary | ICD-10-CM | POA: Diagnosis not present

## 2019-11-09 DIAGNOSIS — F329 Major depressive disorder, single episode, unspecified: Secondary | ICD-10-CM | POA: Diagnosis not present

## 2019-11-09 DIAGNOSIS — F419 Anxiety disorder, unspecified: Secondary | ICD-10-CM | POA: Diagnosis not present

## 2019-11-09 DIAGNOSIS — G43909 Migraine, unspecified, not intractable, without status migrainosus: Secondary | ICD-10-CM | POA: Diagnosis not present

## 2019-11-09 DIAGNOSIS — Z96641 Presence of right artificial hip joint: Secondary | ICD-10-CM | POA: Diagnosis not present

## 2019-11-09 DIAGNOSIS — S42211D Unspecified displaced fracture of surgical neck of right humerus, subsequent encounter for fracture with routine healing: Secondary | ICD-10-CM | POA: Diagnosis not present

## 2019-11-09 DIAGNOSIS — Z9181 History of falling: Secondary | ICD-10-CM | POA: Diagnosis not present

## 2019-11-10 DIAGNOSIS — K219 Gastro-esophageal reflux disease without esophagitis: Secondary | ICD-10-CM | POA: Diagnosis not present

## 2019-11-10 DIAGNOSIS — F329 Major depressive disorder, single episode, unspecified: Secondary | ICD-10-CM | POA: Diagnosis not present

## 2019-11-10 DIAGNOSIS — F609 Personality disorder, unspecified: Secondary | ICD-10-CM | POA: Diagnosis not present

## 2019-11-10 DIAGNOSIS — S42211D Unspecified displaced fracture of surgical neck of right humerus, subsequent encounter for fracture with routine healing: Secondary | ICD-10-CM | POA: Diagnosis not present

## 2019-11-10 DIAGNOSIS — Z96641 Presence of right artificial hip joint: Secondary | ICD-10-CM | POA: Diagnosis not present

## 2019-11-10 DIAGNOSIS — Z87891 Personal history of nicotine dependence: Secondary | ICD-10-CM | POA: Diagnosis not present

## 2019-11-10 DIAGNOSIS — Z9181 History of falling: Secondary | ICD-10-CM | POA: Diagnosis not present

## 2019-11-10 DIAGNOSIS — F419 Anxiety disorder, unspecified: Secondary | ICD-10-CM | POA: Diagnosis not present

## 2019-11-10 DIAGNOSIS — G43909 Migraine, unspecified, not intractable, without status migrainosus: Secondary | ICD-10-CM | POA: Diagnosis not present

## 2019-11-11 DIAGNOSIS — G43909 Migraine, unspecified, not intractable, without status migrainosus: Secondary | ICD-10-CM | POA: Diagnosis not present

## 2019-11-11 DIAGNOSIS — F329 Major depressive disorder, single episode, unspecified: Secondary | ICD-10-CM | POA: Diagnosis not present

## 2019-11-11 DIAGNOSIS — F609 Personality disorder, unspecified: Secondary | ICD-10-CM | POA: Diagnosis not present

## 2019-11-11 DIAGNOSIS — Z87891 Personal history of nicotine dependence: Secondary | ICD-10-CM | POA: Diagnosis not present

## 2019-11-11 DIAGNOSIS — S42211D Unspecified displaced fracture of surgical neck of right humerus, subsequent encounter for fracture with routine healing: Secondary | ICD-10-CM | POA: Diagnosis not present

## 2019-11-11 DIAGNOSIS — F339 Major depressive disorder, recurrent, unspecified: Secondary | ICD-10-CM | POA: Diagnosis not present

## 2019-11-11 DIAGNOSIS — F419 Anxiety disorder, unspecified: Secondary | ICD-10-CM | POA: Diagnosis not present

## 2019-11-11 DIAGNOSIS — K219 Gastro-esophageal reflux disease without esophagitis: Secondary | ICD-10-CM | POA: Diagnosis not present

## 2019-11-11 DIAGNOSIS — E441 Mild protein-calorie malnutrition: Secondary | ICD-10-CM | POA: Diagnosis not present

## 2019-11-11 DIAGNOSIS — S42294D Other nondisplaced fracture of upper end of right humerus, subsequent encounter for fracture with routine healing: Secondary | ICD-10-CM | POA: Diagnosis not present

## 2019-11-11 DIAGNOSIS — F603 Borderline personality disorder: Secondary | ICD-10-CM | POA: Diagnosis not present

## 2019-11-11 DIAGNOSIS — Z9181 History of falling: Secondary | ICD-10-CM | POA: Diagnosis not present

## 2019-11-11 DIAGNOSIS — Z96641 Presence of right artificial hip joint: Secondary | ICD-10-CM | POA: Diagnosis not present

## 2019-11-13 DIAGNOSIS — S42211D Unspecified displaced fracture of surgical neck of right humerus, subsequent encounter for fracture with routine healing: Secondary | ICD-10-CM | POA: Diagnosis not present

## 2019-11-13 DIAGNOSIS — F609 Personality disorder, unspecified: Secondary | ICD-10-CM | POA: Diagnosis not present

## 2019-11-13 DIAGNOSIS — Z9181 History of falling: Secondary | ICD-10-CM | POA: Diagnosis not present

## 2019-11-13 DIAGNOSIS — Z96641 Presence of right artificial hip joint: Secondary | ICD-10-CM | POA: Diagnosis not present

## 2019-11-13 DIAGNOSIS — G43909 Migraine, unspecified, not intractable, without status migrainosus: Secondary | ICD-10-CM | POA: Diagnosis not present

## 2019-11-13 DIAGNOSIS — F329 Major depressive disorder, single episode, unspecified: Secondary | ICD-10-CM | POA: Diagnosis not present

## 2019-11-13 DIAGNOSIS — K219 Gastro-esophageal reflux disease without esophagitis: Secondary | ICD-10-CM | POA: Diagnosis not present

## 2019-11-13 DIAGNOSIS — S42201D Unspecified fracture of upper end of right humerus, subsequent encounter for fracture with routine healing: Secondary | ICD-10-CM | POA: Diagnosis not present

## 2019-11-13 DIAGNOSIS — Z87891 Personal history of nicotine dependence: Secondary | ICD-10-CM | POA: Diagnosis not present

## 2019-11-13 DIAGNOSIS — F419 Anxiety disorder, unspecified: Secondary | ICD-10-CM | POA: Diagnosis not present

## 2019-11-16 DIAGNOSIS — S42211D Unspecified displaced fracture of surgical neck of right humerus, subsequent encounter for fracture with routine healing: Secondary | ICD-10-CM | POA: Diagnosis not present

## 2019-11-16 DIAGNOSIS — Z9181 History of falling: Secondary | ICD-10-CM | POA: Diagnosis not present

## 2019-11-16 DIAGNOSIS — F329 Major depressive disorder, single episode, unspecified: Secondary | ICD-10-CM | POA: Diagnosis not present

## 2019-11-16 DIAGNOSIS — F609 Personality disorder, unspecified: Secondary | ICD-10-CM | POA: Diagnosis not present

## 2019-11-16 DIAGNOSIS — Z96641 Presence of right artificial hip joint: Secondary | ICD-10-CM | POA: Diagnosis not present

## 2019-11-16 DIAGNOSIS — G43909 Migraine, unspecified, not intractable, without status migrainosus: Secondary | ICD-10-CM | POA: Diagnosis not present

## 2019-11-16 DIAGNOSIS — Z87891 Personal history of nicotine dependence: Secondary | ICD-10-CM | POA: Diagnosis not present

## 2019-11-16 DIAGNOSIS — K219 Gastro-esophageal reflux disease without esophagitis: Secondary | ICD-10-CM | POA: Diagnosis not present

## 2019-11-16 DIAGNOSIS — F419 Anxiety disorder, unspecified: Secondary | ICD-10-CM | POA: Diagnosis not present

## 2019-11-18 DIAGNOSIS — F329 Major depressive disorder, single episode, unspecified: Secondary | ICD-10-CM | POA: Diagnosis not present

## 2019-11-18 DIAGNOSIS — F419 Anxiety disorder, unspecified: Secondary | ICD-10-CM | POA: Diagnosis not present

## 2019-11-18 DIAGNOSIS — Z96641 Presence of right artificial hip joint: Secondary | ICD-10-CM | POA: Diagnosis not present

## 2019-11-18 DIAGNOSIS — K219 Gastro-esophageal reflux disease without esophagitis: Secondary | ICD-10-CM | POA: Diagnosis not present

## 2019-11-18 DIAGNOSIS — G43909 Migraine, unspecified, not intractable, without status migrainosus: Secondary | ICD-10-CM | POA: Diagnosis not present

## 2019-11-18 DIAGNOSIS — S42211D Unspecified displaced fracture of surgical neck of right humerus, subsequent encounter for fracture with routine healing: Secondary | ICD-10-CM | POA: Diagnosis not present

## 2019-11-18 DIAGNOSIS — Z9181 History of falling: Secondary | ICD-10-CM | POA: Diagnosis not present

## 2019-11-18 DIAGNOSIS — F609 Personality disorder, unspecified: Secondary | ICD-10-CM | POA: Diagnosis not present

## 2019-11-18 DIAGNOSIS — Z87891 Personal history of nicotine dependence: Secondary | ICD-10-CM | POA: Diagnosis not present

## 2019-11-23 DIAGNOSIS — S42211D Unspecified displaced fracture of surgical neck of right humerus, subsequent encounter for fracture with routine healing: Secondary | ICD-10-CM | POA: Diagnosis not present

## 2019-11-23 DIAGNOSIS — Z9181 History of falling: Secondary | ICD-10-CM | POA: Diagnosis not present

## 2019-11-23 DIAGNOSIS — F419 Anxiety disorder, unspecified: Secondary | ICD-10-CM | POA: Diagnosis not present

## 2019-11-23 DIAGNOSIS — F609 Personality disorder, unspecified: Secondary | ICD-10-CM | POA: Diagnosis not present

## 2019-11-23 DIAGNOSIS — F329 Major depressive disorder, single episode, unspecified: Secondary | ICD-10-CM | POA: Diagnosis not present

## 2019-11-23 DIAGNOSIS — Z87891 Personal history of nicotine dependence: Secondary | ICD-10-CM | POA: Diagnosis not present

## 2019-11-23 DIAGNOSIS — K219 Gastro-esophageal reflux disease without esophagitis: Secondary | ICD-10-CM | POA: Diagnosis not present

## 2019-11-23 DIAGNOSIS — G43909 Migraine, unspecified, not intractable, without status migrainosus: Secondary | ICD-10-CM | POA: Diagnosis not present

## 2019-11-23 DIAGNOSIS — Z96641 Presence of right artificial hip joint: Secondary | ICD-10-CM | POA: Diagnosis not present

## 2019-11-25 DIAGNOSIS — Z87891 Personal history of nicotine dependence: Secondary | ICD-10-CM | POA: Diagnosis not present

## 2019-11-25 DIAGNOSIS — F419 Anxiety disorder, unspecified: Secondary | ICD-10-CM | POA: Diagnosis not present

## 2019-11-25 DIAGNOSIS — Z9181 History of falling: Secondary | ICD-10-CM | POA: Diagnosis not present

## 2019-11-25 DIAGNOSIS — F329 Major depressive disorder, single episode, unspecified: Secondary | ICD-10-CM | POA: Diagnosis not present

## 2019-11-25 DIAGNOSIS — F609 Personality disorder, unspecified: Secondary | ICD-10-CM | POA: Diagnosis not present

## 2019-11-25 DIAGNOSIS — S42211D Unspecified displaced fracture of surgical neck of right humerus, subsequent encounter for fracture with routine healing: Secondary | ICD-10-CM | POA: Diagnosis not present

## 2019-11-25 DIAGNOSIS — K219 Gastro-esophageal reflux disease without esophagitis: Secondary | ICD-10-CM | POA: Diagnosis not present

## 2019-11-25 DIAGNOSIS — Z96641 Presence of right artificial hip joint: Secondary | ICD-10-CM | POA: Diagnosis not present

## 2019-11-25 DIAGNOSIS — G43909 Migraine, unspecified, not intractable, without status migrainosus: Secondary | ICD-10-CM | POA: Diagnosis not present

## 2019-11-30 DIAGNOSIS — F609 Personality disorder, unspecified: Secondary | ICD-10-CM | POA: Diagnosis not present

## 2019-11-30 DIAGNOSIS — S42211D Unspecified displaced fracture of surgical neck of right humerus, subsequent encounter for fracture with routine healing: Secondary | ICD-10-CM | POA: Diagnosis not present

## 2019-11-30 DIAGNOSIS — Z87891 Personal history of nicotine dependence: Secondary | ICD-10-CM | POA: Diagnosis not present

## 2019-11-30 DIAGNOSIS — F419 Anxiety disorder, unspecified: Secondary | ICD-10-CM | POA: Diagnosis not present

## 2019-11-30 DIAGNOSIS — F329 Major depressive disorder, single episode, unspecified: Secondary | ICD-10-CM | POA: Diagnosis not present

## 2019-11-30 DIAGNOSIS — K219 Gastro-esophageal reflux disease without esophagitis: Secondary | ICD-10-CM | POA: Diagnosis not present

## 2019-11-30 DIAGNOSIS — Z9181 History of falling: Secondary | ICD-10-CM | POA: Diagnosis not present

## 2019-11-30 DIAGNOSIS — G43909 Migraine, unspecified, not intractable, without status migrainosus: Secondary | ICD-10-CM | POA: Diagnosis not present

## 2019-11-30 DIAGNOSIS — Z96641 Presence of right artificial hip joint: Secondary | ICD-10-CM | POA: Diagnosis not present

## 2019-12-02 DIAGNOSIS — Z96641 Presence of right artificial hip joint: Secondary | ICD-10-CM | POA: Diagnosis not present

## 2019-12-02 DIAGNOSIS — G43909 Migraine, unspecified, not intractable, without status migrainosus: Secondary | ICD-10-CM | POA: Diagnosis not present

## 2019-12-02 DIAGNOSIS — S42211D Unspecified displaced fracture of surgical neck of right humerus, subsequent encounter for fracture with routine healing: Secondary | ICD-10-CM | POA: Diagnosis not present

## 2019-12-02 DIAGNOSIS — F609 Personality disorder, unspecified: Secondary | ICD-10-CM | POA: Diagnosis not present

## 2019-12-02 DIAGNOSIS — Z87891 Personal history of nicotine dependence: Secondary | ICD-10-CM | POA: Diagnosis not present

## 2019-12-02 DIAGNOSIS — Z9181 History of falling: Secondary | ICD-10-CM | POA: Diagnosis not present

## 2019-12-02 DIAGNOSIS — F419 Anxiety disorder, unspecified: Secondary | ICD-10-CM | POA: Diagnosis not present

## 2019-12-02 DIAGNOSIS — F329 Major depressive disorder, single episode, unspecified: Secondary | ICD-10-CM | POA: Diagnosis not present

## 2019-12-02 DIAGNOSIS — K219 Gastro-esophageal reflux disease without esophagitis: Secondary | ICD-10-CM | POA: Diagnosis not present

## 2019-12-08 DIAGNOSIS — Z96641 Presence of right artificial hip joint: Secondary | ICD-10-CM | POA: Diagnosis not present

## 2019-12-08 DIAGNOSIS — G43909 Migraine, unspecified, not intractable, without status migrainosus: Secondary | ICD-10-CM | POA: Diagnosis not present

## 2019-12-08 DIAGNOSIS — F609 Personality disorder, unspecified: Secondary | ICD-10-CM | POA: Diagnosis not present

## 2019-12-08 DIAGNOSIS — F419 Anxiety disorder, unspecified: Secondary | ICD-10-CM | POA: Diagnosis not present

## 2019-12-08 DIAGNOSIS — Z87891 Personal history of nicotine dependence: Secondary | ICD-10-CM | POA: Diagnosis not present

## 2019-12-08 DIAGNOSIS — K219 Gastro-esophageal reflux disease without esophagitis: Secondary | ICD-10-CM | POA: Diagnosis not present

## 2019-12-08 DIAGNOSIS — S42211D Unspecified displaced fracture of surgical neck of right humerus, subsequent encounter for fracture with routine healing: Secondary | ICD-10-CM | POA: Diagnosis not present

## 2019-12-08 DIAGNOSIS — F329 Major depressive disorder, single episode, unspecified: Secondary | ICD-10-CM | POA: Diagnosis not present

## 2019-12-08 DIAGNOSIS — Z9181 History of falling: Secondary | ICD-10-CM | POA: Diagnosis not present

## 2019-12-10 DIAGNOSIS — G43909 Migraine, unspecified, not intractable, without status migrainosus: Secondary | ICD-10-CM | POA: Diagnosis not present

## 2019-12-10 DIAGNOSIS — F329 Major depressive disorder, single episode, unspecified: Secondary | ICD-10-CM | POA: Diagnosis not present

## 2019-12-10 DIAGNOSIS — Z9181 History of falling: Secondary | ICD-10-CM | POA: Diagnosis not present

## 2019-12-10 DIAGNOSIS — Z96641 Presence of right artificial hip joint: Secondary | ICD-10-CM | POA: Diagnosis not present

## 2019-12-10 DIAGNOSIS — K219 Gastro-esophageal reflux disease without esophagitis: Secondary | ICD-10-CM | POA: Diagnosis not present

## 2019-12-10 DIAGNOSIS — Z87891 Personal history of nicotine dependence: Secondary | ICD-10-CM | POA: Diagnosis not present

## 2019-12-10 DIAGNOSIS — S42211D Unspecified displaced fracture of surgical neck of right humerus, subsequent encounter for fracture with routine healing: Secondary | ICD-10-CM | POA: Diagnosis not present

## 2019-12-10 DIAGNOSIS — F419 Anxiety disorder, unspecified: Secondary | ICD-10-CM | POA: Diagnosis not present

## 2019-12-10 DIAGNOSIS — F609 Personality disorder, unspecified: Secondary | ICD-10-CM | POA: Diagnosis not present

## 2019-12-14 DIAGNOSIS — Z87891 Personal history of nicotine dependence: Secondary | ICD-10-CM | POA: Diagnosis not present

## 2019-12-14 DIAGNOSIS — F329 Major depressive disorder, single episode, unspecified: Secondary | ICD-10-CM | POA: Diagnosis not present

## 2019-12-14 DIAGNOSIS — G43909 Migraine, unspecified, not intractable, without status migrainosus: Secondary | ICD-10-CM | POA: Diagnosis not present

## 2019-12-14 DIAGNOSIS — Z9181 History of falling: Secondary | ICD-10-CM | POA: Diagnosis not present

## 2019-12-14 DIAGNOSIS — F419 Anxiety disorder, unspecified: Secondary | ICD-10-CM | POA: Diagnosis not present

## 2019-12-14 DIAGNOSIS — S42211D Unspecified displaced fracture of surgical neck of right humerus, subsequent encounter for fracture with routine healing: Secondary | ICD-10-CM | POA: Diagnosis not present

## 2019-12-14 DIAGNOSIS — K219 Gastro-esophageal reflux disease without esophagitis: Secondary | ICD-10-CM | POA: Diagnosis not present

## 2019-12-14 DIAGNOSIS — F609 Personality disorder, unspecified: Secondary | ICD-10-CM | POA: Diagnosis not present

## 2019-12-14 DIAGNOSIS — Z96641 Presence of right artificial hip joint: Secondary | ICD-10-CM | POA: Diagnosis not present

## 2019-12-16 DIAGNOSIS — S42211D Unspecified displaced fracture of surgical neck of right humerus, subsequent encounter for fracture with routine healing: Secondary | ICD-10-CM | POA: Diagnosis not present

## 2019-12-16 DIAGNOSIS — Z87891 Personal history of nicotine dependence: Secondary | ICD-10-CM | POA: Diagnosis not present

## 2019-12-16 DIAGNOSIS — F609 Personality disorder, unspecified: Secondary | ICD-10-CM | POA: Diagnosis not present

## 2019-12-16 DIAGNOSIS — F329 Major depressive disorder, single episode, unspecified: Secondary | ICD-10-CM | POA: Diagnosis not present

## 2019-12-16 DIAGNOSIS — Z96641 Presence of right artificial hip joint: Secondary | ICD-10-CM | POA: Diagnosis not present

## 2019-12-16 DIAGNOSIS — K219 Gastro-esophageal reflux disease without esophagitis: Secondary | ICD-10-CM | POA: Diagnosis not present

## 2019-12-16 DIAGNOSIS — F419 Anxiety disorder, unspecified: Secondary | ICD-10-CM | POA: Diagnosis not present

## 2019-12-16 DIAGNOSIS — Z9181 History of falling: Secondary | ICD-10-CM | POA: Diagnosis not present

## 2019-12-16 DIAGNOSIS — G43909 Migraine, unspecified, not intractable, without status migrainosus: Secondary | ICD-10-CM | POA: Diagnosis not present

## 2019-12-22 DIAGNOSIS — F609 Personality disorder, unspecified: Secondary | ICD-10-CM | POA: Diagnosis not present

## 2019-12-22 DIAGNOSIS — G43909 Migraine, unspecified, not intractable, without status migrainosus: Secondary | ICD-10-CM | POA: Diagnosis not present

## 2019-12-22 DIAGNOSIS — F419 Anxiety disorder, unspecified: Secondary | ICD-10-CM | POA: Diagnosis not present

## 2019-12-22 DIAGNOSIS — Z9181 History of falling: Secondary | ICD-10-CM | POA: Diagnosis not present

## 2019-12-22 DIAGNOSIS — S42211D Unspecified displaced fracture of surgical neck of right humerus, subsequent encounter for fracture with routine healing: Secondary | ICD-10-CM | POA: Diagnosis not present

## 2019-12-22 DIAGNOSIS — F329 Major depressive disorder, single episode, unspecified: Secondary | ICD-10-CM | POA: Diagnosis not present

## 2019-12-22 DIAGNOSIS — Z96641 Presence of right artificial hip joint: Secondary | ICD-10-CM | POA: Diagnosis not present

## 2019-12-22 DIAGNOSIS — K219 Gastro-esophageal reflux disease without esophagitis: Secondary | ICD-10-CM | POA: Diagnosis not present

## 2019-12-22 DIAGNOSIS — Z87891 Personal history of nicotine dependence: Secondary | ICD-10-CM | POA: Diagnosis not present

## 2019-12-25 DIAGNOSIS — S42211D Unspecified displaced fracture of surgical neck of right humerus, subsequent encounter for fracture with routine healing: Secondary | ICD-10-CM | POA: Diagnosis not present

## 2019-12-25 DIAGNOSIS — F419 Anxiety disorder, unspecified: Secondary | ICD-10-CM | POA: Diagnosis not present

## 2019-12-25 DIAGNOSIS — F329 Major depressive disorder, single episode, unspecified: Secondary | ICD-10-CM | POA: Diagnosis not present

## 2019-12-25 DIAGNOSIS — Z96641 Presence of right artificial hip joint: Secondary | ICD-10-CM | POA: Diagnosis not present

## 2019-12-25 DIAGNOSIS — K219 Gastro-esophageal reflux disease without esophagitis: Secondary | ICD-10-CM | POA: Diagnosis not present

## 2019-12-25 DIAGNOSIS — Z87891 Personal history of nicotine dependence: Secondary | ICD-10-CM | POA: Diagnosis not present

## 2019-12-25 DIAGNOSIS — Z9181 History of falling: Secondary | ICD-10-CM | POA: Diagnosis not present

## 2019-12-25 DIAGNOSIS — G43909 Migraine, unspecified, not intractable, without status migrainosus: Secondary | ICD-10-CM | POA: Diagnosis not present

## 2019-12-25 DIAGNOSIS — F609 Personality disorder, unspecified: Secondary | ICD-10-CM | POA: Diagnosis not present

## 2019-12-28 DIAGNOSIS — G43909 Migraine, unspecified, not intractable, without status migrainosus: Secondary | ICD-10-CM | POA: Diagnosis not present

## 2019-12-28 DIAGNOSIS — F609 Personality disorder, unspecified: Secondary | ICD-10-CM | POA: Diagnosis not present

## 2019-12-28 DIAGNOSIS — Z9181 History of falling: Secondary | ICD-10-CM | POA: Diagnosis not present

## 2019-12-28 DIAGNOSIS — S42211D Unspecified displaced fracture of surgical neck of right humerus, subsequent encounter for fracture with routine healing: Secondary | ICD-10-CM | POA: Diagnosis not present

## 2019-12-28 DIAGNOSIS — Z87891 Personal history of nicotine dependence: Secondary | ICD-10-CM | POA: Diagnosis not present

## 2019-12-28 DIAGNOSIS — K219 Gastro-esophageal reflux disease without esophagitis: Secondary | ICD-10-CM | POA: Diagnosis not present

## 2019-12-28 DIAGNOSIS — F329 Major depressive disorder, single episode, unspecified: Secondary | ICD-10-CM | POA: Diagnosis not present

## 2019-12-28 DIAGNOSIS — Z96641 Presence of right artificial hip joint: Secondary | ICD-10-CM | POA: Diagnosis not present

## 2019-12-28 DIAGNOSIS — F419 Anxiety disorder, unspecified: Secondary | ICD-10-CM | POA: Diagnosis not present

## 2019-12-30 DIAGNOSIS — F419 Anxiety disorder, unspecified: Secondary | ICD-10-CM | POA: Diagnosis not present

## 2019-12-30 DIAGNOSIS — S42211D Unspecified displaced fracture of surgical neck of right humerus, subsequent encounter for fracture with routine healing: Secondary | ICD-10-CM | POA: Diagnosis not present

## 2019-12-30 DIAGNOSIS — F609 Personality disorder, unspecified: Secondary | ICD-10-CM | POA: Diagnosis not present

## 2019-12-30 DIAGNOSIS — Z9181 History of falling: Secondary | ICD-10-CM | POA: Diagnosis not present

## 2019-12-30 DIAGNOSIS — G43909 Migraine, unspecified, not intractable, without status migrainosus: Secondary | ICD-10-CM | POA: Diagnosis not present

## 2019-12-30 DIAGNOSIS — F329 Major depressive disorder, single episode, unspecified: Secondary | ICD-10-CM | POA: Diagnosis not present

## 2019-12-30 DIAGNOSIS — Z87891 Personal history of nicotine dependence: Secondary | ICD-10-CM | POA: Diagnosis not present

## 2019-12-30 DIAGNOSIS — Z96641 Presence of right artificial hip joint: Secondary | ICD-10-CM | POA: Diagnosis not present

## 2019-12-30 DIAGNOSIS — K219 Gastro-esophageal reflux disease without esophagitis: Secondary | ICD-10-CM | POA: Diagnosis not present

## 2020-01-04 DIAGNOSIS — G43909 Migraine, unspecified, not intractable, without status migrainosus: Secondary | ICD-10-CM | POA: Diagnosis not present

## 2020-01-04 DIAGNOSIS — Z96641 Presence of right artificial hip joint: Secondary | ICD-10-CM | POA: Diagnosis not present

## 2020-01-04 DIAGNOSIS — Z87891 Personal history of nicotine dependence: Secondary | ICD-10-CM | POA: Diagnosis not present

## 2020-01-04 DIAGNOSIS — K219 Gastro-esophageal reflux disease without esophagitis: Secondary | ICD-10-CM | POA: Diagnosis not present

## 2020-01-04 DIAGNOSIS — S42211D Unspecified displaced fracture of surgical neck of right humerus, subsequent encounter for fracture with routine healing: Secondary | ICD-10-CM | POA: Diagnosis not present

## 2020-01-04 DIAGNOSIS — Z9181 History of falling: Secondary | ICD-10-CM | POA: Diagnosis not present

## 2020-01-04 DIAGNOSIS — F609 Personality disorder, unspecified: Secondary | ICD-10-CM | POA: Diagnosis not present

## 2020-01-04 DIAGNOSIS — F419 Anxiety disorder, unspecified: Secondary | ICD-10-CM | POA: Diagnosis not present

## 2020-01-04 DIAGNOSIS — F329 Major depressive disorder, single episode, unspecified: Secondary | ICD-10-CM | POA: Diagnosis not present

## 2020-01-06 DIAGNOSIS — G43909 Migraine, unspecified, not intractable, without status migrainosus: Secondary | ICD-10-CM | POA: Diagnosis not present

## 2020-01-06 DIAGNOSIS — Z96641 Presence of right artificial hip joint: Secondary | ICD-10-CM | POA: Diagnosis not present

## 2020-01-06 DIAGNOSIS — S42211D Unspecified displaced fracture of surgical neck of right humerus, subsequent encounter for fracture with routine healing: Secondary | ICD-10-CM | POA: Diagnosis not present

## 2020-01-06 DIAGNOSIS — F609 Personality disorder, unspecified: Secondary | ICD-10-CM | POA: Diagnosis not present

## 2020-01-06 DIAGNOSIS — K219 Gastro-esophageal reflux disease without esophagitis: Secondary | ICD-10-CM | POA: Diagnosis not present

## 2020-01-06 DIAGNOSIS — Z87891 Personal history of nicotine dependence: Secondary | ICD-10-CM | POA: Diagnosis not present

## 2020-01-06 DIAGNOSIS — F329 Major depressive disorder, single episode, unspecified: Secondary | ICD-10-CM | POA: Diagnosis not present

## 2020-01-06 DIAGNOSIS — F419 Anxiety disorder, unspecified: Secondary | ICD-10-CM | POA: Diagnosis not present

## 2020-01-06 DIAGNOSIS — Z9181 History of falling: Secondary | ICD-10-CM | POA: Diagnosis not present

## 2020-01-08 DIAGNOSIS — S42201D Unspecified fracture of upper end of right humerus, subsequent encounter for fracture with routine healing: Secondary | ICD-10-CM | POA: Diagnosis not present

## 2020-01-11 DIAGNOSIS — Z9181 History of falling: Secondary | ICD-10-CM | POA: Diagnosis not present

## 2020-01-11 DIAGNOSIS — Z87891 Personal history of nicotine dependence: Secondary | ICD-10-CM | POA: Diagnosis not present

## 2020-01-11 DIAGNOSIS — F329 Major depressive disorder, single episode, unspecified: Secondary | ICD-10-CM | POA: Diagnosis not present

## 2020-01-11 DIAGNOSIS — F609 Personality disorder, unspecified: Secondary | ICD-10-CM | POA: Diagnosis not present

## 2020-01-11 DIAGNOSIS — K219 Gastro-esophageal reflux disease without esophagitis: Secondary | ICD-10-CM | POA: Diagnosis not present

## 2020-01-11 DIAGNOSIS — S42211D Unspecified displaced fracture of surgical neck of right humerus, subsequent encounter for fracture with routine healing: Secondary | ICD-10-CM | POA: Diagnosis not present

## 2020-01-11 DIAGNOSIS — G43909 Migraine, unspecified, not intractable, without status migrainosus: Secondary | ICD-10-CM | POA: Diagnosis not present

## 2020-01-11 DIAGNOSIS — Z96641 Presence of right artificial hip joint: Secondary | ICD-10-CM | POA: Diagnosis not present

## 2020-01-11 DIAGNOSIS — F419 Anxiety disorder, unspecified: Secondary | ICD-10-CM | POA: Diagnosis not present

## 2020-01-13 DIAGNOSIS — G43909 Migraine, unspecified, not intractable, without status migrainosus: Secondary | ICD-10-CM | POA: Diagnosis not present

## 2020-01-13 DIAGNOSIS — Z9181 History of falling: Secondary | ICD-10-CM | POA: Diagnosis not present

## 2020-01-13 DIAGNOSIS — F609 Personality disorder, unspecified: Secondary | ICD-10-CM | POA: Diagnosis not present

## 2020-01-13 DIAGNOSIS — S42211D Unspecified displaced fracture of surgical neck of right humerus, subsequent encounter for fracture with routine healing: Secondary | ICD-10-CM | POA: Diagnosis not present

## 2020-01-13 DIAGNOSIS — F329 Major depressive disorder, single episode, unspecified: Secondary | ICD-10-CM | POA: Diagnosis not present

## 2020-01-13 DIAGNOSIS — K219 Gastro-esophageal reflux disease without esophagitis: Secondary | ICD-10-CM | POA: Diagnosis not present

## 2020-01-13 DIAGNOSIS — Z87891 Personal history of nicotine dependence: Secondary | ICD-10-CM | POA: Diagnosis not present

## 2020-01-13 DIAGNOSIS — Z96641 Presence of right artificial hip joint: Secondary | ICD-10-CM | POA: Diagnosis not present

## 2020-01-13 DIAGNOSIS — F419 Anxiety disorder, unspecified: Secondary | ICD-10-CM | POA: Diagnosis not present

## 2020-01-18 DIAGNOSIS — S42211D Unspecified displaced fracture of surgical neck of right humerus, subsequent encounter for fracture with routine healing: Secondary | ICD-10-CM | POA: Diagnosis not present

## 2020-01-18 DIAGNOSIS — K219 Gastro-esophageal reflux disease without esophagitis: Secondary | ICD-10-CM | POA: Diagnosis not present

## 2020-01-18 DIAGNOSIS — Z96641 Presence of right artificial hip joint: Secondary | ICD-10-CM | POA: Diagnosis not present

## 2020-01-18 DIAGNOSIS — Z87891 Personal history of nicotine dependence: Secondary | ICD-10-CM | POA: Diagnosis not present

## 2020-01-18 DIAGNOSIS — F419 Anxiety disorder, unspecified: Secondary | ICD-10-CM | POA: Diagnosis not present

## 2020-01-18 DIAGNOSIS — F329 Major depressive disorder, single episode, unspecified: Secondary | ICD-10-CM | POA: Diagnosis not present

## 2020-01-18 DIAGNOSIS — Z9181 History of falling: Secondary | ICD-10-CM | POA: Diagnosis not present

## 2020-01-18 DIAGNOSIS — G43909 Migraine, unspecified, not intractable, without status migrainosus: Secondary | ICD-10-CM | POA: Diagnosis not present

## 2020-01-18 DIAGNOSIS — F609 Personality disorder, unspecified: Secondary | ICD-10-CM | POA: Diagnosis not present

## 2020-01-20 DIAGNOSIS — F419 Anxiety disorder, unspecified: Secondary | ICD-10-CM | POA: Diagnosis not present

## 2020-01-20 DIAGNOSIS — Z87891 Personal history of nicotine dependence: Secondary | ICD-10-CM | POA: Diagnosis not present

## 2020-01-20 DIAGNOSIS — Z9181 History of falling: Secondary | ICD-10-CM | POA: Diagnosis not present

## 2020-01-20 DIAGNOSIS — F329 Major depressive disorder, single episode, unspecified: Secondary | ICD-10-CM | POA: Diagnosis not present

## 2020-01-20 DIAGNOSIS — Z96641 Presence of right artificial hip joint: Secondary | ICD-10-CM | POA: Diagnosis not present

## 2020-01-20 DIAGNOSIS — G43909 Migraine, unspecified, not intractable, without status migrainosus: Secondary | ICD-10-CM | POA: Diagnosis not present

## 2020-01-20 DIAGNOSIS — F609 Personality disorder, unspecified: Secondary | ICD-10-CM | POA: Diagnosis not present

## 2020-01-20 DIAGNOSIS — K219 Gastro-esophageal reflux disease without esophagitis: Secondary | ICD-10-CM | POA: Diagnosis not present

## 2020-01-20 DIAGNOSIS — S42211D Unspecified displaced fracture of surgical neck of right humerus, subsequent encounter for fracture with routine healing: Secondary | ICD-10-CM | POA: Diagnosis not present

## 2020-01-24 DIAGNOSIS — Z96641 Presence of right artificial hip joint: Secondary | ICD-10-CM | POA: Diagnosis not present

## 2020-01-24 DIAGNOSIS — K219 Gastro-esophageal reflux disease without esophagitis: Secondary | ICD-10-CM | POA: Diagnosis not present

## 2020-01-24 DIAGNOSIS — S42211D Unspecified displaced fracture of surgical neck of right humerus, subsequent encounter for fracture with routine healing: Secondary | ICD-10-CM | POA: Diagnosis not present

## 2020-01-24 DIAGNOSIS — F419 Anxiety disorder, unspecified: Secondary | ICD-10-CM | POA: Diagnosis not present

## 2020-01-24 DIAGNOSIS — F609 Personality disorder, unspecified: Secondary | ICD-10-CM | POA: Diagnosis not present

## 2020-01-24 DIAGNOSIS — Z87891 Personal history of nicotine dependence: Secondary | ICD-10-CM | POA: Diagnosis not present

## 2020-01-24 DIAGNOSIS — G43909 Migraine, unspecified, not intractable, without status migrainosus: Secondary | ICD-10-CM | POA: Diagnosis not present

## 2020-01-24 DIAGNOSIS — F329 Major depressive disorder, single episode, unspecified: Secondary | ICD-10-CM | POA: Diagnosis not present

## 2020-01-24 DIAGNOSIS — Z9181 History of falling: Secondary | ICD-10-CM | POA: Diagnosis not present

## 2020-01-27 DIAGNOSIS — Z9181 History of falling: Secondary | ICD-10-CM | POA: Diagnosis not present

## 2020-01-27 DIAGNOSIS — F419 Anxiety disorder, unspecified: Secondary | ICD-10-CM | POA: Diagnosis not present

## 2020-01-27 DIAGNOSIS — F609 Personality disorder, unspecified: Secondary | ICD-10-CM | POA: Diagnosis not present

## 2020-01-27 DIAGNOSIS — Z96641 Presence of right artificial hip joint: Secondary | ICD-10-CM | POA: Diagnosis not present

## 2020-01-27 DIAGNOSIS — G43909 Migraine, unspecified, not intractable, without status migrainosus: Secondary | ICD-10-CM | POA: Diagnosis not present

## 2020-01-27 DIAGNOSIS — F329 Major depressive disorder, single episode, unspecified: Secondary | ICD-10-CM | POA: Diagnosis not present

## 2020-01-27 DIAGNOSIS — Z87891 Personal history of nicotine dependence: Secondary | ICD-10-CM | POA: Diagnosis not present

## 2020-01-27 DIAGNOSIS — K219 Gastro-esophageal reflux disease without esophagitis: Secondary | ICD-10-CM | POA: Diagnosis not present

## 2020-01-27 DIAGNOSIS — S42211D Unspecified displaced fracture of surgical neck of right humerus, subsequent encounter for fracture with routine healing: Secondary | ICD-10-CM | POA: Diagnosis not present

## 2020-02-01 DIAGNOSIS — Z9181 History of falling: Secondary | ICD-10-CM | POA: Diagnosis not present

## 2020-02-01 DIAGNOSIS — F419 Anxiety disorder, unspecified: Secondary | ICD-10-CM | POA: Diagnosis not present

## 2020-02-01 DIAGNOSIS — Z87891 Personal history of nicotine dependence: Secondary | ICD-10-CM | POA: Diagnosis not present

## 2020-02-01 DIAGNOSIS — G43909 Migraine, unspecified, not intractable, without status migrainosus: Secondary | ICD-10-CM | POA: Diagnosis not present

## 2020-02-01 DIAGNOSIS — F609 Personality disorder, unspecified: Secondary | ICD-10-CM | POA: Diagnosis not present

## 2020-02-01 DIAGNOSIS — F329 Major depressive disorder, single episode, unspecified: Secondary | ICD-10-CM | POA: Diagnosis not present

## 2020-02-01 DIAGNOSIS — K219 Gastro-esophageal reflux disease without esophagitis: Secondary | ICD-10-CM | POA: Diagnosis not present

## 2020-02-01 DIAGNOSIS — Z96641 Presence of right artificial hip joint: Secondary | ICD-10-CM | POA: Diagnosis not present

## 2020-02-01 DIAGNOSIS — S42211D Unspecified displaced fracture of surgical neck of right humerus, subsequent encounter for fracture with routine healing: Secondary | ICD-10-CM | POA: Diagnosis not present

## 2020-02-08 DIAGNOSIS — M6281 Muscle weakness (generalized): Secondary | ICD-10-CM | POA: Diagnosis not present

## 2020-02-08 DIAGNOSIS — W182XXD Fall in (into) shower or empty bathtub, subsequent encounter: Secondary | ICD-10-CM | POA: Diagnosis not present

## 2020-02-08 DIAGNOSIS — M25511 Pain in right shoulder: Secondary | ICD-10-CM | POA: Diagnosis not present

## 2020-02-10 DIAGNOSIS — H5203 Hypermetropia, bilateral: Secondary | ICD-10-CM | POA: Diagnosis not present

## 2020-02-10 DIAGNOSIS — H52209 Unspecified astigmatism, unspecified eye: Secondary | ICD-10-CM | POA: Diagnosis not present

## 2020-02-10 DIAGNOSIS — H5213 Myopia, bilateral: Secondary | ICD-10-CM | POA: Diagnosis not present

## 2020-02-10 DIAGNOSIS — H524 Presbyopia: Secondary | ICD-10-CM | POA: Diagnosis not present

## 2020-02-15 DIAGNOSIS — W182XXD Fall in (into) shower or empty bathtub, subsequent encounter: Secondary | ICD-10-CM | POA: Diagnosis not present

## 2020-02-15 DIAGNOSIS — M25511 Pain in right shoulder: Secondary | ICD-10-CM | POA: Diagnosis not present

## 2020-02-15 DIAGNOSIS — M6281 Muscle weakness (generalized): Secondary | ICD-10-CM | POA: Diagnosis not present

## 2020-02-19 DIAGNOSIS — M6281 Muscle weakness (generalized): Secondary | ICD-10-CM | POA: Diagnosis not present

## 2020-02-19 DIAGNOSIS — M25511 Pain in right shoulder: Secondary | ICD-10-CM | POA: Diagnosis not present

## 2020-02-19 DIAGNOSIS — W182XXD Fall in (into) shower or empty bathtub, subsequent encounter: Secondary | ICD-10-CM | POA: Diagnosis not present

## 2020-02-23 DIAGNOSIS — M6281 Muscle weakness (generalized): Secondary | ICD-10-CM | POA: Diagnosis not present

## 2020-02-23 DIAGNOSIS — M25511 Pain in right shoulder: Secondary | ICD-10-CM | POA: Diagnosis not present

## 2020-02-23 DIAGNOSIS — W182XXD Fall in (into) shower or empty bathtub, subsequent encounter: Secondary | ICD-10-CM | POA: Diagnosis not present

## 2020-02-25 ENCOUNTER — Ambulatory Visit: Payer: Medicare HMO

## 2020-02-26 DIAGNOSIS — W182XXD Fall in (into) shower or empty bathtub, subsequent encounter: Secondary | ICD-10-CM | POA: Diagnosis not present

## 2020-02-26 DIAGNOSIS — M6281 Muscle weakness (generalized): Secondary | ICD-10-CM | POA: Diagnosis not present

## 2020-02-26 DIAGNOSIS — M25511 Pain in right shoulder: Secondary | ICD-10-CM | POA: Diagnosis not present

## 2020-02-27 ENCOUNTER — Ambulatory Visit: Payer: Medicare HMO | Attending: Internal Medicine

## 2020-02-27 DIAGNOSIS — Z23 Encounter for immunization: Secondary | ICD-10-CM | POA: Insufficient documentation

## 2020-02-27 NOTE — Progress Notes (Signed)
   Covid-19 Vaccination Clinic  Name:  [name]    MRN: [id number] DOB: July 06, 1956  02/27/2020  [name] was observed post Covid-19 immunization for 15 minutes without incident. She was provided with Vaccine Information Sheet and instruction to access the V-Safe system.   [name] was instructed to call 911 with any severe reactions post vaccine: Marland Kitchen Difficulty breathing  . Swelling of face and throat  . A fast heartbeat  . A bad rash all over body  . Dizziness and weakness   Immunizations Administered    Name Date Dose VIS Date Route   Pfizer COVID-19 Vaccine 02/27/2020  2:20 PM 0.3 mL 12/04/2019 Intramuscular   Manufacturer: [place]   Lot: [id number]   [place]: [id number]

## 2020-03-01 DIAGNOSIS — W182XXD Fall in (into) shower or empty bathtub, subsequent encounter: Secondary | ICD-10-CM | POA: Diagnosis not present

## 2020-03-01 DIAGNOSIS — M6281 Muscle weakness (generalized): Secondary | ICD-10-CM | POA: Diagnosis not present

## 2020-03-01 DIAGNOSIS — M25511 Pain in right shoulder: Secondary | ICD-10-CM | POA: Diagnosis not present

## 2020-03-02 DIAGNOSIS — R296 Repeated falls: Secondary | ICD-10-CM | POA: Diagnosis not present

## 2020-03-02 DIAGNOSIS — G5603 Carpal tunnel syndrome, bilateral upper limbs: Secondary | ICD-10-CM | POA: Diagnosis not present

## 2020-03-02 DIAGNOSIS — D696 Thrombocytopenia, unspecified: Secondary | ICD-10-CM | POA: Diagnosis not present

## 2020-03-02 DIAGNOSIS — F603 Borderline personality disorder: Secondary | ICD-10-CM | POA: Diagnosis not present

## 2020-03-02 DIAGNOSIS — E785 Hyperlipidemia, unspecified: Secondary | ICD-10-CM | POA: Diagnosis not present

## 2020-03-02 DIAGNOSIS — E441 Mild protein-calorie malnutrition: Secondary | ICD-10-CM | POA: Diagnosis not present

## 2020-03-02 DIAGNOSIS — G43909 Migraine, unspecified, not intractable, without status migrainosus: Secondary | ICD-10-CM | POA: Diagnosis not present

## 2020-03-03 DIAGNOSIS — W182XXD Fall in (into) shower or empty bathtub, subsequent encounter: Secondary | ICD-10-CM | POA: Diagnosis not present

## 2020-03-03 DIAGNOSIS — M25511 Pain in right shoulder: Secondary | ICD-10-CM | POA: Diagnosis not present

## 2020-03-03 DIAGNOSIS — M6281 Muscle weakness (generalized): Secondary | ICD-10-CM | POA: Diagnosis not present

## 2020-03-08 DIAGNOSIS — M25511 Pain in right shoulder: Secondary | ICD-10-CM | POA: Diagnosis not present

## 2020-03-08 DIAGNOSIS — W182XXD Fall in (into) shower or empty bathtub, subsequent encounter: Secondary | ICD-10-CM | POA: Diagnosis not present

## 2020-03-08 DIAGNOSIS — M6281 Muscle weakness (generalized): Secondary | ICD-10-CM | POA: Diagnosis not present

## 2020-03-09 ENCOUNTER — Other Ambulatory Visit: Payer: Self-pay | Admitting: Obstetrics and Gynecology

## 2020-03-09 DIAGNOSIS — Z1231 Encounter for screening mammogram for malignant neoplasm of breast: Secondary | ICD-10-CM

## 2020-03-15 DIAGNOSIS — W182XXD Fall in (into) shower or empty bathtub, subsequent encounter: Secondary | ICD-10-CM | POA: Diagnosis not present

## 2020-03-15 DIAGNOSIS — M6281 Muscle weakness (generalized): Secondary | ICD-10-CM | POA: Diagnosis not present

## 2020-03-15 DIAGNOSIS — M25511 Pain in right shoulder: Secondary | ICD-10-CM | POA: Diagnosis not present

## 2020-03-17 DIAGNOSIS — M6281 Muscle weakness (generalized): Secondary | ICD-10-CM | POA: Diagnosis not present

## 2020-03-17 DIAGNOSIS — W182XXD Fall in (into) shower or empty bathtub, subsequent encounter: Secondary | ICD-10-CM | POA: Diagnosis not present

## 2020-03-17 DIAGNOSIS — M25511 Pain in right shoulder: Secondary | ICD-10-CM | POA: Diagnosis not present

## 2020-03-21 ENCOUNTER — Other Ambulatory Visit: Payer: Self-pay | Admitting: Orthopedic Surgery

## 2020-03-21 DIAGNOSIS — S42201A Unspecified fracture of upper end of right humerus, initial encounter for closed fracture: Secondary | ICD-10-CM

## 2020-03-21 DIAGNOSIS — M6281 Muscle weakness (generalized): Secondary | ICD-10-CM | POA: Diagnosis not present

## 2020-03-21 DIAGNOSIS — M25511 Pain in right shoulder: Secondary | ICD-10-CM | POA: Diagnosis not present

## 2020-03-21 DIAGNOSIS — W182XXD Fall in (into) shower or empty bathtub, subsequent encounter: Secondary | ICD-10-CM | POA: Diagnosis not present

## 2020-03-22 DIAGNOSIS — G43909 Migraine, unspecified, not intractable, without status migrainosus: Secondary | ICD-10-CM | POA: Diagnosis not present

## 2020-03-22 DIAGNOSIS — R636 Underweight: Secondary | ICD-10-CM | POA: Diagnosis not present

## 2020-03-22 DIAGNOSIS — F339 Major depressive disorder, recurrent, unspecified: Secondary | ICD-10-CM | POA: Diagnosis not present

## 2020-03-22 DIAGNOSIS — Z681 Body mass index (BMI) 19 or less, adult: Secondary | ICD-10-CM | POA: Diagnosis not present

## 2020-03-22 DIAGNOSIS — M533 Sacrococcygeal disorders, not elsewhere classified: Secondary | ICD-10-CM | POA: Diagnosis not present

## 2020-03-23 DIAGNOSIS — M25511 Pain in right shoulder: Secondary | ICD-10-CM | POA: Diagnosis not present

## 2020-03-23 DIAGNOSIS — M6281 Muscle weakness (generalized): Secondary | ICD-10-CM | POA: Diagnosis not present

## 2020-03-29 ENCOUNTER — Ambulatory Visit: Payer: Medicare HMO | Attending: Internal Medicine

## 2020-03-29 DIAGNOSIS — Z23 Encounter for immunization: Secondary | ICD-10-CM

## 2020-03-30 DIAGNOSIS — W182XXD Fall in (into) shower or empty bathtub, subsequent encounter: Secondary | ICD-10-CM | POA: Diagnosis not present

## 2020-03-30 DIAGNOSIS — M25511 Pain in right shoulder: Secondary | ICD-10-CM | POA: Diagnosis not present

## 2020-03-30 DIAGNOSIS — M6281 Muscle weakness (generalized): Secondary | ICD-10-CM | POA: Diagnosis not present

## 2020-04-01 ENCOUNTER — Ambulatory Visit: Payer: Medicare HMO

## 2020-04-01 DIAGNOSIS — M25511 Pain in right shoulder: Secondary | ICD-10-CM | POA: Diagnosis not present

## 2020-04-01 DIAGNOSIS — M6281 Muscle weakness (generalized): Secondary | ICD-10-CM | POA: Diagnosis not present

## 2020-04-05 ENCOUNTER — Ambulatory Visit
Admission: RE | Admit: 2020-04-05 | Discharge: 2020-04-05 | Disposition: A | Discharge status: Home / Self Care | Payer: Medicare HMO | Source: Ambulatory Visit | Attending: Orthopedic Surgery | Admitting: Orthopedic Surgery

## 2020-04-05 ENCOUNTER — Other Ambulatory Visit: Payer: Medicare HMO

## 2020-04-05 DIAGNOSIS — S42201A Unspecified fracture of upper end of right humerus, initial encounter for closed fracture: Secondary | ICD-10-CM

## 2020-04-05 DIAGNOSIS — M25511 Pain in right shoulder: Secondary | ICD-10-CM

## 2020-04-08 DIAGNOSIS — M25551 Pain in right hip: Secondary | ICD-10-CM | POA: Diagnosis not present

## 2020-04-11 DIAGNOSIS — M25511 Pain in right shoulder: Secondary | ICD-10-CM | POA: Diagnosis not present

## 2020-04-12 ENCOUNTER — Other Ambulatory Visit: Payer: Self-pay

## 2020-04-12 ENCOUNTER — Emergency Department (HOSPITAL_BASED_OUTPATIENT_CLINIC_OR_DEPARTMENT_OTHER)
Admission: EM | Admit: 2020-04-12 | Discharge: 2020-04-13 | Disposition: A | Discharge status: Home / Self Care | Payer: Medicare HMO | Attending: Emergency Medicine | Admitting: Emergency Medicine

## 2020-04-12 DIAGNOSIS — N200 Calculus of kidney: Secondary | ICD-10-CM | POA: Diagnosis not present

## 2020-04-12 DIAGNOSIS — Z96641 Presence of right artificial hip joint: Secondary | ICD-10-CM | POA: Insufficient documentation

## 2020-04-12 DIAGNOSIS — R112 Nausea with vomiting, unspecified: Secondary | ICD-10-CM | POA: Diagnosis not present

## 2020-04-12 DIAGNOSIS — Z79899 Other long term (current) drug therapy: Secondary | ICD-10-CM | POA: Insufficient documentation

## 2020-04-12 DIAGNOSIS — R001 Bradycardia, unspecified: Secondary | ICD-10-CM | POA: Diagnosis not present

## 2020-04-12 DIAGNOSIS — Z87891 Personal history of nicotine dependence: Secondary | ICD-10-CM | POA: Insufficient documentation

## 2020-04-12 DIAGNOSIS — R52 Pain, unspecified: Secondary | ICD-10-CM | POA: Diagnosis not present

## 2020-04-12 DIAGNOSIS — R5381 Other malaise: Secondary | ICD-10-CM | POA: Diagnosis not present

## 2020-04-12 DIAGNOSIS — M25551 Pain in right hip: Secondary | ICD-10-CM | POA: Diagnosis present

## 2020-04-12 DIAGNOSIS — N132 Hydronephrosis with renal and ureteral calculous obstruction: Secondary | ICD-10-CM | POA: Diagnosis not present

## 2020-04-12 NOTE — ED Notes (Signed)
Pt states fall and R hip/arm injury at the end of march 2021. States increased pain today. Ambulatory. Pt also refused to undress for assessment. Can be verbally aggressive. Oriented x 4.

## 2020-04-12 NOTE — ED Triage Notes (Signed)
Pt arrived via EMS right iliac crest pain from previous fall, plate in r shoulder. Hard to take a deep breath. NAD, A&Ox4  20g R forearm 500 bag bolused in EMS, pressure soft @ 88/40, 97% RA, 18rr, 97 temp, 58 bpm

## 2020-04-13 ENCOUNTER — Emergency Department (HOSPITAL_BASED_OUTPATIENT_CLINIC_OR_DEPARTMENT_OTHER): Payer: Medicare HMO

## 2020-04-13 DIAGNOSIS — M25551 Pain in right hip: Secondary | ICD-10-CM | POA: Diagnosis not present

## 2020-04-13 DIAGNOSIS — N200 Calculus of kidney: Secondary | ICD-10-CM | POA: Diagnosis not present

## 2020-04-13 DIAGNOSIS — N132 Hydronephrosis with renal and ureteral calculous obstruction: Secondary | ICD-10-CM | POA: Diagnosis not present

## 2020-04-13 LAB — URINALYSIS, ROUTINE W REFLEX MICROSCOPIC
Bilirubin Urine: NEGATIVE
Glucose, UA: NEGATIVE mg/dL
Ketones, ur: NEGATIVE mg/dL
Leukocytes,Ua: NEGATIVE
Nitrite: NEGATIVE
Protein, ur: NEGATIVE mg/dL
Specific Gravity, Urine: 1.02 (ref 1.005–1.030)
pH: 6 (ref 5.0–8.0)

## 2020-04-13 LAB — CBC WITH DIFFERENTIAL/PLATELET
Abs Immature Granulocytes: 0.02 10*3/uL (ref 0.00–0.07)
Basophils Absolute: 0.1 10*3/uL (ref 0.0–0.1)
Basophils Relative: 1 %
Eosinophils Absolute: 0.1 10*3/uL (ref 0.0–0.5)
Eosinophils Relative: 1 %
HCT: 37 % (ref 36.0–46.0)
Hemoglobin: 11.9 g/dL — ABNORMAL LOW (ref 12.0–15.0)
Immature Granulocytes: 0 %
Lymphocytes Relative: 26 %
Lymphs Abs: 2.2 10*3/uL (ref 0.7–4.0)
MCH: 33.6 pg (ref 26.0–34.0)
MCHC: 32.2 g/dL (ref 30.0–36.0)
MCV: 104.5 fL — ABNORMAL HIGH (ref 80.0–100.0)
Monocytes Absolute: 0.9 10*3/uL (ref 0.1–1.0)
Monocytes Relative: 10 %
Neutro Abs: 5.2 10*3/uL (ref 1.7–7.7)
Neutrophils Relative %: 62 %
Platelets: 149 10*3/uL — ABNORMAL LOW (ref 150–400)
RBC: 3.54 MIL/uL — ABNORMAL LOW (ref 3.87–5.11)
RDW: 12.2 % (ref 11.5–15.5)
WBC: 8.4 10*3/uL (ref 4.0–10.5)
nRBC: 0 % (ref 0.0–0.2)

## 2020-04-13 LAB — COMPREHENSIVE METABOLIC PANEL
ALT: 7 U/L (ref 0–44)
AST: 13 U/L — ABNORMAL LOW (ref 15–41)
Albumin: 3.6 g/dL (ref 3.5–5.0)
Alkaline Phosphatase: 58 U/L (ref 38–126)
Anion gap: 7 (ref 5–15)
BUN: 21 mg/dL (ref 8–23)
CO2: 20 mmol/L — ABNORMAL LOW (ref 22–32)
Calcium: 8.7 mg/dL — ABNORMAL LOW (ref 8.9–10.3)
Chloride: 110 mmol/L (ref 98–111)
Creatinine, Ser: 1.51 mg/dL — ABNORMAL HIGH (ref 0.44–1.00)
GFR calc Af Amer: 42 mL/min — ABNORMAL LOW (ref >60)
GFR calc non Af Amer: 36 mL/min — ABNORMAL LOW (ref >60)
Glucose, Bld: 112 mg/dL — ABNORMAL HIGH (ref 70–99)
Potassium: 4.2 mmol/L (ref 3.5–5.1)
Sodium: 137 mmol/L (ref 135–145)
Total Bilirubin: 0.3 mg/dL (ref 0.3–1.2)
Total Protein: 6.3 g/dL — ABNORMAL LOW (ref 6.5–8.1)

## 2020-04-13 LAB — URINALYSIS, MICROSCOPIC (REFLEX)

## 2020-04-13 MED ORDER — PROMETHAZINE HCL 25 MG PO TABS: 25.0000 mg | ORAL_TABLET | Freq: Four times a day (QID) | ORAL | 0 refills | Status: DC | PRN

## 2020-04-13 MED ORDER — SODIUM CHLORIDE 0.9 % IV BOLUS (SEPSIS)
1000.0000 mL | Freq: Once | INTRAVENOUS | Status: AC
  Administered 2020-04-13: 1000 mL via INTRAVENOUS

## 2020-04-13 MED ORDER — KETOROLAC TROMETHAMINE 15 MG/ML IJ SOLN
15.0000 mg | Freq: Once | INTRAMUSCULAR | Status: AC
  Administered 2020-04-13: 15 mg via INTRAVENOUS
  Filled 2020-04-13: qty 1

## 2020-04-13 NOTE — ED Provider Notes (Signed)
TIME SEEN: 1:55 AM  CHIEF COMPLAINT: Right sided abdominal pain  HPI: Patient is a 64 year old female with history of kidney stones who presents to the emergency department with several days of pain at the right iliac crest.  She does report that she fell about a month ago injuring the right hip.  She had x-rays with her orthopedist a week ago that showed no acute abnormality.  She is followed by Dr. Fredonia Highland as she has had a previous right hip replacement in 2017.  She states she fell because the road was uneven that she was walking on.  She did hit her head at that time but is not on blood thinners.  She states that this pain over the right iliac crest is new for her and was not present after her fall.  She is concerned she could have a kidney stone.  She did have some nausea and vomiting on Thursday but this has resolved.  No diarrhea.  No fever.  She has history of chronic right arm pain from previous shoulder surgery.  Denies dysuria, hematuria, vaginal bleeding or discharge.  ROS: See HPI Constitutional: no fever  Eyes: no drainage  ENT: no runny nose   Cardiovascular:  no chest pain  Resp: no SOB  GI:  vomiting GU: no dysuria Integumentary: no rash  Allergy: no hives  Musculoskeletal: no leg swelling  Neurological: no slurred speech ROS otherwise negative  PAST MEDICAL HISTORY/PAST SURGICAL HISTORY:  Past Medical History:  Diagnosis Date  . Anxiety   . Arthritis   . Carpal tunnel syndrome of left wrist   . Complication of anesthesia    states she had to much and once adn ended up on o2, no problems since  . Cubital tunnel syndrome on left   . Depression   . Dyspnea   . GERD (gastroesophageal reflux disease)   . History of kidney stones   . History of suicide attempt 2012--  XANAX OVERDOSE  . Hypotension    reports that her SBP can run < 90  . Migraine   . Personality disorder ([place])   . Proximal humerus fracture    right  . Vitamin B12 deficiency 03/26/2018     MEDICATIONS:  Prior to Admission medications   Medication Sig Start Date End Date Taking? Authorizing Provider  acetaminophen (TYLENOL) 500 MG tablet Take 500 mg by mouth every 6 (six) hours as needed.    [provider]  clonazePAM (KLONOPIN) 1 MG tablet Take 0.5-2 mg by mouth 4 (four) times daily as needed for anxiety. 2 mg at bedtime (scheduled)    [provider]  nefazodone (SERZONE) 200 MG tablet Take 200 mg by mouth daily.     [provider]  topiramate (TOPAMAX) 50 MG tablet Take 250 mg by mouth daily.    [provider]  traMADol (ULTRAM) 50 MG tablet Take by mouth every 6 (six) hours as needed.    [provider]  traZODone (DESYREL) 50 MG tablet Take 50 mg by mouth at bedtime.    [provider]  valACYclovir (VALTREX) 500 MG tablet Take 500 mg by mouth at bedtime.    [provider]    ALLERGIES:  Allergies  Allergen Reactions  . Citalopram     All SSRI's give her fever, insomnia  . Other     All antibiotics give her a yeast infection  . Zocor [Simvastatin] Anxiety    nervous    SOCIAL HISTORY:  Social History  Tobacco Use  . Smoking status: Former Research scientist (life sciences)  . Smokeless tobacco: Never Used  . Tobacco comment: quit smoking 25 years ago  Substance Use Topics  . Alcohol use: No    FAMILY HISTORY: Family History  Problem Relation Age of Onset  . Breast cancer Mother     EXAM: BP 97/63   Pulse 66   Temp 98.2 F (36.8 C) (Oral)   Resp 18   Ht 5' (1.524 m)   Wt 42.6 kg   SpO2 100%   BMI 18.36 kg/m  CONSTITUTIONAL: Alert and oriented and responds appropriately to questions. Well-appearing; well-nourished, thin, in no distress HEAD: Normocephalic EYES: Conjunctivae clear, pupils appear equal, EOM appear intact ENT: normal nose; moist mucous membranes NECK: Supple, normal ROM CARD: RRR; S1 and S2 appreciated; no murmurs, no clicks, no rubs, no gallops RESP: Normal chest excursion without  splinting or tachypnea; breath sounds clear and equal bilaterally; no wheezes, no rhonchi, no rales, no hypoxia or respiratory distress, speaking full sentences ABD/GI: Normal bowel sounds; non-distended; soft, non-tender, no rebound, no guarding, no peritoneal signs, no hepatosplenomegaly BACK:  The back appears normal EXT: Normal ROM in all joints; no deformity noted, no edema; no cyanosis, pelvis is stable, no hip deformity, 2+ right DP pulse SKIN: Normal color for age and race; warm; no rash on exposed skin NEURO: Moves all extremities equally, normal sensation diffusely PSYCH: [name] mood and manner are appropriate.   MEDICAL DECISION MAKING: Patient here with right abdominal pain that feels similar to her previous kidney stones.  Also had a fall about a month ago but reports normal x-rays of the right hip.  Patient declines chronic pain medicine at this time she states that they make her vomit.  Will give Toradol.  She has had a slightly low blood pressure but I suspect that this is actually from her being a very slender woman.  Will give IV fluids and reassess.  Will obtain labs, urine and CT renal study and will include the right hip.  ED PROGRESS: Labs, urine, imaging reviewed/interpreted.  Labs and urine reassuring.  No urinary tract infection today.  CT scan does show that she has a 5 mm right ureteral stone with hydronephrosis which is likely causing her discomfort.  No periprosthetic fracture seen and no dislocation.  She reports feeling much better after Toradol.  Have recommended Tylenol and Motrin for pain at home.  Have offered her narcotic pain medication but she has declined this multiple times and does not want a prescription.  She is requesting that I discharge her with Phenergan.  Will give outpatient urology follow-up information.  Her vital signs have improved with IV fluids here.   At this time, I do not feel there is any life-threatening condition present. I have  reviewed, interpreted and discussed all results (EKG, imaging, lab, urine as appropriate) and exam findings with patient/family. I have reviewed nursing notes and appropriate previous records.  I feel the patient is safe to be discharged home without further emergent workup and can continue workup as an outpatient as needed. Discussed usual and customary return precautions. Patient/family verbalize understanding and are comfortable with this plan.  Outpatient follow-up has been provided as needed. All questions have been answered.    Yukie Roda was evaluated in Emergency Department on 04/13/2020 for the symptoms described in the history of present illness. She was evaluated in the context of the global COVID-19 pandemic, which necessitated consideration that the patient might be  at risk for infection with the SARS-CoV-2 virus that causes COVID-19. Institutional protocols and algorithms that pertain to the evaluation of patients at risk for COVID-19 are in a state of rapid change based on information released by regulatory bodies including the CDC and federal and state organizations. These policies and algorithms were followed during the patient's care in the ED.      Aniella Wandrey, Delice Bison, DO 04/13/20 0710

## 2020-04-13 NOTE — Discharge Instructions (Addendum)
You may alternate Tylenol 1000 mg every 6 hours as needed for pain and ibuprofen 600 mg every 6 hours as needed for pain.

## 2020-04-15 ENCOUNTER — Other Ambulatory Visit: Payer: Self-pay | Admitting: Urology

## 2020-04-15 DIAGNOSIS — N201 Calculus of ureter: Secondary | ICD-10-CM | POA: Diagnosis not present

## 2020-04-15 DIAGNOSIS — R1084 Generalized abdominal pain: Secondary | ICD-10-CM | POA: Diagnosis not present

## 2020-04-15 DIAGNOSIS — N200 Calculus of kidney: Secondary | ICD-10-CM

## 2020-04-18 ENCOUNTER — Other Ambulatory Visit (HOSPITAL_COMMUNITY)
Admission: RE | Admit: 2020-04-18 | Discharge: 2020-04-18 | Disposition: A | Discharge status: Home / Self Care | Payer: Medicare HMO | Source: Ambulatory Visit | Attending: Urology | Admitting: Urology

## 2020-04-18 DIAGNOSIS — Z01812 Encounter for preprocedural laboratory examination: Secondary | ICD-10-CM | POA: Insufficient documentation

## 2020-04-18 DIAGNOSIS — Z20822 Contact with and (suspected) exposure to covid-19: Secondary | ICD-10-CM | POA: Diagnosis not present

## 2020-04-18 LAB — SARS CORONAVIRUS 2 (TAT 6-24 HRS): SARS Coronavirus 2: NEGATIVE

## 2020-04-19 DIAGNOSIS — R1084 Generalized abdominal pain: Secondary | ICD-10-CM | POA: Diagnosis not present

## 2020-04-19 DIAGNOSIS — N201 Calculus of ureter: Secondary | ICD-10-CM | POA: Diagnosis not present

## 2020-04-20 ENCOUNTER — Other Ambulatory Visit: Payer: Self-pay | Admitting: Urology

## 2020-04-20 ENCOUNTER — Encounter (HOSPITAL_BASED_OUTPATIENT_CLINIC_OR_DEPARTMENT_OTHER): Payer: Self-pay | Admitting: Urology

## 2020-04-20 ENCOUNTER — Other Ambulatory Visit: Payer: Self-pay

## 2020-04-20 NOTE — Progress Notes (Signed)
Spoke w/ via phone for pre-op interview---patient Lab needs dos----  none            Lab results------cbc with dif, cmet 04-13-2020 epic COVID test ------04-21-2020 @900  am Arrive at -------1230 pm 04-22-2020 NPO after mn food, clear liquids until 830 am then npo Medications to take morning of surgery -----clonazeoam prn, topamax, nefazadone, lidocaine patch prn, omeprazole Diabetic medication ----- Patient Special Instructions -----none Pre-Op special Istructions -----none Patient verbalized understanding of instructions that were given at this phone interview. Patient denies shortness of breath, chest pain, fever, cough a this phone  interview.  DO NOT USE RIGHT ARM FOR ANYTHING PER PATIENT HAS OLD FRACTURE AND VERY LIMITED RIGHT ARM MOBILITY

## 2020-04-21 ENCOUNTER — Ambulatory Visit (HOSPITAL_BASED_OUTPATIENT_CLINIC_OR_DEPARTMENT_OTHER): Admission: RE | Admit: 2020-04-21 | Payer: Medicare HMO | Source: Home / Self Care | Admitting: Urology

## 2020-04-21 ENCOUNTER — Other Ambulatory Visit (HOSPITAL_COMMUNITY): Payer: Medicare HMO

## 2020-04-21 ENCOUNTER — Encounter (HOSPITAL_BASED_OUTPATIENT_CLINIC_OR_DEPARTMENT_OTHER): Admission: RE | Payer: Self-pay | Source: Home / Self Care

## 2020-04-21 SURGERY — LITHOTRIPSY, ESWL
Anesthesia: LOCAL | Laterality: Right

## 2020-04-22 ENCOUNTER — Ambulatory Visit (HOSPITAL_BASED_OUTPATIENT_CLINIC_OR_DEPARTMENT_OTHER): Admission: RE | Admit: 2020-04-22 | Payer: Medicare HMO | Source: Home / Self Care | Admitting: Urology

## 2020-04-22 HISTORY — DX: Attention-deficit hyperactivity disorder, unspecified type: F90.9

## 2020-04-22 HISTORY — DX: Urinary tract infection, site not specified: N39.0

## 2020-04-22 HISTORY — DX: Contracture of muscle, right upper arm: M62.421

## 2020-04-22 SURGERY — CYSTOSCOPY, FLEXIBLE, WITH STENT REPLACEMENT
Anesthesia: General | Laterality: Right

## 2020-04-26 DIAGNOSIS — R8290 Unspecified abnormal findings in urine: Secondary | ICD-10-CM | POA: Diagnosis not present

## 2020-04-26 DIAGNOSIS — N201 Calculus of ureter: Secondary | ICD-10-CM | POA: Diagnosis not present

## 2020-04-26 DIAGNOSIS — N132 Hydronephrosis with renal and ureteral calculous obstruction: Secondary | ICD-10-CM | POA: Diagnosis not present

## 2020-04-26 DIAGNOSIS — M545 Low back pain: Secondary | ICD-10-CM | POA: Diagnosis not present

## 2020-04-26 DIAGNOSIS — N200 Calculus of kidney: Secondary | ICD-10-CM | POA: Diagnosis not present

## 2020-04-27 ENCOUNTER — Ambulatory Visit: Payer: Medicare HMO

## 2020-04-27 DIAGNOSIS — R8290 Unspecified abnormal findings in urine: Secondary | ICD-10-CM | POA: Insufficient documentation

## 2020-04-27 DIAGNOSIS — N201 Calculus of ureter: Secondary | ICD-10-CM | POA: Insufficient documentation

## 2020-04-27 DIAGNOSIS — N200 Calculus of kidney: Secondary | ICD-10-CM | POA: Insufficient documentation

## 2020-05-02 DIAGNOSIS — M545 Low back pain: Secondary | ICD-10-CM | POA: Diagnosis not present

## 2020-05-02 DIAGNOSIS — R8290 Unspecified abnormal findings in urine: Secondary | ICD-10-CM | POA: Diagnosis not present

## 2020-05-02 DIAGNOSIS — N200 Calculus of kidney: Secondary | ICD-10-CM | POA: Diagnosis not present

## 2020-05-02 DIAGNOSIS — N201 Calculus of ureter: Secondary | ICD-10-CM | POA: Diagnosis not present

## 2020-05-02 DIAGNOSIS — K219 Gastro-esophageal reflux disease without esophagitis: Secondary | ICD-10-CM | POA: Diagnosis not present

## 2020-05-02 DIAGNOSIS — N202 Calculus of kidney with calculus of ureter: Secondary | ICD-10-CM | POA: Diagnosis not present

## 2020-05-03 ENCOUNTER — Ambulatory Visit: Payer: Medicare HMO

## 2020-05-10 DIAGNOSIS — I959 Hypotension, unspecified: Secondary | ICD-10-CM | POA: Diagnosis not present

## 2020-05-10 DIAGNOSIS — E538 Deficiency of other specified B group vitamins: Secondary | ICD-10-CM | POA: Diagnosis not present

## 2020-05-10 DIAGNOSIS — G43909 Migraine, unspecified, not intractable, without status migrainosus: Secondary | ICD-10-CM | POA: Diagnosis not present

## 2020-05-10 DIAGNOSIS — R296 Repeated falls: Secondary | ICD-10-CM | POA: Diagnosis not present

## 2020-05-10 DIAGNOSIS — R42 Dizziness and giddiness: Secondary | ICD-10-CM | POA: Diagnosis not present

## 2020-05-13 DIAGNOSIS — Z466 Encounter for fitting and adjustment of urinary device: Secondary | ICD-10-CM | POA: Diagnosis not present

## 2020-05-14 DIAGNOSIS — N760 Acute vaginitis: Secondary | ICD-10-CM | POA: Diagnosis not present

## 2020-05-16 ENCOUNTER — Other Ambulatory Visit: Payer: Self-pay | Admitting: *Deleted

## 2020-05-16 NOTE — Patient Outreach (Signed)
[place] [facility] [facility]) Care Management  05/16/2020  CETERA TICER 02/12/1956 [id number]   Telephone Screening   Referral Date : 05/11/20 Referral Source: [place] Management  Referral Reason : Kidney stone surgery  Insurance : [employer]    Outreach attempt #1 Subjective: Unsuccessful outreach call to patient , able to leave a HIPAA compliant voice message for return call.     Plan: Will send [facility] patient unsuccessful outreach letter  Will plan return call in the next 4 business days.    Joylene Draft, RN, BSN  [place] Management Coordinator  [phone]- Mobile [phone]- Toll Free Main Office

## 2020-05-19 ENCOUNTER — Other Ambulatory Visit: Payer: Self-pay | Admitting: *Deleted

## 2020-05-19 DIAGNOSIS — N289 Disorder of kidney and ureter, unspecified: Secondary | ICD-10-CM | POA: Diagnosis not present

## 2020-05-19 NOTE — Patient Outreach (Signed)
[place] [facility]) Care Management  05/19/2020  [name] 07-15-1956 [id number]   Telephone Screening   Referral Date : 05/11/20 Referral Source: [place] Management  Referral Reason : Kidney stone surgery  Insurance : [place] attempt #2 Subjective: Successful outreach call to patient, explained the reason for the call and [name] care management services.   Patient discussed that "everything is  alright now', she explained that she has had her kidney stone surgery outpatient and  stent placed and removed.  She reports that she is urinating without any problems, denies pain or discomfort. She reports having visit at PCP office today for follow up labs on kidney function.  Social Patient lives at home alone, she reports being self sufficient, she report being able to drive self to her local appointments, she does not drive on the  big highways. lives alone in a one story town home. 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  She reports having a sister that lives nearby she was able to provide transportation at the time of her kidney procedure, but she is busy with her grandchildren a lot .  Discussed transportation services such as [employer] in the area, and [employer] transportation benefit, she states she will not use those services. She discussed driving herself to grocery store on today.   Conditions Patient discussed history of frequent falls due to vertigo that is being treated, recent fall from her bed no injury. She reports having one leg shorter than the other since hip surgery in the past. She denies injuries related to falls reinforced use of cane and safety. [name] discussed history of depression she is followed by psychologist every 3 months and therapist at [place] counsiling  twice a month. She denies history of Hypertension , Diabetes , heart failure .   Medication  She denies cost concern related to medication,  taking medication as prescribed.   Advanced Directive Patient has living will and reports being DNR.   Consent  Discussed [facility] care management services she declines need for further follow up calls for support at this time. Discussed patient has been sent [facility] care management information letter with contact number if new concerns identified.    Plan Will plan case closure .    Susan Draft, RN, BSN  [place] Management Coordinator  [phone]- Mobile [phone]- Toll Free Main Office

## 2020-05-26 DIAGNOSIS — Z01419 Encounter for gynecological examination (general) (routine) without abnormal findings: Secondary | ICD-10-CM | POA: Diagnosis not present

## 2020-05-26 DIAGNOSIS — N952 Postmenopausal atrophic vaginitis: Secondary | ICD-10-CM | POA: Diagnosis not present

## 2020-05-26 DIAGNOSIS — Z9189 Other specified personal risk factors, not elsewhere classified: Secondary | ICD-10-CM | POA: Diagnosis not present

## 2020-05-26 DIAGNOSIS — Z8619 Personal history of other infectious and parasitic diseases: Secondary | ICD-10-CM | POA: Diagnosis not present

## 2020-06-10 ENCOUNTER — Other Ambulatory Visit: Payer: Self-pay

## 2020-06-10 ENCOUNTER — Ambulatory Visit
Admission: RE | Admit: 2020-06-10 | Discharge: 2020-06-10 | Disposition: A | Discharge status: Home / Self Care | Payer: Medicare HMO | Source: Ambulatory Visit | Attending: Obstetrics and Gynecology | Admitting: Obstetrics and Gynecology

## 2020-06-10 DIAGNOSIS — Z1231 Encounter for screening mammogram for malignant neoplasm of breast: Secondary | ICD-10-CM | POA: Diagnosis not present

## 2020-06-14 DIAGNOSIS — N289 Disorder of kidney and ureter, unspecified: Secondary | ICD-10-CM | POA: Diagnosis not present

## 2020-06-15 DIAGNOSIS — M545 Low back pain: Secondary | ICD-10-CM | POA: Diagnosis not present

## 2020-06-15 DIAGNOSIS — M5136 Other intervertebral disc degeneration, lumbar region: Secondary | ICD-10-CM | POA: Diagnosis not present

## 2020-06-15 DIAGNOSIS — F339 Major depressive disorder, recurrent, unspecified: Secondary | ICD-10-CM | POA: Diagnosis not present

## 2020-06-24 DIAGNOSIS — M25511 Pain in right shoulder: Secondary | ICD-10-CM | POA: Diagnosis not present

## 2020-06-24 DIAGNOSIS — M545 Low back pain: Secondary | ICD-10-CM | POA: Diagnosis not present

## 2020-06-24 DIAGNOSIS — M25512 Pain in left shoulder: Secondary | ICD-10-CM | POA: Diagnosis not present

## 2020-07-01 DIAGNOSIS — F339 Major depressive disorder, recurrent, unspecified: Secondary | ICD-10-CM | POA: Diagnosis not present

## 2020-07-01 DIAGNOSIS — F331 Major depressive disorder, recurrent, moderate: Secondary | ICD-10-CM | POA: Diagnosis not present

## 2020-07-04 DIAGNOSIS — J0110 Acute frontal sinusitis, unspecified: Secondary | ICD-10-CM | POA: Diagnosis not present

## 2020-07-04 DIAGNOSIS — R42 Dizziness and giddiness: Secondary | ICD-10-CM | POA: Diagnosis not present

## 2020-07-11 DIAGNOSIS — M25511 Pain in right shoulder: Secondary | ICD-10-CM | POA: Diagnosis not present

## 2020-07-15 DIAGNOSIS — F332 Major depressive disorder, recurrent severe without psychotic features: Secondary | ICD-10-CM | POA: Diagnosis not present

## 2020-07-22 DIAGNOSIS — F332 Major depressive disorder, recurrent severe without psychotic features: Secondary | ICD-10-CM | POA: Diagnosis not present

## 2020-08-18 DIAGNOSIS — F603 Borderline personality disorder: Secondary | ICD-10-CM | POA: Diagnosis not present

## 2020-08-24 DIAGNOSIS — F411 Generalized anxiety disorder: Secondary | ICD-10-CM | POA: Diagnosis not present

## 2020-08-24 DIAGNOSIS — Z87891 Personal history of nicotine dependence: Secondary | ICD-10-CM | POA: Diagnosis not present

## 2020-08-24 DIAGNOSIS — G4700 Insomnia, unspecified: Secondary | ICD-10-CM | POA: Diagnosis not present

## 2020-08-24 DIAGNOSIS — E785 Hyperlipidemia, unspecified: Secondary | ICD-10-CM | POA: Diagnosis not present

## 2020-08-24 DIAGNOSIS — F909 Attention-deficit hyperactivity disorder, unspecified type: Secondary | ICD-10-CM | POA: Diagnosis not present

## 2020-08-24 DIAGNOSIS — F419 Anxiety disorder, unspecified: Secondary | ICD-10-CM | POA: Diagnosis not present

## 2020-08-24 DIAGNOSIS — R45851 Suicidal ideations: Secondary | ICD-10-CM | POA: Diagnosis not present

## 2020-08-24 DIAGNOSIS — F603 Borderline personality disorder: Secondary | ICD-10-CM | POA: Diagnosis not present

## 2020-08-24 DIAGNOSIS — Z888 Allergy status to other drugs, medicaments and biological substances status: Secondary | ICD-10-CM | POA: Diagnosis not present

## 2020-08-24 DIAGNOSIS — F329 Major depressive disorder, single episode, unspecified: Secondary | ICD-10-CM | POA: Diagnosis not present

## 2020-08-24 DIAGNOSIS — R451 Restlessness and agitation: Secondary | ICD-10-CM | POA: Diagnosis not present

## 2020-08-24 DIAGNOSIS — F341 Dysthymic disorder: Secondary | ICD-10-CM | POA: Diagnosis not present

## 2020-08-24 DIAGNOSIS — Z20822 Contact with and (suspected) exposure to covid-19: Secondary | ICD-10-CM | POA: Diagnosis not present

## 2020-08-26 DIAGNOSIS — F341 Dysthymic disorder: Secondary | ICD-10-CM | POA: Insufficient documentation

## 2020-09-01 DIAGNOSIS — F411 Generalized anxiety disorder: Secondary | ICD-10-CM | POA: Diagnosis not present

## 2020-09-09 DIAGNOSIS — F411 Generalized anxiety disorder: Secondary | ICD-10-CM | POA: Diagnosis not present

## 2020-09-21 DIAGNOSIS — Z23 Encounter for immunization: Secondary | ICD-10-CM | POA: Diagnosis not present

## 2020-09-21 DIAGNOSIS — E441 Mild protein-calorie malnutrition: Secondary | ICD-10-CM | POA: Diagnosis not present

## 2020-09-21 DIAGNOSIS — M8588 Other specified disorders of bone density and structure, other site: Secondary | ICD-10-CM | POA: Diagnosis not present

## 2020-09-21 DIAGNOSIS — G43909 Migraine, unspecified, not intractable, without status migrainosus: Secondary | ICD-10-CM | POA: Diagnosis not present

## 2020-09-21 DIAGNOSIS — F13230 Sedative, hypnotic or anxiolytic dependence with withdrawal, uncomplicated: Secondary | ICD-10-CM | POA: Diagnosis not present

## 2020-09-21 DIAGNOSIS — F339 Major depressive disorder, recurrent, unspecified: Secondary | ICD-10-CM | POA: Diagnosis not present

## 2020-09-21 DIAGNOSIS — F603 Borderline personality disorder: Secondary | ICD-10-CM | POA: Diagnosis not present

## 2020-09-22 DIAGNOSIS — F411 Generalized anxiety disorder: Secondary | ICD-10-CM | POA: Diagnosis not present

## 2020-10-12 DIAGNOSIS — F411 Generalized anxiety disorder: Secondary | ICD-10-CM | POA: Diagnosis not present

## 2020-10-19 DIAGNOSIS — F411 Generalized anxiety disorder: Secondary | ICD-10-CM | POA: Diagnosis not present

## 2020-10-20 DIAGNOSIS — F339 Major depressive disorder, recurrent, unspecified: Secondary | ICD-10-CM | POA: Diagnosis not present

## 2020-10-20 DIAGNOSIS — E538 Deficiency of other specified B group vitamins: Secondary | ICD-10-CM | POA: Diagnosis not present

## 2020-10-20 DIAGNOSIS — E441 Mild protein-calorie malnutrition: Secondary | ICD-10-CM | POA: Diagnosis not present

## 2020-10-20 DIAGNOSIS — F13230 Sedative, hypnotic or anxiolytic dependence with withdrawal, uncomplicated: Secondary | ICD-10-CM | POA: Diagnosis not present

## 2020-10-20 DIAGNOSIS — R1013 Epigastric pain: Secondary | ICD-10-CM | POA: Diagnosis not present

## 2020-10-20 DIAGNOSIS — R232 Flushing: Secondary | ICD-10-CM | POA: Diagnosis not present

## 2020-10-20 DIAGNOSIS — L299 Pruritus, unspecified: Secondary | ICD-10-CM | POA: Diagnosis not present

## 2020-10-27 DIAGNOSIS — F411 Generalized anxiety disorder: Secondary | ICD-10-CM | POA: Diagnosis not present

## 2020-11-22 DIAGNOSIS — F411 Generalized anxiety disorder: Secondary | ICD-10-CM | POA: Diagnosis not present

## 2020-11-22 DIAGNOSIS — F332 Major depressive disorder, recurrent severe without psychotic features: Secondary | ICD-10-CM | POA: Diagnosis not present

## 2020-11-22 DIAGNOSIS — F603 Borderline personality disorder: Secondary | ICD-10-CM | POA: Diagnosis not present

## 2020-11-25 DIAGNOSIS — F332 Major depressive disorder, recurrent severe without psychotic features: Secondary | ICD-10-CM | POA: Diagnosis not present

## 2020-11-25 DIAGNOSIS — F411 Generalized anxiety disorder: Secondary | ICD-10-CM | POA: Diagnosis not present

## 2020-11-25 DIAGNOSIS — F603 Borderline personality disorder: Secondary | ICD-10-CM | POA: Diagnosis not present

## 2020-11-25 DIAGNOSIS — F13239 Sedative, hypnotic or anxiolytic dependence with withdrawal, unspecified: Secondary | ICD-10-CM | POA: Diagnosis not present

## 2020-12-05 ENCOUNTER — Emergency Department (HOSPITAL_COMMUNITY)
Admission: EM | Admit: 2020-12-05 | Discharge: 2020-12-07 | Disposition: A | Discharge status: Home / Self Care | Payer: Medicare HMO | Attending: Emergency Medicine | Admitting: Emergency Medicine

## 2020-12-05 ENCOUNTER — Other Ambulatory Visit: Payer: Self-pay

## 2020-12-05 ENCOUNTER — Encounter (HOSPITAL_COMMUNITY): Payer: Self-pay

## 2020-12-05 DIAGNOSIS — F411 Generalized anxiety disorder: Secondary | ICD-10-CM | POA: Diagnosis not present

## 2020-12-05 DIAGNOSIS — Z87891 Personal history of nicotine dependence: Secondary | ICD-10-CM | POA: Diagnosis not present

## 2020-12-05 DIAGNOSIS — R45851 Suicidal ideations: Secondary | ICD-10-CM | POA: Diagnosis not present

## 2020-12-05 DIAGNOSIS — F603 Borderline personality disorder: Secondary | ICD-10-CM | POA: Diagnosis present

## 2020-12-05 DIAGNOSIS — Z96641 Presence of right artificial hip joint: Secondary | ICD-10-CM | POA: Diagnosis not present

## 2020-12-05 DIAGNOSIS — F32A Depression, unspecified: Secondary | ICD-10-CM

## 2020-12-05 DIAGNOSIS — F331 Major depressive disorder, recurrent, moderate: Secondary | ICD-10-CM | POA: Diagnosis not present

## 2020-12-05 DIAGNOSIS — Z20822 Contact with and (suspected) exposure to covid-19: Secondary | ICD-10-CM | POA: Insufficient documentation

## 2020-12-05 DIAGNOSIS — F909 Attention-deficit hyperactivity disorder, unspecified type: Secondary | ICD-10-CM | POA: Insufficient documentation

## 2020-12-05 LAB — CBC WITH DIFFERENTIAL/PLATELET
Abs Immature Granulocytes: 0.01 10*3/uL (ref 0.00–0.07)
Basophils Absolute: 0.1 10*3/uL (ref 0.0–0.1)
Basophils Relative: 1 %
Eosinophils Absolute: 0.4 10*3/uL (ref 0.0–0.5)
Eosinophils Relative: 6 %
HCT: 40.4 % (ref 36.0–46.0)
Hemoglobin: 12.9 g/dL (ref 12.0–15.0)
Immature Granulocytes: 0 %
Lymphocytes Relative: 33 %
Lymphs Abs: 2.3 10*3/uL (ref 0.7–4.0)
MCH: 33.8 pg (ref 26.0–34.0)
MCHC: 31.9 g/dL (ref 30.0–36.0)
MCV: 105.8 fL — ABNORMAL HIGH (ref 80.0–100.0)
Monocytes Absolute: 0.7 10*3/uL (ref 0.1–1.0)
Monocytes Relative: 11 %
Neutro Abs: 3.3 10*3/uL (ref 1.7–7.7)
Neutrophils Relative %: 49 %
Platelets: 178 10*3/uL (ref 150–400)
RBC: 3.82 MIL/uL — ABNORMAL LOW (ref 3.87–5.11)
RDW: 13.3 % (ref 11.5–15.5)
WBC: 6.8 10*3/uL (ref 4.0–10.5)
nRBC: 0 % (ref 0.0–0.2)

## 2020-12-05 LAB — BASIC METABOLIC PANEL
Anion gap: 9 (ref 5–15)
BUN: 26 mg/dL — ABNORMAL HIGH (ref 8–23)
CO2: 23 mmol/L (ref 22–32)
Calcium: 9.6 mg/dL (ref 8.9–10.3)
Chloride: 111 mmol/L (ref 98–111)
Creatinine, Ser: 0.89 mg/dL (ref 0.44–1.00)
GFR, Estimated: >60 mL/min (ref >60)
Glucose, Bld: 114 mg/dL — ABNORMAL HIGH (ref 70–99)
Potassium: 3.9 mmol/L (ref 3.5–5.1)
Sodium: 143 mmol/L (ref 135–145)

## 2020-12-05 LAB — RAPID URINE DRUG SCREEN, HOSP PERFORMED
Amphetamines: NOT DETECTED
Barbiturates: NOT DETECTED
Benzodiazepines: NOT DETECTED
Cocaine: NOT DETECTED
Opiates: NOT DETECTED
Tetrahydrocannabinol: NOT DETECTED

## 2020-12-05 LAB — ETHANOL: Alcohol, Ethyl (B): <10 mg/dL (ref <10)

## 2020-12-05 MED ORDER — ACETAMINOPHEN 325 MG PO TABS
650.0000 mg | ORAL_TABLET | Freq: Once | ORAL | Status: AC
  Administered 2020-12-05: 650 mg via ORAL
  Filled 2020-12-05: qty 2

## 2020-12-05 MED ORDER — MELATONIN 3 MG PO TABS
3.0000 mg | ORAL_TABLET | Freq: Every day | ORAL | Status: DC
  Administered 2020-12-05 – 2020-12-06 (×2): 3 mg via ORAL
  Filled 2020-12-05 (×2): qty 1

## 2020-12-05 MED ORDER — ONDANSETRON HCL 4 MG PO TABS: 4.0000 mg | ORAL_TABLET | Freq: Three times a day (TID) | ORAL | Status: DC | PRN

## 2020-12-05 MED ORDER — ACETAMINOPHEN 325 MG PO TABS
650.0000 mg | ORAL_TABLET | ORAL | Status: DC | PRN
  Administered 2020-12-06: 650 mg via ORAL
  Filled 2020-12-05: qty 2

## 2020-12-05 NOTE — ED Triage Notes (Signed)
Pt arrived from home in police custody. Pt was IVC'd for claiming that she did not want to live anymore. Pt is distraught on arrival, calling the police officer a "hemmerhoid" and crying. Pt states that she knows she is mentally unstable, and that she has tried to get help but nobody helps her. Pt changed into purple scrubs and her belongings taken and labeled.

## 2020-12-05 NOTE — ED Notes (Signed)
Pt states that she wished that she fell off of her bed so that she would break her neck.

## 2020-12-05 NOTE — ED Notes (Signed)
Pt is begging Jesus to please take her life.

## 2020-12-05 NOTE — ED Provider Notes (Signed)
[facility] DEPT Provider Note   CSN: [id number] Arrival date & time: 12/05/20  1952     History Chief Complaint  Patient presents with  . involuntary commitment    [name] is a 64 y.o. female.  HPI Patient was talking to a mobile crisis worker and indicated that she wanted to kill herself so the crisis worker petitioned her, to protect her.  She was brought here by police.  Patient states that she is in "mental anguish."  She states that she is going through withdrawal from [place] after voluntarily weaning off of it.  She is not taking any depression medicine.  She was last hospitalized in September 2019 for generalized anxiety disorder and suicidal ideation.  At that time she discussed changing to a new psychiatry provider, a group that does not use Klonopin for treatment so was considering getting off the [place].  Patient states now that she does not know why she weaned herself off the Klonopin.  She states she is not taking any prescribed medicines at this time.  She denies any recent illnesses.  There are no other known modifying factors.  Past Medical History:  Diagnosis Date  . ADHD   . Anxiety   . Arthritis   . Carpal tunnel syndrome of left wrist   . Complication of anesthesia    states she had to much and once adn ended up on o2, no problems since  . Contracture of muscle of right upper arm    limited rom  . Cubital tunnel syndrome on left   . Depression   . Dyspnea    with activity  . GERD (gastroesophageal reflux disease)   . History of kidney stones   . History of suicide attempt 2012--  XANAX OVERDOSE  . Hypotension    reports that her SBP can run < 90  . Migraine   . Personality disorder ([place])   . Proximal humerus fracture    right  . UTI (urinary tract infection)    on cipro  . Vitamin B12 deficiency 03/26/2018    Patient Active Problem List   Diagnosis Date Noted  . Closed 3-part fracture of proximal  humerus 09/15/2019  . Proximal humerus fracture   . Vitamin B12 deficiency 03/26/2018  . Acute blood loss anemia 10/25/2016  . Primary osteoarthritis of right hip 10/08/2016  . Borderline personality disorder ([place]) 10/08/2016  . Migraines 10/08/2016  . Chronic back pain 10/08/2016  . Osteoarthritis of hips, bilateral 10/08/2016  . UTI (urinary tract infection) 07/19/2014  . Drug overdose 07/13/2014  . Acute respiratory failure with hypoxia ([place]) 07/13/2014  . Hypokalemia 07/13/2014  . Hypotension, unspecified 07/13/2014  . Suicidal ideations 07/13/2014    Past Surgical History:  Procedure Laterality Date  . CARPAL TUNNEL RELEASE Left 05/13/2013   Procedure: CARPAL TUNNEL RELEASE ENDOSCOPIC;  Surgeon: Jolyn Nap, MD;  Location: [facility];  Service: Orthopedics;  Laterality: Left;  Incision at 0955.   Marland Kitchen CERVICAL BIOPSY  W/ LOOP ELECTRODE EXCISION  12-01-2008   CIN II  . FEMORAL HERNIA REPAIR Left 10-31-2009   AND INGUINAL LYMPH NODE BX  . LUMBAR DISC SURGERY  X3   LAST ONE 1989   FUSION  . NERVE REPAIR Left 05/13/2013   Procedure: ULNAR NEUROPLASTY AT ELBOW;  Surgeon: Jolyn Nap, MD;  Location: [facility];  Service: Orthopedics;  Laterality: Left;  . ORIF HUMERUS FRACTURE Right 09/07/2019   Procedure: OPEN REDUCTION INTERNAL  FIXATION (ORIF) HUMERAL FRACTURE;  Surgeon: Renette Butters, MD;  Location: [facility];  Service: Orthopedics;  Laterality: Right;  . REPAIR PARTIAL FINGER AMPUTATION  1989  . TOTAL HIP ARTHROPLASTY Right 10/23/2016  . TOTAL HIP ARTHROPLASTY Right 10/23/2016   Procedure: TOTAL HIP ARTHROPLASTY ANTERIOR APPROACH;  Surgeon: Renette Butters, MD;  Location: [facility];  Service: Orthopedics;  Laterality: Right;     OB History   No obstetric history on file.     Family History  Problem Relation Age of Onset  . Breast cancer Mother     Social History   Tobacco Use  . Smoking status: Former Research scientist (life sciences)   . Smokeless tobacco: Never Used  . Tobacco comment: quit smoking 25 years ago  Vaping Use  . Vaping Use: Never used  Substance Use Topics  . Alcohol use: No  . Drug use: Yes    Types: Marijuana    Comment: > 1 mo ago    Home Medications Prior to Admission medications   Medication Sig Start Date End Date Taking? Authorizing Provider  acetaminophen (TYLENOL) 500 MG tablet Take 500 mg by mouth every 6 (six) hours as needed.    [provider]  ciprofloxacin (CIPRO) 500 MG tablet Take 500 mg by mouth 2 (two) times daily.    [provider]  clonazePAM (KLONOPIN) 1 MG tablet Take 0.5-2 mg by mouth 4 (four) times daily as needed for anxiety. 2 mg at bedtime (scheduled)    [provider]  Lidocaine 1.8 % PTCH Apply topically. 6 hrs on 6 hrs off    [provider]  nefazodone (SERZONE) 200 MG tablet Take 200 mg by mouth daily.     [provider]  promethazine (PHENERGAN) 25 MG tablet Take 1 tablet (25 mg total) by mouth every 6 (six) hours as needed for nausea or vomiting. 04/13/20   Ward, Delice Bison, DO  topiramate (TOPAMAX) 50 MG tablet Take 250 mg by mouth daily.    [provider]  traMADol (ULTRAM) 50 MG tablet Take by mouth every 6 (six) hours as needed.    [provider]  traZODone (DESYREL) 50 MG tablet Take 50 mg by mouth at bedtime.    [provider]  UNABLE TO FIND Dilaudid unknown mg, patient does niot know dose, only took 1 pill    [provider]  valACYclovir (VALTREX) 500 MG tablet Take 500 mg by mouth at bedtime.    [provider]    Allergies    Citalopram, Other, and Zocor [simvastatin]  Review of Systems   Review of Systems  All other systems reviewed and are negative.   Physical Exam Updated Vital Signs BP (!) 143/79   Pulse 93   Temp 98.4 F (36.9 C) (Oral)   Resp 18   Ht 5' (1.524 m)   Wt 42.6 kg   SpO2 98%   BMI 18.34 kg/m   Physical Exam Vitals and nursing  note reviewed.  Constitutional:      General: She is in acute distress (Uncomfortable).     Appearance: She is well-developed and well-nourished. She is not ill-appearing, toxic-appearing or diaphoretic.  HENT:     Head: Normocephalic and atraumatic.     Right Ear: External ear normal.     Left Ear: External ear normal.  Eyes:     Extraocular Movements: EOM normal.     Conjunctiva/sclera: Conjunctivae normal.     Pupils: Pupils are equal, round, and reactive  to light.  Neck:     Trachea: Phonation normal.  Cardiovascular:     Rate and Rhythm: Normal rate.  Pulmonary:     Effort: Pulmonary effort is normal. No respiratory distress.     Breath sounds: No stridor.  Chest:     Chest wall: No bony tenderness.  Abdominal:     General: There is no distension.  Musculoskeletal:        General: Normal range of motion.     Cervical back: Normal range of motion and neck supple.  Skin:    General: Skin is warm, dry and intact.  Neurological:     Mental Status: She is alert and oriented to person, place, and time.     Cranial Nerves: No cranial nerve deficit.     Sensory: No sensory deficit.     Motor: No abnormal muscle tone.     Coordination: Coordination normal.  Psychiatric:        Attention and Perception: She is inattentive. She does not perceive auditory or visual hallucinations.        Mood and Affect: Mood is anxious and depressed. Affect is labile and inappropriate.        Speech: She is communicative. Speech is rapid and pressured and tangential. Speech is not delayed or slurred.        Behavior: Behavior is not agitated, slowed, aggressive, withdrawn or hyperactive.        Thought Content: Thought content is not paranoid. Thought content includes suicidal ideation. Thought content does not include suicidal plan.        Cognition and Memory: Cognition is not impaired. Memory is not impaired.        Judgment: Judgment is impulsive and inappropriate.     ED Results /  Procedures / Treatments   Labs (all labs ordered are listed, but only abnormal results are displayed) Labs Reviewed  RESP PANEL BY RT-PCR (FLU A&B, COVID) ARPGX2  BASIC METABOLIC PANEL  ETHANOL  CBC WITH DIFFERENTIAL/PLATELET  RAPID URINE DRUG SCREEN, HOSP PERFORMED    EKG None  Radiology No results found.  Procedures Procedures (including critical care time)  Medications Ordered in ED Medications - No data to display  ED Course  I have reviewed the triage vital signs and the nursing notes.  Pertinent labs & imaging results that were available during my care of the patient were reviewed by me and considered in my medical decision making (see chart for details).  Clinical Course as of 12/05/20 2143  Mon Dec 05, 2020  2106 IVC petition; first examination upheld by me. [EW]    Clinical Course User Index [EW] Daleen Bo, MD   MDM Rules/Calculators/A&P                           Patient Vitals for the past 24 hrs:  BP Temp Temp src Pulse Resp SpO2 Height Weight  12/05/20 2022 -- -- -- -- -- -- 5' (1.524 m) 42.6 kg  12/05/20 2021 (!) 143/79 98.4 F (36.9 C) Oral 93 18 98 % -- --     Medical Decision Making:  This patient is presenting for evaluation of clinical evaluation for psychiatric purposes, which does require a range of treatment options, and is a complaint that involves a moderate risk of morbidity and mortality. The differential diagnoses include acute illness, intoxication, medication noncompliance. I decided to review old records, and in summary elderly female presenting for depression and suicidal  ideation, and medication noncompliance.  I did not require additional historical information from anyone.  Clinical Laboratory Tests Ordered, included CBC, Metabolic panel and UDS, Covid test.  Alcohol level review indicates essentially normal.   Critical Interventions-clinical evaluation, laboratory testing, consultation with TTS, home medications  initiated  After These Interventions, the Patient was reevaluated and was found to be a danger to herself.  IV petition upheld.  CRITICAL CARE-no Performed by: Daleen Bo  Nursing Notes Reviewed/ Care Coordinated Applicable Imaging Reviewed Interpretation of Laboratory Data incorporated into ED treatment  Patient medically cleared for treatment by psychiatry.   Final Clinical Impression(s) / ED Diagnoses Final diagnoses:  Depression, unspecified depression type  Suicidal ideation    Rx / DC Orders ED Discharge Orders    None       Daleen Bo, MD 12/05/20 2344

## 2020-12-05 NOTE — ED Notes (Signed)
Per pt's sister [name]: pt has BPD and has been able to function and live by herself. Pt then broke her shoulder and got kidney stones, and had her clonazepam discontinued. Pt thinks that she has been going through withdrawals per pt's sister. Pt's sister states that she has gotten progressively worse and she no longer takes any anti-depressants or anti-anxiety medications. Per pt's sister pt is not compliant with her medications.

## 2020-12-06 LAB — RESP PANEL BY RT-PCR (FLU A&B, COVID) ARPGX2
Influenza A by PCR: NEGATIVE
Influenza B by PCR: NEGATIVE
SARS Coronavirus 2 by RT PCR: NEGATIVE

## 2020-12-06 MED ORDER — LORAZEPAM 1 MG PO TABS
1.0000 mg | ORAL_TABLET | Freq: Once | ORAL | Status: DC
  Filled 2020-12-06: qty 1

## 2020-12-06 MED ORDER — ZIPRASIDONE MESYLATE 20 MG IM SOLR
20.0000 mg | Freq: Once | INTRAMUSCULAR | Status: AC
  Administered 2020-12-06: 03:00:00 10 mg via INTRAMUSCULAR
  Filled 2020-12-06: qty 20

## 2020-12-06 MED ORDER — STERILE WATER FOR INJECTION IJ SOLN
INTRAMUSCULAR | Status: AC
  Filled 2020-12-06: qty 10

## 2020-12-06 MED ORDER — ZIPRASIDONE MESYLATE 20 MG IM SOLR
10.0000 mg | Freq: Once | INTRAMUSCULAR | Status: AC
  Administered 2020-12-06: 23:00:00 10 mg via INTRAMUSCULAR
  Filled 2020-12-06: qty 20

## 2020-12-06 NOTE — ED Notes (Signed)
Patient very cooperative at this time. Patient states she would like to be in a room instead of the hallway. This RN explained to patient that we do not have an available room at this time and patient was understanding. Patient resting in bed comfortably with no complaints.

## 2020-12-06 NOTE — ED Notes (Addendum)
Pt refused PO ativan stating she is going through benzo withdrawal. Pt states she weaned herself off benzo's and has went 2 weeks without them. Pt sitting calmly on bed at this time.

## 2020-12-06 NOTE — ED Notes (Signed)
Pt very agitated and anxious, unwilling to sit down. Pt asked to please have a seat.

## 2020-12-06 NOTE — ED Notes (Signed)
Patient's sister picked up keys, glasses and cell phone per patient request.

## 2020-12-06 NOTE — BH Assessment (Addendum)
Assessment Note  [name] is an 64 y.o. female. Patient presents to [facility]. Per chart review: "Patient was talking to a mobile crisis worker and indicated that she wanted to kill herself so the crisis worker petitioned her, to protect her.  She was brought here by police.  Patient states that she is in "mental anguish."  She states that she is going through withdrawal from [place] after voluntarily weaning off of it.  She is not taking any depression medicine.  She was last hospitalized in September 2019 for generalized anxiety disorder and suicidal ideation.  At that time she discussed changing to a new psychiatry provider, a group that does not use Klonopin for treatment so was considering getting off the [place].  Patient states now that she does not know why she weaned herself off the Klonopin.  She states she is not taking any prescribed medicines at this time".   Clinician met with patient. The patient explains that she called mobile crisis  saying she wanted to get some counseling for anxiety.  The patient stated she started feeling overwhelmed. She has not been able to get in contact with her ex-spouse and/or sister. States that they haven't called to check on her and she is upset with their lack of support. Per patient, they both share dual rights as POA-financial/medical. Patient was apparently IVC'd for claiming that she did not want to live anymore. She denies current suicidal ideations. Stating that it's been months since she has experienced suicidal ideations. She has a prior history of suicide attempts (3x's). The suicide attempts were overdoses and she reports  taking an entire bottle of Klonopin. She has no recollection of when her last suicide attempt occurred. The past suicide attempts were triggered by "being tired of the world". She has a history of self mutilating behaviors such as pound on her chest and in the head. She last self mutilated in the 1990's. Current stressors:  "Being here, not having medication in system, noise, lights, and having a sitter talking to everyone but me". Patient stating that she wants to be ini the room alone and doesn't feel that she wants to be around people. Depression symptoms reported today: Feeling angry/irritable, Feeling worthless/self pity, Fatigue, Loss of interest in usual pleasures, Guilt, Isolating, Tearfulness, Insomnia, Despondent. She has severe anxiety. States that her appetite was poor but has improved. She states that she is gaining a few pounds because all she does is "eat, eat, eat". Her sleep habits are poor (1-3) hours per night. Denies HI and AVH's. Her support system is her cat. She lives alone. Patient is retired and receives medical disability. States, "I have a bad back". She reports occasional use of THC. She has been hospitalized at [facility] in the past for a suicide attempt and doesn't recall the date of her admission. She does not have a current outpatient therapist and/or psychiatrist. She was seen by psychiatrist- Dr. Kerin Salen in the past. However, states that she was terminated and doesn't know the reason.  Overall, patient states that she knows she is mentally unstable, and that she has tried to get help but nobody helps her.   Patient was oriented x4. Her speech is pressured at times. She was argumentative at the start of today's assessment. However, with encouragement became more calm and cooperative. Insight and judgement were poor. Impulse control is poor. Eye contact was poor. Mood was irritable and argumentative. Motor behavior was restless. She was guarded. Her thought process was  tangential. She required constant redirection to answer questions appropriately.     Diagnosis: Major Depressive Disorder, Recurrent   Past Medical History:  Past Medical History:  Diagnosis Date  . ADHD   . Anxiety   . Arthritis   . Carpal tunnel syndrome of left wrist   . Complication of anesthesia    states she  had to much and once adn ended up on o2, no problems since  . Contracture of muscle of right upper arm    limited rom  . Cubital tunnel syndrome on left   . Depression   . Dyspnea    with activity  . GERD (gastroesophageal reflux disease)   . History of kidney stones   . History of suicide attempt 2012--  XANAX OVERDOSE  . Hypotension    reports that her SBP can run < 90  . Migraine   . Personality disorder ([place])   . Proximal humerus fracture    right  . UTI (urinary tract infection)    on cipro  . Vitamin B12 deficiency 03/26/2018    Past Surgical History:  Procedure Laterality Date  . CARPAL TUNNEL RELEASE Left 05/13/2013   Procedure: CARPAL TUNNEL RELEASE ENDOSCOPIC;  Surgeon: Jolyn Nap, MD;  Location: [facility];  Service: Orthopedics;  Laterality: Left;  Incision at 0955.   Marland Kitchen CERVICAL BIOPSY  W/ LOOP ELECTRODE EXCISION  12-01-2008   CIN II  . FEMORAL HERNIA REPAIR Left 10-31-2009   AND INGUINAL LYMPH NODE BX  . LUMBAR DISC SURGERY  X3   LAST ONE 1989   FUSION  . NERVE REPAIR Left 05/13/2013   Procedure: ULNAR NEUROPLASTY AT ELBOW;  Surgeon: Jolyn Nap, MD;  Location: [facility];  Service: Orthopedics;  Laterality: Left;  . ORIF HUMERUS FRACTURE Right 09/11/2019   Procedure: OPEN REDUCTION INTERNAL FIXATION (ORIF) HUMERAL FRACTURE;  Surgeon: Renette Butters, MD;  Location: [place];  Service: Orthopedics;  Laterality: Right;  . REPAIR PARTIAL FINGER AMPUTATION  1989  . TOTAL HIP ARTHROPLASTY Right 10/23/2016  . TOTAL HIP ARTHROPLASTY Right 10/23/2016   Procedure: TOTAL HIP ARTHROPLASTY ANTERIOR APPROACH;  Surgeon: Renette Butters, MD;  Location: [place];  Service: Orthopedics;  Laterality: Right;    Family History:  Family History  Problem Relation Age of Onset  . Breast cancer Mother     Social History:  reports that she has quit smoking. She has never used smokeless tobacco. She reports current drug use.  Drug: Marijuana. She reports that she does not drink alcohol.  Additional Social History:  Alcohol / Drug Use Pain Medications: See MAR Prescriptions: See MAR Over the Counter: See MAR History of alcohol / drug use?: Yes Substance #1 Name of Substance 1: THC 1 - Age of First Use: "Many yrs ago" 1 - Amount (size/oz): "I don't smoke now" 1 - Frequency: "I don't smoke at all really" 1 - Duration: on-going 1 - Last Use / Amount: 1 week ago  CIWA: CIWA-Ar BP: 113/69 Pulse Rate: 86 COWS:    Allergies:  Allergies  Allergen Reactions  . Carbamazepine Other (See Comments) and Rash  . Tramadol Hcl Other (See Comments) and Nausea And Vomiting  . Vortioxetine Rash and Other (See Comments)    headache  . Citalopram     All SSRI's give her fever, insomnia  . Other     All antibiotics give her a yeast infection  . Zocor [Simvastatin] Anxiety    nervous  Home Medications: (Not in a hospital admission)   OB/GYN Status:  No LMP recorded. Patient is postmenopausal.  General Assessment Data Location of Assessment: [facility] ED TTS Assessment: In system Is this a Tele or Face-to-Face Assessment?: Tele Assessment Is this an Initial Assessment or a Re-assessment for this encounter?: Initial Assessment Patient Accompanied by::  Garment/textile technologist) Language Other than English: No Living Arrangements:  (alone in a town home) What gender do you identify as?: Female Date Telepsych consult ordered in [facility]:  (12/06/2020) Marital status: Divorced Elwin Sleight name:  Mendel Ryder) Pregnancy Status: No Living Arrangements: Alone Can pt return to current living arrangement?: Yes Admission Status:  ([facility]' IVC) Is patient capable of signing voluntary admission?: Yes Referral Source: Self/Family/Friend     Crisis Care Plan Living Arrangements: Alone Legal Guardian:  (no legal guardian) Name of Psychiatrist:  ("I use to but I don't now"; Dr. Kerin Salen) Name of Therapist:  (denies)  Education Status Is  patient currently in school?: No Is the patient employed, unemployed or receiving disability?: Unemployed,Receiving disability income ("I have retired". "I am out of medical disability due to medical issues with my back and migraine headaches".)  Risk to self with the past 6 months Suicidal Ideation: No-Not Currently/Within Last 6 Months ("Evreryone seems to think that I am"; "It's been months since I've had suicidal thoughts.") Has patient been a risk to self within the past 6 months prior to admission? : No Suicidal Intent: No Has patient had any suicidal intent within the past 6 months prior to admission? : No Is patient at risk for suicide?: No Suicidal Plan?: No Has patient had any suicidal plan within the past 6 months prior to admission? : No Access to Means: No What has been your use of drugs/alcohol within the last 12 months?:  (THC) Previous Attempts/Gestures: Yes How many times?:  (3x's- overdosed taking an entire bottle of Klonopin (last suicide attempt is unknown)) Other Self Harm Risks:  ("I have beat the cramp out of myself"; last time was early 46's. "I use to also pound on my chest.) Triggers for Past Attempts: Other (Comment) ("I was tired of the world") Intentional Self Injurious Behavior:  (hitting self in the chest) Family Suicide History: No Recent stressful life event(s):  ("Being here, not having medication in system, noise, light, having a sitter talking to all the people in the room". Patient stating that she wants to be ini the room) Persecutory voices/beliefs?: No Depression: Yes Depression Symptoms: Feeling angry/irritable,Feeling worthless/self pity,Fatigue,Loss of interest in usual pleasures,Guilt,Isolating,Tearfulness,Insomnia,Despondent Substance abuse history and/or treatment for substance abuse?: No Suicide prevention information given to non-admitted patients: Not applicable  Risk to Others within the past 6 months Homicidal Ideation: No Does patient  have any lifetime risk of violence toward others beyond the six months prior to admission? : No Thoughts of Harm to Others: No Current Homicidal Intent: No Current Homicidal Plan: No Access to Homicidal Means: No Identified Victim:  (n/a) History of harm to others?: No Assessment of Violence: None Noted Violent Behavior Description:  (currently angery and irritable) Does patient have access to weapons?: No Does patient have a court date: No Is patient on probation?: No  Psychosis Hallucinations: None noted Delusions: None noted  Mental Status Report Appearance/Hygiene: In scrubs Eye Contact: Fair Motor Activity: Restlessness Speech: Argumentative Level of Consciousness: Alert,Irritable Mood: Depressed,Angry,Apprehensive,Irritable,Preoccupied Affect: Irritable,Apprehensive Anxiety Level: Severe Thought Processes: Relevant Judgement: Impaired Orientation: Person,Time,Situation,Place Obsessive Compulsive Thoughts/Behaviors: None  Cognitive Functioning Concentration: Decreased Memory: Recent Intact,Remote Intact Is patient IDD:  No Insight: Poor Impulse Control: Poor Appetite: Fair Have you had any weight changes? : Loss Amount of the weight change? (lbs):  ("I loss alot of weight at one time because of no appetitie. Now, I am gainig weight because all I want to do is eat, eat, eat".) Sleep: Decreased Total Hours of Sleep:  (1-3 hrs per night) Vegetative Symptoms: None  ADLScreening [facility] Assessment Services) Patient's cognitive ability adequate to safely complete daily activities?: Yes Patient able to express need for assistance with ADLs?: Yes Independently performs ADLs?: Yes (appropriate for developmental age)  Prior Inpatient Therapy Prior Inpatient Therapy: Yes Prior Therapy Dates:  ("I don't remember when that was") Prior Therapy Facilty/Provider(s):  ([facility]) Reason for Treatment:  (suicide attempt)  Prior Outpatient Therapy Prior Outpatient Therapy:  Yes Prior Therapy Dates:  (past- "Dr. Kerin Salen terminated me from her practice"; Patient doesn't know the last time she saw the provider.) Prior Therapy Facilty/Provider(s):  ("I use to but I don't now"; Dr. Kerin Salen) Reason for Treatment:  ("I couldn't sleep") Does patient have an ACCT team?: No Does patient have Intensive In-House Services?  : No Does patient have [facility] services? : No Does patient have P4CC services?: No  ADL Screening (condition at time of admission) Patient's cognitive ability adequate to safely complete daily activities?: Yes Patient able to express need for assistance with ADLs?: Yes Independently performs ADLs?: Yes (appropriate for developmental age)       Abuse/Neglect Assessment (Assessment to be complete while patient is alone) Physical Abuse: Denies Verbal Abuse: Denies Sexual Abuse: Denies Exploitation of patient/patient's resources: Denies Self-Neglect: Denies     Regulatory affairs officer (For Healthcare) Does Patient Have a Medical Advance Directive?: No Would patient like information on creating a medical advance directive?: No - Patient declined          Disposition: Letitia Libra, NP, recommends [facility] psych treatment. Disposition Counselor to seek appropriate placement.  Disposition Initial Assessment Completed for this Encounter: Yes  On Site Evaluation by:   Reviewed with Physician:    Waldon Merl 12/06/2020 11:16 AM

## 2020-12-06 NOTE — ED Notes (Signed)
Patient is agitated and attempting to walk around and being disruptive to other patient's.

## 2020-12-06 NOTE — ED Notes (Signed)
TTS set up at bedside. 

## 2020-12-06 NOTE — ED Notes (Signed)
Patient given breakfast tray.

## 2020-12-06 NOTE — ED Notes (Signed)
Sitter bedside with patient.

## 2020-12-07 ENCOUNTER — Encounter (HOSPITAL_COMMUNITY): Payer: Self-pay | Admitting: Registered Nurse

## 2020-12-07 ENCOUNTER — Telehealth (HOSPITAL_COMMUNITY): Payer: Self-pay | Admitting: Psychiatry

## 2020-12-07 DIAGNOSIS — R45851 Suicidal ideations: Secondary | ICD-10-CM | POA: Insufficient documentation

## 2020-12-07 DIAGNOSIS — F411 Generalized anxiety disorder: Secondary | ICD-10-CM

## 2020-12-07 DIAGNOSIS — F603 Borderline personality disorder: Secondary | ICD-10-CM

## 2020-12-07 NOTE — ED Provider Notes (Signed)
Emergency Medicine Observation Re-evaluation Note  [name] is a 64 y.o. female, seen on rounds today.  Pt initially presented to the ED for complaints of involuntary commitment Currently, the patient is feeling better.  Physical Exam  BP (!) 108/57 (BP Location: Left Arm)    Pulse 81    Temp 98.8 F (37.1 C) (Oral)    Resp 16    Ht 5' (1.524 m)    Wt 42.6 kg    SpO2 100%    BMI 18.34 kg/m  Physical Exam General: NAD Cardiac: regular rate Lungs: no acute resp distress  Psych: feeling better no SI  ED Course / MDM  EKG:  Clinical Course as of 12/07/20 1401  Mon Dec 05, 2020  2106 IVC petition; first examination upheld by me. [EW]    Clinical Course User Index [EW] Daleen Bo, MD   I have reviewed the labs performed to date as well as medications administered while in observation.  Recent changes in the last 24 hours include improvement mood.  Plan  Current plan is for IVC to be rescinded and pt be allowed to go home and f/u as outpt. Patient is under full IVC at this time.   Blanchie Dessert, MD 12/07/20 [id number]

## 2020-12-07 NOTE — Consult Note (Signed)
Telepsych Consultation   Reason for Consult:  IVC; suicidal ideation Referring Physician:  Daleen Bo, MD Location of Patient: [facility] ED Location of Provider: Other: [facility]  Patient Identification: [name] MRN:  [id number] Principal Diagnosis: GAD (generalized anxiety disorder) Diagnosis:  Principal Problem:   GAD (generalized anxiety disorder) Active Problems:   Suicidal ideations   Borderline personality disorder ([place])   Total Time spent with patient: 45 minutes  Subjective:   [name] is a 64 y.o. female patient admitted to [facility] ED under IVC via mobile crisis related to patient stating she did not want to live anymore.  HPI:  [name], 64 y.o., female patient seen via tele psych by this provider, consulted with Dr. Serafina Mitchell; and chart reviewed on 12/07/20.  On evaluation [name] reports she is feeling fine ans that she doesn't need to be in the hospital.  "I called mobile crisis cause I wanted to get information on getting into a psychiatric facility cause I felt like I was going through withdrawal.  I took my last Klonopin the last of November and my anxiety has been worse every since.  I don't know where this suicidal came from but I never told anybody that I wanted to kill myself.  Yes I have a history but I am not suicidal.  I just need to get set up with a psychiatrist."  Patient states that she lives a lone but lives close to her sister whom she speaks with everyday.  Patient states that she is able to care for herself at home.  Patient gave permission to speak to her sister [name] at [phone] for collateral information. During evaluation [name] is alert/oriented x 4; calm/cooperative;her mood is agitated related to being in the hospital and affect is congruent with mood.  She does not appear to be responding to internal/external stimuli or delusional thoughts.  Patient denies suicidal/self-harm/homicidal  ideation, psychosis, and paranoia.  Patient answered question appropriately.  Collateral Information:  Spoke with patients sister [name] [phone]):  Ms. Caprice Beaver states that he sister has a history of borderline personality disorder and she can test the nerves of "Jesus Christ believe me but I don't think she is a danger to herself.  She is always going on about something and when ever she is up set she will make statements like I wish I had a gun or I wish I was dead; but she doesn't really mean it.  She wants a lot of attention and she is always needing something."  Ms. Caprice Beaver states that patient has outpatient psychiatric services with Dr. Toy Care but there was always a problem with medications and with last visit antidepressant was discontinued and Klonopin was stopped "I thing Dr. Toy Care just got fed up with her and all of the changes.  I do know that she needs some where to follow up with."  She also states "I am married and have 2 grandchildren that I look after everyday.  Jarelyn gets upset because she wants me to spend time with her than with my grandchildren.  I have set boundaries with her and I have to stick to them; but if she thinks I'm going to be over there everyday she needs to know it won't be happening.  She wants to move in with me; if I let her do that my husband will leave and I'll need to be put in a psychiatric institution."  Ms. Caprice Beaver states she is able to  pick up patient up later this evening (after 5 pm or later) or she can unlock the door to patients house if she is able to get home another way.     Past Psychiatric History: See above  Risk to Self: Suicidal Ideation: No-Not Currently/Within Last 6 Months ("Evreryone seems to think that I am"; "It's been months since I've had suicidal thoughts.") Suicidal Intent: No Is patient at risk for suicide?: No Suicidal Plan?: No Access to Means: No What has been your use of drugs/alcohol within the last 12 months?:   (THC) How many times?:  (3x's- overdosed taking an entire bottle of Klonopin (last suicide attempt is unknown)) Other Self Harm Risks:  ("I have beat the cramp out of myself"; last time was early 60's. "I use to also pound on my chest.) Triggers for Past Attempts: Other (Comment) ("I was tired of the world") Intentional Self Injurious Behavior:  (hitting self in the chest) Risk to Others: Homicidal Ideation: No Thoughts of Harm to Others: No Current Homicidal Intent: No Current Homicidal Plan: No Access to Homicidal Means: No Identified Victim:  (n/a) History of harm to others?: No Assessment of Violence: None Noted Violent Behavior Description:  (currently angery and irritable) Does patient have access to weapons?: No Does patient have a court date: No Prior Inpatient Therapy: Prior Inpatient Therapy: Yes Prior Therapy Dates:  ("I don't remember when that was") Prior Therapy Facilty/Provider(s):  ([facility]) Reason for Treatment:  (suicide attempt) Prior Outpatient Therapy: Prior Outpatient Therapy: Yes Prior Therapy Dates:  (past- "Dr. Kerin Salen terminated me from her practice"; Patient doesn't know the last time she saw the provider.) Prior Therapy Facilty/Provider(s):  ("I use to but I don't now"; Dr. Kerin Salen) Reason for Treatment:  ("I couldn't sleep") Does patient have an ACCT team?: No Does patient have Intensive In-House Services?  : No Does patient have [facility] services? : No Does patient have P4CC services?: No  Past Medical History:  Past Medical History:  Diagnosis Date  . ADHD   . Anxiety   . Arthritis   . Carpal tunnel syndrome of left wrist   . Complication of anesthesia    states she had to much and once adn ended up on o2, no problems since  . Contracture of muscle of right upper arm    limited rom  . Cubital tunnel syndrome on left   . Depression   . Dyspnea    with activity  . GERD (gastroesophageal reflux disease)   . History of kidney  stones   . History of suicide attempt 2012--  XANAX OVERDOSE  . Hypotension    reports that her SBP can run < 90  . Migraine   . Personality disorder ([place])   . Proximal humerus fracture    right  . UTI (urinary tract infection)    on cipro  . Vitamin B12 deficiency 03/26/2018    Past Surgical History:  Procedure Laterality Date  . CARPAL TUNNEL RELEASE Left 05/13/2013   Procedure: CARPAL TUNNEL RELEASE ENDOSCOPIC;  Surgeon: Jolyn Nap, MD;  Location: [facility];  Service: Orthopedics;  Laterality: Left;  Incision at 0955.   Marland Kitchen CERVICAL BIOPSY  W/ LOOP ELECTRODE EXCISION  12-01-2008   CIN II  . FEMORAL HERNIA REPAIR Left 10-31-2009   AND INGUINAL LYMPH NODE BX  . LUMBAR DISC SURGERY  X3   LAST ONE 1989   FUSION  . NERVE REPAIR Left 05/13/2013   Procedure: ULNAR NEUROPLASTY AT  ELBOW;  Surgeon: Jolyn Nap, MD;  Location[facility];  Service: Orthopedics;  Laterality: Left;  . ORIF HUMERUS FRACTURE Right 09/07/2019   Procedure: OPEN REDUCTION INTERNAL FIXATION (ORIF) HUMERAL FRACTURE;  Surgeon: Renette Butters, MD;  Location: [place];  Service: Orthopedics;  Laterality: Right;  . REPAIR PARTIAL FINGER AMPUTATION  1989  . TOTAL HIP ARTHROPLASTY Right 10/23/2016  . TOTAL HIP ARTHROPLASTY Right 10/23/2016   Procedure: TOTAL HIP ARTHROPLASTY ANTERIOR APPROACH;  Surgeon: Renette Butters, MD;  Location: [place];  Service: Orthopedics;  Laterality: Right;   Family History:  Family History  Problem Relation Age of Onset  . Breast cancer Mother    Family Psychiatric  History: None reported Social History:  Social History   Substance and Sexual Activity  Alcohol Use No     Social History   Substance and Sexual Activity  Drug Use Yes  . Types: Marijuana   Comment: > 1 mo ago    Social History   Socioeconomic History  . Marital status: Divorced    Spouse name: Not on file  . Number of children: Not on file  . Years of  education: Not on file  . Highest education level: Not on file  Occupational History  . Not on file  Tobacco Use  . Smoking status: Former Research scientist (life sciences)  . Smokeless tobacco: Never Used  . Tobacco comment: quit smoking 25 years ago  Vaping Use  . Vaping Use: Never used  Substance and Sexual Activity  . Alcohol use: No  . Drug use: Yes    Types: Marijuana    Comment: > 1 mo ago  . Sexual activity: Not on file  Other Topics Concern  . Not on file  Social History Narrative   Lives alone   Caffeine use: sometimes soda   Right handed    Social Determinants of Health   Financial Resource Strain: Not on file  Food Insecurity: Not on file  Transportation Needs: Not on file  Physical Activity: Not on file  Stress: Not on file  Social Connections: Not on file   Additional Social History:    Allergies:   Allergies  Allergen Reactions  . Carbamazepine Other (See Comments) and Rash  . Tramadol Hcl Other (See Comments) and Nausea And Vomiting  . Vortioxetine Rash and Other (See Comments)    headache  . Citalopram     All SSRI's give her fever, insomnia  . Other     All antibiotics give her a yeast infection  . Zocor [Simvastatin] Anxiety    nervous    Labs:  Results for orders placed or performed during the hospital encounter of 12/05/20 (from the past 48 hour(s))  Resp Panel by RT-PCR (Flu A&B, Covid) Nasopharyngeal Swab     Status: None   Collection Time: 12/05/20  9:02 PM   Specimen: Nasopharyngeal Swab; Nasopharyngeal(NP) swabs in vial transport medium  Result Value Ref Range   SARS Coronavirus 2 by RT PCR NEGATIVE NEGATIVE    Comment: (NOTE) SARS-CoV-2 target nucleic acids are NOT DETECTED.  The SARS-CoV-2 RNA is generally detectable in upper respiratory specimens during the acute phase of infection. The lowest concentration of SARS-CoV-2 viral copies this assay can detect is 138 copies/mL. A negative result does not preclude SARS-Cov-2 infection and should not be used  as the sole basis for treatment or other patient management decisions. A negative result may occur with  improper specimen collection/handling, submission of specimen other than  nasopharyngeal swab, presence of viral mutation(s) within the areas targeted by this assay, and inadequate number of viral copies(<138 copies/mL). A negative result must be combined with clinical observations, patient history, and epidemiological information. The expected result is Negative.  Fact Sheet for Patients:  EntrepreneurPulse.com.au  Fact Sheet for Healthcare Providers:  IncredibleEmployment.be  This test is no t yet approved or cleared by the [place] FDA and  has been authorized for detection and/or diagnosis of SARS-CoV-2 by FDA under an Emergency Use Authorization (EUA). This EUA will remain  in effect (meaning this test can be used) for the duration of the COVID-19 declaration under Section 564(b)(1) of the Act, 21 U.S.C.section 360bbb-3(b)(1), unless the authorization is terminated  or revoked sooner.       Influenza A by PCR NEGATIVE NEGATIVE   Influenza B by PCR NEGATIVE NEGATIVE    Comment: (NOTE) The Xpert Xpress SARS-CoV-2/FLU/RSV plus assay is intended as an aid in the diagnosis of influenza from Nasopharyngeal swab specimens and should not be used as a sole basis for treatment. Nasal washings and aspirates are unacceptable for Xpert Xpress SARS-CoV-2/FLU/RSV testing.  Fact Sheet for Patients: EntrepreneurPulse.com.au  Fact Sheet for Healthcare Providers: IncredibleEmployment.be  This test is not yet approved or cleared by the [place] FDA and has been authorized for detection and/or diagnosis of SARS-CoV-2 by FDA under an Emergency Use Authorization (EUA). This EUA will remain in effect (meaning this test can be used) for the duration of the COVID-19 declaration under Section 564(b)(1) of the Act,  21 U.S.C. section 360bbb-3(b)(1), unless the authorization is terminated or revoked.  Performed at [facility], [place] [address], [place], [place] [address]   Basic metabolic panel     Status: Abnormal   Collection Time: 12/05/20  9:20 PM  Result Value Ref Range   Sodium 143 135 - 145 mmol/L   Potassium 3.9 3.5 - 5.1 mmol/L   Chloride 111 98 - 111 mmol/L   CO2 23 22 - 32 mmol/L   Glucose, Bld 114 (H) 70 - 99 mg/dL    Comment: Glucose reference range applies only to samples taken after fasting for at least 8 hours.   BUN 26 (H) 8 - 23 mg/dL   Creatinine, Ser 0.89 0.44 - 1.00 mg/dL   Calcium 9.6 8.9 - 10.3 mg/dL   GFR, Estimated >60 >60 mL/min    Comment: (NOTE) Calculated using the CKD-EPI Creatinine Equation (2021)    Anion gap 9 5 - 15    Comment: Performed at [facility], [place] [address] [place], [place] [address]  Ethanol     Status: None   Collection Time: 12/05/20  9:20 PM  Result Value Ref Range   Alcohol, Ethyl (B) <10 <10 mg/dL    Comment: (NOTE) Lowest detectable limit for serum alcohol is 10 mg/dL.  For medical purposes only. Performed at [facility], [place] [address] [place], [place] [address]   CBC with Differential     Status: Abnormal   Collection Time: 12/05/20  9:20 PM  Result Value Ref Range   WBC 6.8 4.0 - 10.5 K/uL   RBC 3.82 (L) 3.87 - 5.11 MIL/uL   Hemoglobin 12.9 12.0 - 15.0 g/dL   HCT 40.4 36.0 - 46.0 %   MCV 105.8 (H) 80.0 - 100.0 fL   MCH 33.8 26.0 - 34.0 pg   MCHC 31.9 30.0 - 36.0 g/dL   RDW 13.3 11.5 - 15.5 %   Platelets 178  150 - 400 K/uL   nRBC 0.0 0.0 - 0.2 %   Neutrophils Relative % 49 %   Neutro Abs 3.3 1.7 - 7.7 K/uL   Lymphocytes Relative 33 %   Lymphs Abs 2.3 0.7 - 4.0 K/uL   Monocytes Relative 11 %   Monocytes Absolute 0.7 0.1 - 1.0 K/uL   Eosinophils Relative 6 %   Eosinophils Absolute 0.4 0.0 - 0.5 K/uL   Basophils Relative 1 %   Basophils Absolute 0.1 0.0 - 0.1  K/uL   Immature Granulocytes 0 %   Abs Immature Granulocytes 0.01 0.00 - 0.07 K/uL    Comment: Performed at [facility], [place] [address] [place], [place] [address]  Urine rapid drug screen (hosp performed)     Status: None   Collection Time: 12/05/20 10:23 PM  Result Value Ref Range   Opiates NONE DETECTED NONE DETECTED   Cocaine NONE DETECTED NONE DETECTED   Benzodiazepines NONE DETECTED NONE DETECTED   Amphetamines NONE DETECTED NONE DETECTED   Tetrahydrocannabinol NONE DETECTED NONE DETECTED   Barbiturates NONE DETECTED NONE DETECTED    Comment: (NOTE) DRUG SCREEN FOR MEDICAL PURPOSES ONLY.  IF CONFIRMATION IS NEEDED FOR ANY PURPOSE, NOTIFY LAB WITHIN 5 DAYS.  LOWEST DETECTABLE LIMITS FOR URINE DRUG SCREEN Drug Class                     Cutoff (ng/mL) Amphetamine and metabolites    1000 Barbiturate and metabolites    200 Benzodiazepine                 470 Tricyclics and metabolites     300 Opiates and metabolites        300 Cocaine and metabolites        300 THC                            50 Performed at [facility], [place] [address] [place],  [address]     Medications:  Current Facility-Administered Medications  Medication Dose Route Frequency Provider Last Rate Last Admin  . acetaminophen (TYLENOL) tablet 650 mg  650 mg Oral Q4H PRN Daleen Bo, MD   650 mg at 12/06/20 2248  . LORazepam (ATIVAN) tablet 1 mg  1 mg Oral Once [place] Ferguson, MD      . melatonin tablet 3 mg  3 mg Oral QHS Daleen Bo, MD   3 mg at 12/06/20 2248  . ondansetron (ZOFRAN) tablet 4 mg  4 mg Oral Q8H PRN Daleen Bo, MD       Current Outpatient Medications  Medication Sig Dispense Refill  . acetaminophen (TYLENOL) 500 MG tablet Take 500 mg by mouth every 6 (six) hours as needed.    Marland Kitchen alendronate (FOSAMAX) 70 MG tablet Take 70 mg by mouth once a week.    . gabapentin (NEURONTIN) 100 MG capsule Take 100 mg by mouth in the morning and at  bedtime. X 10 days    . Lidocaine 4 % PTCH Apply 1 patch topically daily as needed (shoulder pain). 6 hrs on 6 hrs off    . melatonin 3 MG TABS tablet Take 3 mg by mouth at bedtime.    Marland Kitchen omeprazole (PRILOSEC) 20 MG capsule Take 20 mg by mouth at bedtime.    . promethazine (PHENERGAN) 25 MG tablet Take 1 tablet (25 mg total) by mouth every 6 (six) hours as needed for nausea  or vomiting. 15 tablet 0  . topiramate (TOPAMAX) 50 MG tablet Take 250 mg by mouth daily.    . valACYclovir (VALTREX) 500 MG tablet Take 500 mg by mouth at bedtime.      Musculoskeletal: Strength & Muscle Tone: within normal limits Gait & Station: normal Patient leans: N/A  Psychiatric Specialty Exam: Physical Exam Vitals and nursing note reviewed. Exam conducted with a chaperone present (sitter at bedside).  Constitutional:      Appearance: Normal appearance.  Cardiovascular:     Rate and Rhythm: Normal rate.  Pulmonary:     Effort: Pulmonary effort is normal.  Musculoskeletal:        General: Normal range of motion.     Cervical back: Normal range of motion.  Neurological:     Mental Status: She is alert.  Psychiatric:        Attention and Perception: Attention and perception normal. She does not perceive auditory or visual hallucinations.        Mood and Affect: Affect normal. Mood is anxious.        Speech: Speech normal.        Behavior: Behavior is agitated. Behavior is cooperative.        Thought Content: Thought content normal. Thought content is not paranoid or delusional. Thought content does not include homicidal or suicidal ideation.        Cognition and Memory: Cognition and memory normal.        Judgment: Judgment normal.     Review of Systems  Psychiatric/Behavioral: Negative for confusion, hallucinations, self-injury and suicidal ideas. Agitation: Related to being in the hospital. Sleep disturbance: States she has not been able to sleep while in hospital because she was in the hallway.  Nervous/anxious: Stable.   All other systems reviewed and are negative.   Blood pressure 116/74, pulse 66, temperature 97.6 F (36.4 C), temperature source Oral, resp. rate 17, height 5' (1.524 m), weight 42.6 kg, SpO2 100 %.Body mass index is 18.34 kg/m.  General Appearance: Casual  Eye Contact:  Good  Speech:  Clear and Coherent and Normal Rate  Volume:  Normal  Mood:  Irritable  Affect:  Congruent  Thought Process:  Coherent, Goal Directed and Descriptions of Associations: Intact  Orientation:  Full (Time, Place, and Person)  Thought Content:  WDL and Logical  Suicidal Thoughts:  No  Homicidal Thoughts:  No  Memory:  Immediate;   Good Recent;   Good  Judgement:  Intact  Insight:  Present  Psychomotor Activity:  Normal  Concentration:  Concentration: Good and Attention Span: Good  Recall:  Good  Fund of Knowledge:  Good  Language:  Good  Akathisia:  No  Handed:  Right  AIMS (if indicated):     Assets:  Communication Skills Desire for Improvement Housing Physical Health Resilience Social Support  ADL's:  Intact  Cognition:  WNL  Sleep:      Treatment Plan Summary: Plan Psychiatrically clear   Recommendation:  Patient to be set up with outpatient psychiatric services (IOP or PHP) if affordable.  Will order social work consult to assist with patient getting transportation home.    Disposition: No evidence of imminent risk to self or others at present.   Patient does not meet criteria for psychiatric inpatient admission. Supportive therapy provided about ongoing stressors. Refer to IOP. Discussed crisis plan, support from social network, calling 911, coming to the Emergency Department, and calling Suicide Hotline.  This service was provided via telemedicine using  a 2-way, interactive audio and Radiographer, therapeutic.  Names of all persons participating in this telemedicine service and their role in this encounter. Name: Earleen Newport Role: NP  Name: Dr. Serafina Mitchell Role:  Psychiatrist  Name: [name] Role: Patient  Name: [name] Role: Patients sister   Secure message sent to Dr. Maryan Rued informing:  Patient has been psychiatrically cleared.  Gershon Mussel is setting up outpatient psychiatric services for patient to follow up with.  Social work ordered to assist patient with transportation home.       Sophiea Ueda, NP 12/07/2020 10:59 AM

## 2020-12-07 NOTE — ED Notes (Signed)
Patient given clothes from the clothing closet so as she does not have to go home in a robe. Patient's robe and shoes returned to patient.

## 2020-12-07 NOTE — BH Assessment (Signed)
[place] Assessment Progress Note  Per Shuvon Rankin, NP, this pt does not require psychiatric hospitalization at this time.  Pt presents under IVC initiated by the mobile crisis team and upheld by EDP Daleen Bo, MD, which has been rescinded by EDP Blanchie Dessert, MD.  Pt is psychiatrically cleared.  Pt reportedly needs intake appointments for outpatient psychiatry and therapy.  After making numerous calls to area providers, this Probation officer reached Daleen Snook at the [facility] at [place] at 13:24.  She has scheduled pt for virtual appointments with Merian Capron, MD on Tuesday, 01/10/21 at 11:00; and with Cordella Register, LCSW on Wednesday, 01/25/21 at 10:00 am.  These have been included in pt's discharge instructions.  I then spoke to the pt.  She reports concerns about the financial burden that she may face with these appointments.  I informed her that this was the most cost sensitive option that I could find, but advised her to call the provider well in advance to discuss cost.  I informed her that both appointments are virtual, and she replied that she not only has technology available to support virtual programming, but that this is her preferred means.  She provided me with her phone number and e-mail address which I have since forwarded to [place].  I also gave pt a registration packet for the Outpatient Clinic and asked the pt to complete it prior to the first appointment. At [facility] request, I informed pt that she should call her sister before leaving [facility] to arrange for the sister to unlock the door to her home before pt's arrival.  Pt may need transportation assistance to return home.  Dr Maryan Rued and pt's nurse, Fara Chute, have been notified.  Jalene Mullet, [place] Triage Specialist [phone]

## 2020-12-07 NOTE — ED Notes (Signed)
Patient complaining of rash on upper chest-no overt signs of a rash, no redness or raised bumps-will continue to monitor

## 2020-12-07 NOTE — Discharge Instructions (Signed)
For your behavioral health needs, you are advised to follow up with an outpatient therapist and psychiatrist.  You are scheduled for the following VIRTUAL intake appointments through the [facility] [place].  Please complete the registration form given to you by Emergency Department staff prior to the first appointment:  PSYCHIATRY:      Merian Capron, MD      Tuesday, January 10, 2021 at 11:00 am  THERAPY:      Cordella Register, LCSW      Wednesday, January 25, 2021 at 10:00 am       [facility] [address]      [place], [place] [address]      [phone]

## 2020-12-07 NOTE — Telephone Encounter (Signed)
[name] called back.  She seemed stable, but did look tearful. She was eating dinner.  [name] offered to take her to [facility], but patient refused at this time due to being extremely tired from being at [facility]. Officer states she was not SI or HI. She did request from the officer that she would like a sooner apt, but not at this time.   [name], is there any office that could get her in any sooner? Maybe that could do a virtual visit, either by Webex or some other way as transportation seems to be an issue for this patient.  She will need Therapy as well.   Let me know if there is anything I can do to help.  Thank You

## 2020-12-07 NOTE — ED Notes (Signed)
Patient's sister  [phone]

## 2020-12-07 NOTE — Telephone Encounter (Signed)
Patient called- She has explained to me what horrible experience she had at [facility]. She has an apt with Dr. De Nurse on 1/18 and "does not know if she can wait that long to be seen." We do not have any sooner openings at this time. She has been placed on a Cancellation list.  She seemed manic and crying while talking. She does not have anyone in the house with her. Her and her Husband have not agreed, or faught about some things recently and not living with her. She says she "is not SI, and does not have a plan but does not know how she is going to make it". She has not slept and can not function.  I was explaining to her that she can go to [facility] to be seen and they would see her 24-7. She states she "does not have a ride and no one will take her." She "has a sister but her sister does not love her." I explained that if she could just call her sister and explain what she was going through, I am sure that her sister would take her. She states her sister has her hands full enough.  She states she has tried all the medications in the book and none of them has worked. Many has caused renal failure. 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 (she is coming off of Klonopin)  After explaining again about [place] she seemed hopeful in going but does not have a ride.  She states and very bluntly and forcefully she will NOT go to [facility] again. They gave her medications she does not want or need.  The phone was then disconnected.   I do have concerns about patients safety. I do not feel like she is safe at home alone.  Tried to call patient back x3.   [facility] non-Emergent Line.  Explained the situation above. They will send a sheriffs out to patients home for a safety check.

## 2020-12-07 NOTE — Progress Notes (Signed)
..   Transition of Care [facility]) - Emergency Department Mini Assessment   Patient Details  Name: [name] MRN: [id number] Date of Birth: 01-03-1956  Transition of Care [facility]) CM/SW Contact:    Verba Ainley C Tarpley-Carter, Cable Phone Number: 12/07/2020, 3:01 PM   Clinical Narrative: [facility] CM/CSW consulted with pt about her need for transportation.  Pt signed "Rider's Waiver".  Transportation was called and faxed info needed for transportation.  Latiya Navia Tarpley-Carter, MSW, LCSW-A Pronouns:  She, Her, Hers                  [facility] ED Transitions of CareClinical Social Worker [email or web address] [phone]   ED Mini Assessment:    Barriers to Discharge: No Barriers Identified     Means of departure: Car  Interventions which prevented an admission or readmission: Transportation Screening    Patient Contact and Communications Key Contact 1: [place] with: Orlando Penner Contact Date: 12/07/20,   Contact time: 0200 Contact Phone Number: [phone] Call outcome: Reino Bellis will unlock pts door, so she can access her home.  Patient states their goals for this hospitalization and ongoing recovery are:: To get transportation home.   Choice offered to / list presented to : Patient  Admission diagnosis:  IVC Patient Active Problem List   Diagnosis Date Noted  . GAD (generalized anxiety disorder) 12/07/2020  . Suicidal ideation   . Closed 3-part fracture of proximal humerus 08/26/2019  . Proximal humerus fracture   . Vitamin B12 deficiency 03/26/2018  . Acute blood loss anemia 10/25/2016  . Primary osteoarthritis of right hip 10/08/2016  . Borderline personality disorder ([place]) 10/08/2016  . Migraines 10/08/2016  . Chronic back pain 10/08/2016  . Osteoarthritis of hips, bilateral 10/08/2016  . UTI (urinary tract infection) 07/19/2014  . Drug overdose 07/13/2014  . Acute respiratory failure with hypoxia ([place])  07/13/2014  . Hypokalemia 07/13/2014  . Hypotension, unspecified 07/13/2014  . Suicidal ideations 07/13/2014   PCP:  Harlan Stains, MD Pharmacy:   [facility], [address] [place] [address] Phone: [phone] Fax: [phone]

## 2020-12-07 NOTE — ED Notes (Signed)
[facility] [employer] called to take patient back home as she was brought in under IVC and has no way home. Patient informed of plan of care.

## 2020-12-13 DIAGNOSIS — F603 Borderline personality disorder: Secondary | ICD-10-CM | POA: Diagnosis not present

## 2020-12-13 DIAGNOSIS — E785 Hyperlipidemia, unspecified: Secondary | ICD-10-CM | POA: Diagnosis not present

## 2020-12-13 DIAGNOSIS — F339 Major depressive disorder, recurrent, unspecified: Secondary | ICD-10-CM | POA: Diagnosis not present

## 2020-12-13 DIAGNOSIS — E441 Mild protein-calorie malnutrition: Secondary | ICD-10-CM | POA: Diagnosis not present

## 2020-12-15 DIAGNOSIS — F603 Borderline personality disorder: Secondary | ICD-10-CM | POA: Diagnosis not present

## 2020-12-15 DIAGNOSIS — F332 Major depressive disorder, recurrent severe without psychotic features: Secondary | ICD-10-CM | POA: Diagnosis not present

## 2020-12-15 DIAGNOSIS — F13239 Sedative, hypnotic or anxiolytic dependence with withdrawal, unspecified: Secondary | ICD-10-CM | POA: Diagnosis not present

## 2020-12-15 DIAGNOSIS — F411 Generalized anxiety disorder: Secondary | ICD-10-CM | POA: Diagnosis not present

## 2020-12-20 DIAGNOSIS — F411 Generalized anxiety disorder: Secondary | ICD-10-CM | POA: Diagnosis not present

## 2020-12-20 DIAGNOSIS — F332 Major depressive disorder, recurrent severe without psychotic features: Secondary | ICD-10-CM | POA: Diagnosis not present

## 2020-12-20 DIAGNOSIS — F4310 Post-traumatic stress disorder, unspecified: Secondary | ICD-10-CM | POA: Diagnosis not present

## 2020-12-20 DIAGNOSIS — F603 Borderline personality disorder: Secondary | ICD-10-CM | POA: Diagnosis not present

## 2020-12-20 DIAGNOSIS — F340 Cyclothymic disorder: Secondary | ICD-10-CM | POA: Diagnosis not present

## 2020-12-22 DIAGNOSIS — F900 Attention-deficit hyperactivity disorder, predominantly inattentive type: Secondary | ICD-10-CM | POA: Diagnosis not present

## 2020-12-22 DIAGNOSIS — F422 Mixed obsessional thoughts and acts: Secondary | ICD-10-CM | POA: Diagnosis not present

## 2020-12-22 DIAGNOSIS — F4312 Post-traumatic stress disorder, chronic: Secondary | ICD-10-CM | POA: Diagnosis not present

## 2020-12-22 DIAGNOSIS — F340 Cyclothymic disorder: Secondary | ICD-10-CM | POA: Diagnosis not present

## 2020-12-22 DIAGNOSIS — Z658 Other specified problems related to psychosocial circumstances: Secondary | ICD-10-CM | POA: Diagnosis not present

## 2020-12-22 DIAGNOSIS — Z91410 Personal history of adult physical and sexual abuse: Secondary | ICD-10-CM | POA: Diagnosis not present

## 2020-12-22 DIAGNOSIS — R634 Abnormal weight loss: Secondary | ICD-10-CM | POA: Diagnosis not present

## 2020-12-22 DIAGNOSIS — M797 Fibromyalgia: Secondary | ICD-10-CM | POA: Diagnosis not present

## 2020-12-28 DIAGNOSIS — M545 Low back pain, unspecified: Secondary | ICD-10-CM | POA: Diagnosis not present

## 2020-12-28 DIAGNOSIS — F329 Major depressive disorder, single episode, unspecified: Secondary | ICD-10-CM | POA: Diagnosis not present

## 2020-12-30 DIAGNOSIS — Z658 Other specified problems related to psychosocial circumstances: Secondary | ICD-10-CM | POA: Diagnosis not present

## 2020-12-30 DIAGNOSIS — F900 Attention-deficit hyperactivity disorder, predominantly inattentive type: Secondary | ICD-10-CM | POA: Diagnosis not present

## 2020-12-30 DIAGNOSIS — R634 Abnormal weight loss: Secondary | ICD-10-CM | POA: Diagnosis not present

## 2020-12-30 DIAGNOSIS — F422 Mixed obsessional thoughts and acts: Secondary | ICD-10-CM | POA: Diagnosis not present

## 2020-12-30 DIAGNOSIS — Z91410 Personal history of adult physical and sexual abuse: Secondary | ICD-10-CM | POA: Diagnosis not present

## 2020-12-30 DIAGNOSIS — F340 Cyclothymic disorder: Secondary | ICD-10-CM | POA: Diagnosis not present

## 2020-12-30 DIAGNOSIS — M797 Fibromyalgia: Secondary | ICD-10-CM | POA: Diagnosis not present

## 2020-12-30 DIAGNOSIS — F4312 Post-traumatic stress disorder, chronic: Secondary | ICD-10-CM | POA: Diagnosis not present

## 2021-01-03 DIAGNOSIS — F900 Attention-deficit hyperactivity disorder, predominantly inattentive type: Secondary | ICD-10-CM | POA: Diagnosis not present

## 2021-01-03 DIAGNOSIS — F422 Mixed obsessional thoughts and acts: Secondary | ICD-10-CM | POA: Diagnosis not present

## 2021-01-03 DIAGNOSIS — F340 Cyclothymic disorder: Secondary | ICD-10-CM | POA: Diagnosis not present

## 2021-01-03 DIAGNOSIS — F4312 Post-traumatic stress disorder, chronic: Secondary | ICD-10-CM | POA: Diagnosis not present

## 2021-01-04 DIAGNOSIS — F422 Mixed obsessional thoughts and acts: Secondary | ICD-10-CM | POA: Diagnosis not present

## 2021-01-04 DIAGNOSIS — R634 Abnormal weight loss: Secondary | ICD-10-CM | POA: Diagnosis not present

## 2021-01-04 DIAGNOSIS — Z658 Other specified problems related to psychosocial circumstances: Secondary | ICD-10-CM | POA: Diagnosis not present

## 2021-01-04 DIAGNOSIS — F4312 Post-traumatic stress disorder, chronic: Secondary | ICD-10-CM | POA: Diagnosis not present

## 2021-01-04 DIAGNOSIS — Z91410 Personal history of adult physical and sexual abuse: Secondary | ICD-10-CM | POA: Diagnosis not present

## 2021-01-04 DIAGNOSIS — M797 Fibromyalgia: Secondary | ICD-10-CM | POA: Diagnosis not present

## 2021-01-04 DIAGNOSIS — F340 Cyclothymic disorder: Secondary | ICD-10-CM | POA: Diagnosis not present

## 2021-01-04 DIAGNOSIS — F900 Attention-deficit hyperactivity disorder, predominantly inattentive type: Secondary | ICD-10-CM | POA: Diagnosis not present

## 2021-01-10 ENCOUNTER — Telehealth (HOSPITAL_COMMUNITY): Payer: Self-pay | Admitting: Psychiatry

## 2021-01-12 DIAGNOSIS — F4312 Post-traumatic stress disorder, chronic: Secondary | ICD-10-CM | POA: Diagnosis not present

## 2021-01-12 DIAGNOSIS — H524 Presbyopia: Secondary | ICD-10-CM | POA: Diagnosis not present

## 2021-01-12 DIAGNOSIS — H5211 Myopia, right eye: Secondary | ICD-10-CM | POA: Diagnosis not present

## 2021-01-12 DIAGNOSIS — F900 Attention-deficit hyperactivity disorder, predominantly inattentive type: Secondary | ICD-10-CM | POA: Diagnosis not present

## 2021-01-12 DIAGNOSIS — H5203 Hypermetropia, bilateral: Secondary | ICD-10-CM | POA: Diagnosis not present

## 2021-01-12 DIAGNOSIS — F422 Mixed obsessional thoughts and acts: Secondary | ICD-10-CM | POA: Diagnosis not present

## 2021-01-12 DIAGNOSIS — F340 Cyclothymic disorder: Secondary | ICD-10-CM | POA: Diagnosis not present

## 2021-01-12 DIAGNOSIS — M797 Fibromyalgia: Secondary | ICD-10-CM | POA: Diagnosis not present

## 2021-01-12 DIAGNOSIS — S83241A Other tear of medial meniscus, current injury, right knee, initial encounter: Secondary | ICD-10-CM | POA: Diagnosis not present

## 2021-01-12 DIAGNOSIS — R634 Abnormal weight loss: Secondary | ICD-10-CM | POA: Diagnosis not present

## 2021-01-12 DIAGNOSIS — M4316 Spondylolisthesis, lumbar region: Secondary | ICD-10-CM | POA: Diagnosis not present

## 2021-01-12 DIAGNOSIS — G5603 Carpal tunnel syndrome, bilateral upper limbs: Secondary | ICD-10-CM | POA: Diagnosis not present

## 2021-01-12 DIAGNOSIS — H52209 Unspecified astigmatism, unspecified eye: Secondary | ICD-10-CM | POA: Diagnosis not present

## 2021-01-17 DIAGNOSIS — F340 Cyclothymic disorder: Secondary | ICD-10-CM | POA: Diagnosis not present

## 2021-01-17 DIAGNOSIS — Z1331 Encounter for screening for depression: Secondary | ICD-10-CM | POA: Diagnosis not present

## 2021-01-17 DIAGNOSIS — F900 Attention-deficit hyperactivity disorder, predominantly inattentive type: Secondary | ICD-10-CM | POA: Diagnosis not present

## 2021-01-17 DIAGNOSIS — F422 Mixed obsessional thoughts and acts: Secondary | ICD-10-CM | POA: Diagnosis not present

## 2021-01-17 DIAGNOSIS — Z1339 Encounter for screening examination for other mental health and behavioral disorders: Secondary | ICD-10-CM | POA: Diagnosis not present

## 2021-01-17 DIAGNOSIS — F4312 Post-traumatic stress disorder, chronic: Secondary | ICD-10-CM | POA: Diagnosis not present

## 2021-01-19 DIAGNOSIS — F900 Attention-deficit hyperactivity disorder, predominantly inattentive type: Secondary | ICD-10-CM | POA: Diagnosis not present

## 2021-01-19 DIAGNOSIS — F422 Mixed obsessional thoughts and acts: Secondary | ICD-10-CM | POA: Diagnosis not present

## 2021-01-19 DIAGNOSIS — F340 Cyclothymic disorder: Secondary | ICD-10-CM | POA: Diagnosis not present

## 2021-01-19 DIAGNOSIS — M797 Fibromyalgia: Secondary | ICD-10-CM | POA: Diagnosis not present

## 2021-01-19 DIAGNOSIS — Z658 Other specified problems related to psychosocial circumstances: Secondary | ICD-10-CM | POA: Diagnosis not present

## 2021-01-19 DIAGNOSIS — Z96649 Presence of unspecified artificial hip joint: Secondary | ICD-10-CM | POA: Diagnosis not present

## 2021-01-19 DIAGNOSIS — F4312 Post-traumatic stress disorder, chronic: Secondary | ICD-10-CM | POA: Diagnosis not present

## 2021-01-19 DIAGNOSIS — Z91410 Personal history of adult physical and sexual abuse: Secondary | ICD-10-CM | POA: Diagnosis not present

## 2021-01-19 DIAGNOSIS — R634 Abnormal weight loss: Secondary | ICD-10-CM | POA: Diagnosis not present

## 2021-01-20 ENCOUNTER — Other Ambulatory Visit: Payer: Self-pay | Admitting: Family Medicine

## 2021-01-20 DIAGNOSIS — M79602 Pain in left arm: Secondary | ICD-10-CM

## 2021-01-20 DIAGNOSIS — R202 Paresthesia of skin: Secondary | ICD-10-CM

## 2021-01-20 DIAGNOSIS — M79601 Pain in right arm: Secondary | ICD-10-CM

## 2021-01-24 ENCOUNTER — Encounter: Payer: Self-pay | Admitting: Neurology

## 2021-01-25 ENCOUNTER — Ambulatory Visit (HOSPITAL_COMMUNITY): Payer: Self-pay | Admitting: Licensed Clinical Social Worker

## 2021-01-25 DIAGNOSIS — F4312 Post-traumatic stress disorder, chronic: Secondary | ICD-10-CM | POA: Diagnosis not present

## 2021-01-25 DIAGNOSIS — F340 Cyclothymic disorder: Secondary | ICD-10-CM | POA: Diagnosis not present

## 2021-01-25 DIAGNOSIS — F422 Mixed obsessional thoughts and acts: Secondary | ICD-10-CM | POA: Diagnosis not present

## 2021-01-25 DIAGNOSIS — F900 Attention-deficit hyperactivity disorder, predominantly inattentive type: Secondary | ICD-10-CM | POA: Diagnosis not present

## 2021-01-28 DIAGNOSIS — F340 Cyclothymic disorder: Secondary | ICD-10-CM | POA: Diagnosis not present

## 2021-01-28 DIAGNOSIS — F4312 Post-traumatic stress disorder, chronic: Secondary | ICD-10-CM | POA: Diagnosis not present

## 2021-01-28 DIAGNOSIS — Z91410 Personal history of adult physical and sexual abuse: Secondary | ICD-10-CM | POA: Diagnosis not present

## 2021-01-28 DIAGNOSIS — M4316 Spondylolisthesis, lumbar region: Secondary | ICD-10-CM | POA: Diagnosis not present

## 2021-01-28 DIAGNOSIS — Z96649 Presence of unspecified artificial hip joint: Secondary | ICD-10-CM | POA: Diagnosis not present

## 2021-01-28 DIAGNOSIS — Z658 Other specified problems related to psychosocial circumstances: Secondary | ICD-10-CM | POA: Diagnosis not present

## 2021-01-28 DIAGNOSIS — F422 Mixed obsessional thoughts and acts: Secondary | ICD-10-CM | POA: Diagnosis not present

## 2021-01-28 DIAGNOSIS — R634 Abnormal weight loss: Secondary | ICD-10-CM | POA: Diagnosis not present

## 2021-01-28 DIAGNOSIS — F900 Attention-deficit hyperactivity disorder, predominantly inattentive type: Secondary | ICD-10-CM | POA: Diagnosis not present

## 2021-01-31 DIAGNOSIS — F4312 Post-traumatic stress disorder, chronic: Secondary | ICD-10-CM | POA: Diagnosis not present

## 2021-01-31 DIAGNOSIS — F422 Mixed obsessional thoughts and acts: Secondary | ICD-10-CM | POA: Diagnosis not present

## 2021-01-31 DIAGNOSIS — F900 Attention-deficit hyperactivity disorder, predominantly inattentive type: Secondary | ICD-10-CM | POA: Diagnosis not present

## 2021-02-03 DIAGNOSIS — Z91410 Personal history of adult physical and sexual abuse: Secondary | ICD-10-CM | POA: Diagnosis not present

## 2021-02-03 DIAGNOSIS — F900 Attention-deficit hyperactivity disorder, predominantly inattentive type: Secondary | ICD-10-CM | POA: Diagnosis not present

## 2021-02-03 DIAGNOSIS — F340 Cyclothymic disorder: Secondary | ICD-10-CM | POA: Diagnosis not present

## 2021-02-03 DIAGNOSIS — F4312 Post-traumatic stress disorder, chronic: Secondary | ICD-10-CM | POA: Diagnosis not present

## 2021-02-03 DIAGNOSIS — R634 Abnormal weight loss: Secondary | ICD-10-CM | POA: Diagnosis not present

## 2021-02-03 DIAGNOSIS — M797 Fibromyalgia: Secondary | ICD-10-CM | POA: Diagnosis not present

## 2021-02-03 DIAGNOSIS — F422 Mixed obsessional thoughts and acts: Secondary | ICD-10-CM | POA: Diagnosis not present

## 2021-02-03 DIAGNOSIS — Z96649 Presence of unspecified artificial hip joint: Secondary | ICD-10-CM | POA: Diagnosis not present

## 2021-02-03 DIAGNOSIS — Z658 Other specified problems related to psychosocial circumstances: Secondary | ICD-10-CM | POA: Diagnosis not present

## 2021-02-06 DIAGNOSIS — H52223 Regular astigmatism, bilateral: Secondary | ICD-10-CM | POA: Diagnosis not present

## 2021-02-06 DIAGNOSIS — H524 Presbyopia: Secondary | ICD-10-CM | POA: Diagnosis not present

## 2021-02-07 ENCOUNTER — Other Ambulatory Visit: Payer: Self-pay

## 2021-02-07 ENCOUNTER — Ambulatory Visit
Admission: RE | Admit: 2021-02-07 | Discharge: 2021-02-07 | Disposition: A | Discharge status: Home / Self Care | Payer: Medicare HMO | Source: Ambulatory Visit | Attending: Family Medicine | Admitting: Family Medicine

## 2021-02-07 DIAGNOSIS — M79602 Pain in left arm: Secondary | ICD-10-CM

## 2021-02-07 DIAGNOSIS — R202 Paresthesia of skin: Secondary | ICD-10-CM

## 2021-02-07 DIAGNOSIS — M4802 Spinal stenosis, cervical region: Secondary | ICD-10-CM | POA: Diagnosis not present

## 2021-02-07 DIAGNOSIS — M79601 Pain in right arm: Secondary | ICD-10-CM

## 2021-02-09 ENCOUNTER — Other Ambulatory Visit: Payer: Medicare HMO

## 2021-02-10 DIAGNOSIS — Z91410 Personal history of adult physical and sexual abuse: Secondary | ICD-10-CM | POA: Diagnosis not present

## 2021-02-10 DIAGNOSIS — M797 Fibromyalgia: Secondary | ICD-10-CM | POA: Diagnosis not present

## 2021-02-10 DIAGNOSIS — R634 Abnormal weight loss: Secondary | ICD-10-CM | POA: Diagnosis not present

## 2021-02-10 DIAGNOSIS — F900 Attention-deficit hyperactivity disorder, predominantly inattentive type: Secondary | ICD-10-CM | POA: Diagnosis not present

## 2021-02-10 DIAGNOSIS — Z96649 Presence of unspecified artificial hip joint: Secondary | ICD-10-CM | POA: Diagnosis not present

## 2021-02-10 DIAGNOSIS — F422 Mixed obsessional thoughts and acts: Secondary | ICD-10-CM | POA: Diagnosis not present

## 2021-02-10 DIAGNOSIS — F4312 Post-traumatic stress disorder, chronic: Secondary | ICD-10-CM | POA: Diagnosis not present

## 2021-02-10 DIAGNOSIS — Z658 Other specified problems related to psychosocial circumstances: Secondary | ICD-10-CM | POA: Diagnosis not present

## 2021-02-10 DIAGNOSIS — F340 Cyclothymic disorder: Secondary | ICD-10-CM | POA: Diagnosis not present

## 2021-02-14 DIAGNOSIS — F900 Attention-deficit hyperactivity disorder, predominantly inattentive type: Secondary | ICD-10-CM | POA: Diagnosis not present

## 2021-02-14 DIAGNOSIS — F422 Mixed obsessional thoughts and acts: Secondary | ICD-10-CM | POA: Diagnosis not present

## 2021-02-14 DIAGNOSIS — F340 Cyclothymic disorder: Secondary | ICD-10-CM | POA: Diagnosis not present

## 2021-02-14 DIAGNOSIS — F4312 Post-traumatic stress disorder, chronic: Secondary | ICD-10-CM | POA: Diagnosis not present

## 2021-02-15 DIAGNOSIS — M5412 Radiculopathy, cervical region: Secondary | ICD-10-CM | POA: Diagnosis not present

## 2021-02-18 DIAGNOSIS — F4312 Post-traumatic stress disorder, chronic: Secondary | ICD-10-CM | POA: Diagnosis not present

## 2021-02-18 DIAGNOSIS — Z96649 Presence of unspecified artificial hip joint: Secondary | ICD-10-CM | POA: Diagnosis not present

## 2021-02-18 DIAGNOSIS — F422 Mixed obsessional thoughts and acts: Secondary | ICD-10-CM | POA: Diagnosis not present

## 2021-02-18 DIAGNOSIS — M797 Fibromyalgia: Secondary | ICD-10-CM | POA: Diagnosis not present

## 2021-02-18 DIAGNOSIS — F340 Cyclothymic disorder: Secondary | ICD-10-CM | POA: Diagnosis not present

## 2021-02-18 DIAGNOSIS — Z658 Other specified problems related to psychosocial circumstances: Secondary | ICD-10-CM | POA: Diagnosis not present

## 2021-02-18 DIAGNOSIS — R634 Abnormal weight loss: Secondary | ICD-10-CM | POA: Diagnosis not present

## 2021-02-18 DIAGNOSIS — Z91410 Personal history of adult physical and sexual abuse: Secondary | ICD-10-CM | POA: Diagnosis not present

## 2021-02-18 DIAGNOSIS — F900 Attention-deficit hyperactivity disorder, predominantly inattentive type: Secondary | ICD-10-CM | POA: Diagnosis not present

## 2021-02-25 DIAGNOSIS — M797 Fibromyalgia: Secondary | ICD-10-CM | POA: Diagnosis not present

## 2021-02-25 DIAGNOSIS — Z658 Other specified problems related to psychosocial circumstances: Secondary | ICD-10-CM | POA: Diagnosis not present

## 2021-02-25 DIAGNOSIS — Z91410 Personal history of adult physical and sexual abuse: Secondary | ICD-10-CM | POA: Diagnosis not present

## 2021-02-25 DIAGNOSIS — M4316 Spondylolisthesis, lumbar region: Secondary | ICD-10-CM | POA: Diagnosis not present

## 2021-02-25 DIAGNOSIS — F4312 Post-traumatic stress disorder, chronic: Secondary | ICD-10-CM | POA: Diagnosis not present

## 2021-02-25 DIAGNOSIS — F900 Attention-deficit hyperactivity disorder, predominantly inattentive type: Secondary | ICD-10-CM | POA: Diagnosis not present

## 2021-02-25 DIAGNOSIS — F422 Mixed obsessional thoughts and acts: Secondary | ICD-10-CM | POA: Diagnosis not present

## 2021-02-25 DIAGNOSIS — F340 Cyclothymic disorder: Secondary | ICD-10-CM | POA: Diagnosis not present

## 2021-02-25 DIAGNOSIS — Z96649 Presence of unspecified artificial hip joint: Secondary | ICD-10-CM | POA: Diagnosis not present

## 2021-02-28 DIAGNOSIS — F4312 Post-traumatic stress disorder, chronic: Secondary | ICD-10-CM | POA: Diagnosis not present

## 2021-02-28 DIAGNOSIS — F340 Cyclothymic disorder: Secondary | ICD-10-CM | POA: Diagnosis not present

## 2021-02-28 DIAGNOSIS — M5412 Radiculopathy, cervical region: Secondary | ICD-10-CM | POA: Diagnosis not present

## 2021-02-28 DIAGNOSIS — F422 Mixed obsessional thoughts and acts: Secondary | ICD-10-CM | POA: Diagnosis not present

## 2021-02-28 DIAGNOSIS — F900 Attention-deficit hyperactivity disorder, predominantly inattentive type: Secondary | ICD-10-CM | POA: Diagnosis not present

## 2021-03-04 DIAGNOSIS — Z658 Other specified problems related to psychosocial circumstances: Secondary | ICD-10-CM | POA: Diagnosis not present

## 2021-03-04 DIAGNOSIS — Z91410 Personal history of adult physical and sexual abuse: Secondary | ICD-10-CM | POA: Diagnosis not present

## 2021-03-04 DIAGNOSIS — F900 Attention-deficit hyperactivity disorder, predominantly inattentive type: Secondary | ICD-10-CM | POA: Diagnosis not present

## 2021-03-04 DIAGNOSIS — M4316 Spondylolisthesis, lumbar region: Secondary | ICD-10-CM | POA: Diagnosis not present

## 2021-03-04 DIAGNOSIS — Z96649 Presence of unspecified artificial hip joint: Secondary | ICD-10-CM | POA: Diagnosis not present

## 2021-03-04 DIAGNOSIS — F422 Mixed obsessional thoughts and acts: Secondary | ICD-10-CM | POA: Diagnosis not present

## 2021-03-04 DIAGNOSIS — F4312 Post-traumatic stress disorder, chronic: Secondary | ICD-10-CM | POA: Diagnosis not present

## 2021-03-04 DIAGNOSIS — M797 Fibromyalgia: Secondary | ICD-10-CM | POA: Diagnosis not present

## 2021-03-04 DIAGNOSIS — F340 Cyclothymic disorder: Secondary | ICD-10-CM | POA: Diagnosis not present

## 2021-03-10 DIAGNOSIS — M797 Fibromyalgia: Secondary | ICD-10-CM | POA: Diagnosis not present

## 2021-03-10 DIAGNOSIS — F900 Attention-deficit hyperactivity disorder, predominantly inattentive type: Secondary | ICD-10-CM | POA: Diagnosis not present

## 2021-03-10 DIAGNOSIS — Z96649 Presence of unspecified artificial hip joint: Secondary | ICD-10-CM | POA: Diagnosis not present

## 2021-03-10 DIAGNOSIS — Z658 Other specified problems related to psychosocial circumstances: Secondary | ICD-10-CM | POA: Diagnosis not present

## 2021-03-10 DIAGNOSIS — F340 Cyclothymic disorder: Secondary | ICD-10-CM | POA: Diagnosis not present

## 2021-03-10 DIAGNOSIS — M4316 Spondylolisthesis, lumbar region: Secondary | ICD-10-CM | POA: Diagnosis not present

## 2021-03-10 DIAGNOSIS — Z91410 Personal history of adult physical and sexual abuse: Secondary | ICD-10-CM | POA: Diagnosis not present

## 2021-03-10 DIAGNOSIS — F4312 Post-traumatic stress disorder, chronic: Secondary | ICD-10-CM | POA: Diagnosis not present

## 2021-03-10 DIAGNOSIS — F422 Mixed obsessional thoughts and acts: Secondary | ICD-10-CM | POA: Diagnosis not present

## 2021-03-14 DIAGNOSIS — F340 Cyclothymic disorder: Secondary | ICD-10-CM | POA: Diagnosis not present

## 2021-03-14 DIAGNOSIS — F422 Mixed obsessional thoughts and acts: Secondary | ICD-10-CM | POA: Diagnosis not present

## 2021-03-14 DIAGNOSIS — F900 Attention-deficit hyperactivity disorder, predominantly inattentive type: Secondary | ICD-10-CM | POA: Diagnosis not present

## 2021-03-14 DIAGNOSIS — F4312 Post-traumatic stress disorder, chronic: Secondary | ICD-10-CM | POA: Diagnosis not present

## 2021-03-19 DIAGNOSIS — D510 Vitamin B12 deficiency anemia due to intrinsic factor deficiency: Secondary | ICD-10-CM | POA: Diagnosis not present

## 2021-03-19 DIAGNOSIS — Z91410 Personal history of adult physical and sexual abuse: Secondary | ICD-10-CM | POA: Diagnosis not present

## 2021-03-19 DIAGNOSIS — Z658 Other specified problems related to psychosocial circumstances: Secondary | ICD-10-CM | POA: Diagnosis not present

## 2021-03-19 DIAGNOSIS — F900 Attention-deficit hyperactivity disorder, predominantly inattentive type: Secondary | ICD-10-CM | POA: Diagnosis not present

## 2021-03-19 DIAGNOSIS — F422 Mixed obsessional thoughts and acts: Secondary | ICD-10-CM | POA: Diagnosis not present

## 2021-03-19 DIAGNOSIS — M4316 Spondylolisthesis, lumbar region: Secondary | ICD-10-CM | POA: Diagnosis not present

## 2021-03-19 DIAGNOSIS — F4312 Post-traumatic stress disorder, chronic: Secondary | ICD-10-CM | POA: Diagnosis not present

## 2021-03-19 DIAGNOSIS — F340 Cyclothymic disorder: Secondary | ICD-10-CM | POA: Diagnosis not present

## 2021-03-19 DIAGNOSIS — Z96649 Presence of unspecified artificial hip joint: Secondary | ICD-10-CM | POA: Diagnosis not present

## 2021-03-20 DIAGNOSIS — M5412 Radiculopathy, cervical region: Secondary | ICD-10-CM | POA: Diagnosis not present

## 2021-03-23 DIAGNOSIS — M5136 Other intervertebral disc degeneration, lumbar region: Secondary | ICD-10-CM | POA: Diagnosis not present

## 2021-03-23 DIAGNOSIS — E538 Deficiency of other specified B group vitamins: Secondary | ICD-10-CM | POA: Diagnosis not present

## 2021-03-23 DIAGNOSIS — E785 Hyperlipidemia, unspecified: Secondary | ICD-10-CM | POA: Diagnosis not present

## 2021-03-23 DIAGNOSIS — F603 Borderline personality disorder: Secondary | ICD-10-CM | POA: Diagnosis not present

## 2021-03-23 DIAGNOSIS — N183 Chronic kidney disease, stage 3 unspecified: Secondary | ICD-10-CM | POA: Diagnosis not present

## 2021-03-23 DIAGNOSIS — M4802 Spinal stenosis, cervical region: Secondary | ICD-10-CM | POA: Diagnosis not present

## 2021-03-23 DIAGNOSIS — G43909 Migraine, unspecified, not intractable, without status migrainosus: Secondary | ICD-10-CM | POA: Diagnosis not present

## 2021-03-23 DIAGNOSIS — D7589 Other specified diseases of blood and blood-forming organs: Secondary | ICD-10-CM | POA: Diagnosis not present

## 2021-03-23 DIAGNOSIS — F339 Major depressive disorder, recurrent, unspecified: Secondary | ICD-10-CM | POA: Diagnosis not present

## 2021-03-23 DIAGNOSIS — Z1589 Genetic susceptibility to other disease: Secondary | ICD-10-CM | POA: Diagnosis not present

## 2021-03-24 DIAGNOSIS — Z91410 Personal history of adult physical and sexual abuse: Secondary | ICD-10-CM | POA: Diagnosis not present

## 2021-03-24 DIAGNOSIS — M4316 Spondylolisthesis, lumbar region: Secondary | ICD-10-CM | POA: Diagnosis not present

## 2021-03-24 DIAGNOSIS — F4312 Post-traumatic stress disorder, chronic: Secondary | ICD-10-CM | POA: Diagnosis not present

## 2021-03-24 DIAGNOSIS — F340 Cyclothymic disorder: Secondary | ICD-10-CM | POA: Diagnosis not present

## 2021-03-24 DIAGNOSIS — F422 Mixed obsessional thoughts and acts: Secondary | ICD-10-CM | POA: Diagnosis not present

## 2021-03-24 DIAGNOSIS — Z96649 Presence of unspecified artificial hip joint: Secondary | ICD-10-CM | POA: Diagnosis not present

## 2021-03-24 DIAGNOSIS — D510 Vitamin B12 deficiency anemia due to intrinsic factor deficiency: Secondary | ICD-10-CM | POA: Diagnosis not present

## 2021-03-24 DIAGNOSIS — F900 Attention-deficit hyperactivity disorder, predominantly inattentive type: Secondary | ICD-10-CM | POA: Diagnosis not present

## 2021-03-24 DIAGNOSIS — Z658 Other specified problems related to psychosocial circumstances: Secondary | ICD-10-CM | POA: Diagnosis not present

## 2021-03-29 ENCOUNTER — Other Ambulatory Visit: Payer: Medicare HMO | Admitting: Family Medicine

## 2021-03-29 DIAGNOSIS — M5136 Other intervertebral disc degeneration, lumbar region: Secondary | ICD-10-CM

## 2021-04-06 DIAGNOSIS — M1711 Unilateral primary osteoarthritis, right knee: Secondary | ICD-10-CM | POA: Diagnosis not present

## 2021-04-06 DIAGNOSIS — M25561 Pain in right knee: Secondary | ICD-10-CM | POA: Diagnosis not present

## 2021-04-07 DIAGNOSIS — F340 Cyclothymic disorder: Secondary | ICD-10-CM | POA: Diagnosis not present

## 2021-04-07 DIAGNOSIS — F422 Mixed obsessional thoughts and acts: Secondary | ICD-10-CM | POA: Diagnosis not present

## 2021-04-07 DIAGNOSIS — Q057 Lumbar spina bifida without hydrocephalus: Secondary | ICD-10-CM | POA: Diagnosis not present

## 2021-04-07 DIAGNOSIS — F4312 Post-traumatic stress disorder, chronic: Secondary | ICD-10-CM | POA: Diagnosis not present

## 2021-04-07 DIAGNOSIS — F900 Attention-deficit hyperactivity disorder, predominantly inattentive type: Secondary | ICD-10-CM | POA: Diagnosis not present

## 2021-04-10 DIAGNOSIS — Z03818 Encounter for observation for suspected exposure to other biological agents ruled out: Secondary | ICD-10-CM | POA: Diagnosis not present

## 2021-04-10 DIAGNOSIS — R519 Headache, unspecified: Secondary | ICD-10-CM | POA: Diagnosis not present

## 2021-04-10 DIAGNOSIS — J0100 Acute maxillary sinusitis, unspecified: Secondary | ICD-10-CM | POA: Diagnosis not present

## 2021-04-11 DIAGNOSIS — F4312 Post-traumatic stress disorder, chronic: Secondary | ICD-10-CM | POA: Diagnosis not present

## 2021-04-11 DIAGNOSIS — F900 Attention-deficit hyperactivity disorder, predominantly inattentive type: Secondary | ICD-10-CM | POA: Diagnosis not present

## 2021-04-11 DIAGNOSIS — F340 Cyclothymic disorder: Secondary | ICD-10-CM | POA: Diagnosis not present

## 2021-04-13 DIAGNOSIS — J069 Acute upper respiratory infection, unspecified: Secondary | ICD-10-CM | POA: Diagnosis not present

## 2021-04-13 DIAGNOSIS — M5030 Other cervical disc degeneration, unspecified cervical region: Secondary | ICD-10-CM | POA: Diagnosis not present

## 2021-04-13 DIAGNOSIS — F603 Borderline personality disorder: Secondary | ICD-10-CM | POA: Diagnosis not present

## 2021-04-14 ENCOUNTER — Ambulatory Visit: Payer: Medicare HMO | Admitting: Neurology

## 2021-04-14 DIAGNOSIS — F4312 Post-traumatic stress disorder, chronic: Secondary | ICD-10-CM | POA: Diagnosis not present

## 2021-04-14 DIAGNOSIS — Q057 Lumbar spina bifida without hydrocephalus: Secondary | ICD-10-CM | POA: Diagnosis not present

## 2021-04-14 DIAGNOSIS — F900 Attention-deficit hyperactivity disorder, predominantly inattentive type: Secondary | ICD-10-CM | POA: Diagnosis not present

## 2021-04-14 DIAGNOSIS — F422 Mixed obsessional thoughts and acts: Secondary | ICD-10-CM | POA: Diagnosis not present

## 2021-04-14 DIAGNOSIS — F340 Cyclothymic disorder: Secondary | ICD-10-CM | POA: Diagnosis not present

## 2021-04-14 DIAGNOSIS — D510 Vitamin B12 deficiency anemia due to intrinsic factor deficiency: Secondary | ICD-10-CM | POA: Diagnosis not present

## 2021-04-18 DIAGNOSIS — Q057 Lumbar spina bifida without hydrocephalus: Secondary | ICD-10-CM | POA: Diagnosis not present

## 2021-04-18 DIAGNOSIS — F900 Attention-deficit hyperactivity disorder, predominantly inattentive type: Secondary | ICD-10-CM | POA: Diagnosis not present

## 2021-04-18 DIAGNOSIS — F4310 Post-traumatic stress disorder, unspecified: Secondary | ICD-10-CM | POA: Diagnosis not present

## 2021-04-18 DIAGNOSIS — D510 Vitamin B12 deficiency anemia due to intrinsic factor deficiency: Secondary | ICD-10-CM | POA: Diagnosis not present

## 2021-04-18 DIAGNOSIS — F422 Mixed obsessional thoughts and acts: Secondary | ICD-10-CM | POA: Diagnosis not present

## 2021-04-18 DIAGNOSIS — F340 Cyclothymic disorder: Secondary | ICD-10-CM | POA: Diagnosis not present

## 2021-04-19 ENCOUNTER — Ambulatory Visit
Admission: RE | Admit: 2021-04-19 | Discharge: 2021-04-19 | Disposition: A | Discharge status: Home / Self Care | Payer: Medicare HMO | Source: Ambulatory Visit | Attending: Family Medicine | Admitting: Family Medicine

## 2021-04-19 DIAGNOSIS — M48061 Spinal stenosis, lumbar region without neurogenic claudication: Secondary | ICD-10-CM | POA: Diagnosis not present

## 2021-04-19 DIAGNOSIS — M5136 Other intervertebral disc degeneration, lumbar region: Secondary | ICD-10-CM

## 2021-04-25 ENCOUNTER — Other Ambulatory Visit: Payer: Self-pay | Admitting: Family Medicine

## 2021-04-25 DIAGNOSIS — Z1231 Encounter for screening mammogram for malignant neoplasm of breast: Secondary | ICD-10-CM

## 2021-04-27 ENCOUNTER — Ambulatory Visit (HOSPITAL_COMMUNITY)
Admission: EM | Admit: 2021-04-27 | Discharge: 2021-04-27 | Disposition: A | Discharge status: Home / Self Care | Payer: Medicare HMO | Attending: Nurse Practitioner | Admitting: Nurse Practitioner

## 2021-04-27 DIAGNOSIS — F419 Anxiety disorder, unspecified: Secondary | ICD-10-CM | POA: Insufficient documentation

## 2021-04-27 DIAGNOSIS — G8929 Other chronic pain: Secondary | ICD-10-CM | POA: Insufficient documentation

## 2021-04-27 DIAGNOSIS — F1290 Cannabis use, unspecified, uncomplicated: Secondary | ICD-10-CM | POA: Insufficient documentation

## 2021-04-27 DIAGNOSIS — M25559 Pain in unspecified hip: Secondary | ICD-10-CM | POA: Insufficient documentation

## 2021-04-27 DIAGNOSIS — G43909 Migraine, unspecified, not intractable, without status migrainosus: Secondary | ICD-10-CM | POA: Diagnosis not present

## 2021-04-27 DIAGNOSIS — Z79899 Other long term (current) drug therapy: Secondary | ICD-10-CM | POA: Insufficient documentation

## 2021-04-27 DIAGNOSIS — M549 Dorsalgia, unspecified: Secondary | ICD-10-CM | POA: Insufficient documentation

## 2021-04-27 DIAGNOSIS — Z91410 Personal history of adult physical and sexual abuse: Secondary | ICD-10-CM | POA: Insufficient documentation

## 2021-04-27 NOTE — ED Notes (Addendum)
Upon triage, pt demanded that security officers stay out of her presence. Pt was firm in her request and would not interact with security. Writer guided patient through belongings process and Clinical biochemist screening with security supervision and prompting.

## 2021-04-27 NOTE — ED Notes (Signed)
PT became agitated with staff yelling loudly in hallway requesting to leave. Writer spoke with pt in attempts to de-escalate situation. Pt remained in close proximity to writer pointing finger in writers face stating "you said you could help me!". Writer reassured patient that staff do everything possible to best help each pt and that pt needed to back up. Writer told patient they would speak with nurse about discharge plans. Pt then grabbed the door to the C/A unit attempting to enter unit. Pt escalated, Probation officer used panic system for assistance. Staff arrived to help diffuse the situation without incident.

## 2021-04-27 NOTE — ED Notes (Signed)
[facility] locker  

## 2021-04-27 NOTE — ED Notes (Signed)
Pt was in hallway yelling and screaming she wanted to go home. Pt said she hate she came here she hasn't slept or eaten, and she was  Apologizing  for how she was acting. security and nursing staff was in hallway with pt for safety. [name] came and assisted with the d/c for pt. Pt was given d/c instructions and was able to go home as requested

## 2021-04-27 NOTE — Discharge Instructions (Signed)
Patient can follow up with outpatient provider
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
## 2021-04-27 NOTE — BH Assessment (Signed)
[name] is a 65 year old female presenting voluntary to [facility] requesting pain medication stating "I am in withdrawal from benzos, I am not drug seeking". Patient stated "I went through hardcore withdrawals 08/2020 - 11/2020 and haven't had anything since then". Patient denied SI, self-harming, HI and psychosis. During entire assessment patient continued share that she was having chronic pain related to migraines, back pain and hi pain, anxiety and inability to sleep. Patient was very tangential, agitated  with poor focus and pressured speech during the assessment. Patient is not receiving any outpatient mental health services. Patient is routine.

## 2021-05-05 ENCOUNTER — Other Ambulatory Visit: Payer: Self-pay

## 2021-05-05 NOTE — Patient Outreach (Signed)
[name] [facility]) Care Management  05/05/2021  [name] [facility] Dec 03, 1956 [id number]   Referral Date:05/04/21 Referral Source: Nurseline Referral Reason: Dementia   Outreach Attempt:No answer.  HIPAA compliant voice message left.   Plan: RN CM will attempt patient again within 4 business days and send letter   Jone Baseman, RN, MSN [facility] Management Care Management Coordinator Direct Line [phone] Toll Free: [phone]  Fax: [phone]

## 2021-05-08 ENCOUNTER — Ambulatory Visit: Payer: Self-pay

## 2021-05-08 DIAGNOSIS — R030 Elevated blood-pressure reading, without diagnosis of hypertension: Secondary | ICD-10-CM | POA: Diagnosis not present

## 2021-05-08 DIAGNOSIS — M5412 Radiculopathy, cervical region: Secondary | ICD-10-CM | POA: Diagnosis not present

## 2021-05-09 ENCOUNTER — Other Ambulatory Visit: Payer: Self-pay

## 2021-05-09 NOTE — Patient Outreach (Signed)
[place] [facility]) Care Management  05/09/2021  Damya Comley [facility] August 01, 1956 [id number]   Referral Date:05/04/21 Referral Source: Nurseline Referral Reason: Dementia  Telephone call to patient. She reports that she is looking for help in the home as she needs a neck surgery.  She states she has no one to help her. She cannot depend on her sister who is local.  She has no friends.  She states she gets $1700 per month. She knows she does not qualify for medicaid.  Discussed [facility] services.  Patient over income limit for medicaid. Discussed private pay resources. She angrily stated she cannot afford that and wishes not to proceed.  Patient went on a rant about her life and then ended the call.  Plan: RN CM will close case.    Jone Baseman, RN, MSN [facility] Management Care Management Coordinator Direct Line [phone] Cell [phone] Toll Free: [phone]  Fax: [phone]

## 2021-05-11 DIAGNOSIS — F603 Borderline personality disorder: Secondary | ICD-10-CM | POA: Diagnosis not present

## 2021-05-11 DIAGNOSIS — G43909 Migraine, unspecified, not intractable, without status migrainosus: Secondary | ICD-10-CM | POA: Diagnosis not present

## 2021-05-11 DIAGNOSIS — M5030 Other cervical disc degeneration, unspecified cervical region: Secondary | ICD-10-CM | POA: Diagnosis not present

## 2021-05-11 DIAGNOSIS — M5136 Other intervertebral disc degeneration, lumbar region: Secondary | ICD-10-CM | POA: Diagnosis not present

## 2021-05-12 DIAGNOSIS — F900 Attention-deficit hyperactivity disorder, predominantly inattentive type: Secondary | ICD-10-CM | POA: Diagnosis not present

## 2021-05-12 DIAGNOSIS — Q057 Lumbar spina bifida without hydrocephalus: Secondary | ICD-10-CM | POA: Diagnosis not present

## 2021-05-12 DIAGNOSIS — F422 Mixed obsessional thoughts and acts: Secondary | ICD-10-CM | POA: Diagnosis not present

## 2021-05-12 DIAGNOSIS — F340 Cyclothymic disorder: Secondary | ICD-10-CM | POA: Diagnosis not present

## 2021-05-12 DIAGNOSIS — D510 Vitamin B12 deficiency anemia due to intrinsic factor deficiency: Secondary | ICD-10-CM | POA: Diagnosis not present

## 2021-05-12 DIAGNOSIS — F4312 Post-traumatic stress disorder, chronic: Secondary | ICD-10-CM | POA: Diagnosis not present

## 2021-05-13 DIAGNOSIS — H04121 Dry eye syndrome of right lacrimal gland: Secondary | ICD-10-CM | POA: Diagnosis not present

## 2021-05-18 DIAGNOSIS — N9489 Other specified conditions associated with female genital organs and menstrual cycle: Secondary | ICD-10-CM | POA: Diagnosis not present

## 2021-05-18 DIAGNOSIS — R102 Pelvic and perineal pain: Secondary | ICD-10-CM | POA: Diagnosis not present

## 2021-05-19 DIAGNOSIS — F422 Mixed obsessional thoughts and acts: Secondary | ICD-10-CM | POA: Diagnosis not present

## 2021-05-19 DIAGNOSIS — F340 Cyclothymic disorder: Secondary | ICD-10-CM | POA: Diagnosis not present

## 2021-05-19 DIAGNOSIS — Q057 Lumbar spina bifida without hydrocephalus: Secondary | ICD-10-CM | POA: Diagnosis not present

## 2021-05-19 DIAGNOSIS — D510 Vitamin B12 deficiency anemia due to intrinsic factor deficiency: Secondary | ICD-10-CM | POA: Diagnosis not present

## 2021-05-19 DIAGNOSIS — F4312 Post-traumatic stress disorder, chronic: Secondary | ICD-10-CM | POA: Diagnosis not present

## 2021-05-19 DIAGNOSIS — F900 Attention-deficit hyperactivity disorder, predominantly inattentive type: Secondary | ICD-10-CM | POA: Diagnosis not present

## 2021-05-25 ENCOUNTER — Ambulatory Visit (HOSPITAL_COMMUNITY)
Admission: EM | Admit: 2021-05-25 | Discharge: 2021-05-25 | Disposition: A | Discharge status: Home / Self Care | Payer: Medicare HMO | Attending: Clinical | Admitting: Clinical

## 2021-05-25 ENCOUNTER — Other Ambulatory Visit: Payer: Self-pay

## 2021-05-25 DIAGNOSIS — F4310 Post-traumatic stress disorder, unspecified: Secondary | ICD-10-CM | POA: Diagnosis not present

## 2021-05-25 DIAGNOSIS — F411 Generalized anxiety disorder: Secondary | ICD-10-CM | POA: Diagnosis not present

## 2021-05-25 DIAGNOSIS — R519 Headache, unspecified: Secondary | ICD-10-CM | POA: Insufficient documentation

## 2021-05-25 DIAGNOSIS — G8929 Other chronic pain: Secondary | ICD-10-CM | POA: Diagnosis not present

## 2021-05-25 DIAGNOSIS — R454 Irritability and anger: Secondary | ICD-10-CM | POA: Diagnosis not present

## 2021-05-25 DIAGNOSIS — Z79899 Other long term (current) drug therapy: Secondary | ICD-10-CM | POA: Diagnosis not present

## 2021-05-25 DIAGNOSIS — Z91410 Personal history of adult physical and sexual abuse: Secondary | ICD-10-CM | POA: Diagnosis not present

## 2021-05-25 DIAGNOSIS — Z9151 Personal history of suicidal behavior: Secondary | ICD-10-CM | POA: Diagnosis not present
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
## 2021-05-25 NOTE — Discharge Instructions (Signed)

## 2021-05-25 NOTE — ED Provider Notes (Signed)
Behavioral Health Urgent Care Medical Screening Exam  Patient Name: [name] MRN: [id number] Date of Evaluation: 05/25/21 Chief Complaint:   Diagnosis:  Final diagnoses:  None    History of Present illness: Patient presents voluntarily to [facility] behavioral health for walk-in assessment.   [name] is a 65 y.o. female.  Patient reports she would like to be prescribed alternative medications today.  She states "I have go to have something to calm me down. I need something to help calm me down. I want the klonopin back."  She reports she has been prescribed clonazepam in the past, last dose of klonopin in December 2021.   [name] reports history of PTSD, she reports she was followed by Dr. Chucky May prior to December 2021.  She reports she is not permitted to return to Dr. Starleen Arms care, per Dr Toy Care. She is currently followed by outpatient psychiatry [facility].  She is seen by nurse practitioner Alvester Chou who prescribes gabapentin.  She believes her gabapentin dose may be too low.  She is compliant with her gabapentin.  She is also prescribed Topamax for headaches and Protonix, reports she is compliant with these medications as well.  She has an appointment to see outpatient psychiatry on tomorrow.  She reports recent stressors include a conversation with preoperative staff at a local surgery center related to an upcoming spinal fusion.  [name] became frustrated and "blew a head gasket" she reports that she cursed and yelled at staff.  She is concerned that she may not be allowed to have surgery as scheduled related to this episode.  She also endorses chronic pain and feelings of "stiffness."  She reports she has been scheduled for evaluation in a pain clinic,  appointment at [facility] scheduled for tomorrow.  She reports she believes stiffness stems from benzodiazepine withdrawal.  [name] is alert and oriented, pleasant and  cooperative during assessment.  She reports feeling that her mood is anxious.  She denies suicidal and homicidal ideations.  She denies self-harm behaviors.  She endorses 3 prior suicide attempts, last attempt several years ago.  She states "I did not want to kill myself, I did it for attention."  She denies auditory visual hallucinations.  There is no evidence of delusional thought content and she denies symptoms of paranoia.  She contracts verbally for safety with this Probation officer.  She resides alone in [place].  She denies access to weapons.  She is retired.  She endorses decreased sleep and average appetite.  She denies alcohol use.  She endorses marijuana use, reports last use of marijuana was several weeks ago in preparation for visiting the pain clinic tomorrow.  Patient offered support and encouragement.  She denies any person to contact for collateral information.  Psychiatric Specialty Exam  Presentation  General Appearance:Appropriate for Environment; Casual  Eye Contact:Good  Speech:Clear and Coherent; Normal Rate  Speech Volume:Normal  Handedness:Right   Mood and Affect  Mood:Anxious  Affect:Congruent   Thought Process  Thought Processes:Coherent; Goal Directed  Descriptions of Associations:Intact  Orientation:Full (Time, Place and Person)  Thought Content:Logical    Hallucinations:None  Ideas of Reference:None  Suicidal Thoughts:No  Homicidal Thoughts:No   Sensorium  Memory:Immediate Good; Recent Good; Remote Good  Judgment:Fair  Insight:Fair   Executive Functions  Concentration:Good  Attention Span:Good  [place]  Language:Good   Psychomotor Activity  Psychomotor Activity:Normal   Assets  Assets:Communication Skills; Desire for Improvement; Financial Resources/Insurance; Housing; Intimacy; Leisure Time;  Physical Health; Resilience; Social Support; Transportation   Sleep  Sleep:Poor  Number of hours:  3   No data recorded  Physical Exam: Physical Exam Vitals and nursing note reviewed.  Constitutional:      Appearance: Normal appearance. She is well-developed.  HENT:     Head: Normocephalic and atraumatic.     Nose: Nose normal.  Cardiovascular:     Rate and Rhythm: Normal rate.  Pulmonary:     Effort: Pulmonary effort is normal.  Musculoskeletal:        General: Normal range of motion.  Neurological:     Mental Status: She is alert and oriented to person, place, and time.  Psychiatric:        Attention and Perception: Attention and perception normal.        Mood and Affect: Affect normal. Mood is anxious.        Speech: Speech is rapid and pressured.        Behavior: Behavior is cooperative.        Thought Content: Thought content normal.        Cognition and Memory: Cognition normal.        Judgment: Judgment normal.    Review of Systems  Constitutional: Negative.   HENT: Negative.   Eyes: Negative.   Respiratory: Negative.   Cardiovascular: Negative.   Gastrointestinal: Negative.   Genitourinary: Negative.   Musculoskeletal: Negative.   Skin: Negative.   Neurological: Negative.   Endo/Heme/Allergies: Negative.   Psychiatric/Behavioral: The patient is nervous/anxious and has insomnia.    There were no vitals taken for this visit. There is no height or weight on file to calculate BMI.  Musculoskeletal: Strength & Muscle Tone: within normal limits Gait & Station: normal Patient leans: N/A   [place] [facility] Discharge Disposition for Follow up and Recommendations: Based on my evaluation the patient does not appear to have an emergency medical condition and can be discharged with resources and follow up care in outpatient services for Medication Management and Individual Therapy  Patient reviewed with Dr. Serafina Mitchell. Follow-up with established outpatient psychiatry. Continue current medications.   Lucky Rathke, FNP 05/25/2021, 12:26 PM

## 2021-06-16 ENCOUNTER — Ambulatory Visit: Payer: Medicare HMO

## 2021-06-20 ENCOUNTER — Other Ambulatory Visit: Payer: Self-pay | Admitting: Orthopedic Surgery

## 2021-06-21 ENCOUNTER — Other Ambulatory Visit: Payer: Self-pay | Admitting: Nurse Practitioner

## 2021-06-21 DIAGNOSIS — M8580 Other specified disorders of bone density and structure, unspecified site: Secondary | ICD-10-CM

## 2021-07-14 NOTE — Pre-Procedure Instructions (Signed)
Surgical Instructions    Your procedure is scheduled on Wednesday, July 27th, 2022.  Report to [facility] Main Entrance "A" at 05:30 A.M., then check in with the Admitting office.  Call this number if you have problems the morning of surgery:  [phone]   If you have any questions prior to your surgery date call [phone]: Open Monday-Friday 8am-4pm    Remember:  Do not eat after midnight the night before your surgery  You may drink clear liquids until 04:30 A.M. the morning of your surgery.   Clear liquids allowed are: Water, Non-Citrus Juices (without pulp), Carbonated Beverages, Clear Tea, Black Coffee Only, and Gatorade   Enhanced Recovery after Surgery for Orthopedics Enhanced Recovery after Surgery is a protocol used to improve the stress on your body and your recovery after surgery.  Patient Instructions  The day of surgery (if you do NOT have diabetes):  Drink ONE (1) Pre-Surgery Clear Ensure by _____ am the morning of surgery   This drink was given to you during your hospital  pre-op appointment visit. Nothing else to drink after completing the  Pre-Surgery Clear Ensure.         If you have questions, please contact your surgeon's office.     Take these medicines the morning of surgery with A SIP OF WATER: gabapentin (NEURONTIN pantoprazole (PROTONIX) tiZANidine (ZANAFLEX) topiramate (TOPAMAX)   Take these medicines AS NEEDED: acetaminophen (TYLENOL) promethazine (PHENERGAN) SUMAtriptan (IMITREX) traMADol (ULTRAM)    As of today, STOP taking any diclofenac Sodium (VOLTAREN), Aspirin (unless otherwise instructed by your surgeon) Aleve, Naproxen, Ibuprofen, Motrin, Advil, Goody's, BC's, all herbal medications, fish oil, and all vitamins.          Do not wear jewelry or makeup Do not wear lotions, powders, perfumes, or deodorant. Do not shave 48 hours prior to surgery.  Do not bring valuables to the hospital. DO Not wear nail polish, gel polish,  artificial nails, or any other type of covering on  natural nails including finger and toenails. If patients have artificial nails, gel coating, etc. that need to be removed by a nail salon please have this removed prior to surgery or surgery may need to be canceled/delayed if the surgeon/ anesthesia feels like the patient is unable to be adequately monitored.             [facility] is not responsible for any belongings or valuables.  Do NOT Smoke (Tobacco/Vaping) or drink Alcohol 24 hours prior to your procedure If you use a CPAP at night, you may bring all equipment for your overnight stay.   Contacts, glasses, dentures or bridgework may not be worn into surgery, please bring cases for these belongings   For patients admitted to the hospital, discharge time will be determined by your treatment team.   Patients discharged the day of surgery will not be allowed to drive home, and someone needs to stay with them for 24 hours.  ONLY 1 SUPPORT PERSON MAY BE PRESENT WHILE YOU ARE IN SURGERY. IF YOU ARE TO BE ADMITTED ONCE YOU ARE IN YOUR ROOM YOU WILL BE ALLOWED TWO (2) VISITORS.  Minor children may have two parents present. Special consideration for safety and communication needs will be reviewed on a case by case basis.  Special instructions:    Oral Hygiene is also important to reduce your risk of infection.  Remember - BRUSH YOUR TEETH THE MORNING OF SURGERY WITH YOUR REGULAR TOOTHPASTE   [facility]- Preparing For Surgery  Before surgery, you can play an important role. Because skin is not sterile, your skin needs to be as free of germs as possible. You can reduce the number of germs on your skin by washing with CHG (chlorahexidine gluconate) Soap before surgery.  CHG is an antiseptic cleaner which kills germs and bonds with the skin to continue killing germs even after washing.     Please do not use if you have an allergy to CHG or antibacterial soaps. If your skin becomes  reddened/irritated stop using the CHG.  Do not shave (including legs and underarms) for at least 48 hours prior to first CHG shower. It is OK to shave your face.  Please follow these instructions carefully.     Shower the NIGHT BEFORE SURGERY and the MORNING OF SURGERY with CHG Soap.   If you chose to wash your hair, wash your hair first as usual with your normal shampoo. After you shampoo, rinse your hair and body thoroughly to remove the shampoo.  Then [employer] and genitals (private parts) with your normal soap and rinse thoroughly to remove soap.  After that Use CHG Soap as you would any other liquid soap. You can apply CHG directly to the skin and wash gently with a scrungie or a clean washcloth.   Apply the CHG Soap to your body ONLY FROM THE NECK DOWN.  Do not use on open wounds or open sores. Avoid contact with your eyes, ears, mouth and genitals (private parts). Wash Face and genitals (private parts)  with your normal soap.   Wash thoroughly, paying special attention to the area where your surgery will be performed.  Thoroughly rinse your body with warm water from the neck down.  DO NOT shower/wash with your normal soap after using and rinsing off the CHG Soap.  Pat yourself dry with a CLEAN TOWEL.  Wear CLEAN PAJAMAS to bed the night before surgery  Place CLEAN SHEETS on your bed the night before your surgery  DO NOT SLEEP WITH PETS.   Day of Surgery:  Take a shower with CHG soap. Wear Clean/Comfortable clothing the morning of surgery Do not apply any deodorants/lotions.   Remember to brush your teeth WITH YOUR REGULAR TOOTHPASTE.   Please read over the following fact sheets that you were given.

## 2021-07-17 ENCOUNTER — Encounter (HOSPITAL_COMMUNITY): Payer: Self-pay

## 2021-07-17 ENCOUNTER — Other Ambulatory Visit: Payer: Self-pay

## 2021-07-17 ENCOUNTER — Encounter (HOSPITAL_COMMUNITY)
Admission: RE | Admit: 2021-07-17 | Discharge: 2021-07-17 | Disposition: A | Discharge status: Home / Self Care | Payer: Medicare HMO | Source: Ambulatory Visit | Attending: Orthopedic Surgery | Admitting: Orthopedic Surgery

## 2021-07-17 DIAGNOSIS — Z20822 Contact with and (suspected) exposure to covid-19: Secondary | ICD-10-CM | POA: Insufficient documentation

## 2021-07-17 DIAGNOSIS — Z01812 Encounter for preprocedural laboratory examination: Secondary | ICD-10-CM | POA: Insufficient documentation

## 2021-07-17 LAB — CBC WITH DIFFERENTIAL/PLATELET
Abs Immature Granulocytes: 0.01 10*3/uL (ref 0.00–0.07)
Basophils Absolute: 0.1 10*3/uL (ref 0.0–0.1)
Basophils Relative: 1 %
Eosinophils Absolute: 0 10*3/uL (ref 0.0–0.5)
Eosinophils Relative: 1 %
HCT: 39.8 % (ref 36.0–46.0)
Hemoglobin: 12.8 g/dL (ref 12.0–15.0)
Immature Granulocytes: 0 %
Lymphocytes Relative: 39 %
Lymphs Abs: 1.8 10*3/uL (ref 0.7–4.0)
MCH: 33.9 pg (ref 26.0–34.0)
MCHC: 32.2 g/dL (ref 30.0–36.0)
MCV: 105.3 fL — ABNORMAL HIGH (ref 80.0–100.0)
Monocytes Absolute: 0.5 10*3/uL (ref 0.1–1.0)
Monocytes Relative: 12 %
Neutro Abs: 2.1 10*3/uL (ref 1.7–7.7)
Neutrophils Relative %: 47 %
Platelets: 140 10*3/uL — ABNORMAL LOW (ref 150–400)
RBC: 3.78 MIL/uL — ABNORMAL LOW (ref 3.87–5.11)
RDW: 12.4 % (ref 11.5–15.5)
WBC: 4.5 10*3/uL (ref 4.0–10.5)
nRBC: 0 % (ref 0.0–0.2)

## 2021-07-17 LAB — URINALYSIS, ROUTINE W REFLEX MICROSCOPIC
Bilirubin Urine: NEGATIVE
Glucose, UA: NEGATIVE mg/dL
Hgb urine dipstick: NEGATIVE
Ketones, ur: NEGATIVE mg/dL
Leukocytes,Ua: NEGATIVE
Nitrite: NEGATIVE
Protein, ur: NEGATIVE mg/dL
Specific Gravity, Urine: 1.01 (ref 1.005–1.030)
pH: 6 (ref 5.0–8.0)

## 2021-07-17 LAB — COMPREHENSIVE METABOLIC PANEL
ALT: 18 U/L (ref 0–44)
AST: 22 U/L (ref 15–41)
Albumin: 3.9 g/dL (ref 3.5–5.0)
Alkaline Phosphatase: 60 U/L (ref 38–126)
Anion gap: 6 (ref 5–15)
BUN: 19 mg/dL (ref 8–23)
CO2: 21 mmol/L — ABNORMAL LOW (ref 22–32)
Calcium: 9.3 mg/dL (ref 8.9–10.3)
Chloride: 109 mmol/L (ref 98–111)
Creatinine, Ser: 1.04 mg/dL — ABNORMAL HIGH (ref 0.44–1.00)
GFR, Estimated: 59 mL/min — ABNORMAL LOW (ref >60)
Glucose, Bld: 85 mg/dL (ref 70–99)
Potassium: 4.2 mmol/L (ref 3.5–5.1)
Sodium: 136 mmol/L (ref 135–145)
Total Bilirubin: 0.5 mg/dL (ref 0.3–1.2)
Total Protein: 6.4 g/dL — ABNORMAL LOW (ref 6.5–8.1)

## 2021-07-17 LAB — SARS CORONAVIRUS 2 (TAT 6-24 HRS): SARS Coronavirus 2: NEGATIVE

## 2021-07-17 LAB — SURGICAL PCR SCREEN
MRSA, PCR: NEGATIVE
Staphylococcus aureus: NEGATIVE

## 2021-07-17 LAB — PROTIME-INR
INR: 1 (ref 0.8–1.2)
Prothrombin Time: 13.4 seconds (ref 11.4–15.2)

## 2021-07-17 LAB — APTT: aPTT: 29 seconds (ref 24–36)

## 2021-07-17 NOTE — Pre-Procedure Instructions (Signed)
Surgical Instructions    Your procedure is scheduled on Wednesday, July 27th, 2022.  Report to [facility] Main Entrance "A" at 05:30 A.M., then check in with the Admitting office.  Call this number if you have problems the morning of surgery:  [phone]   If you have any questions prior to your surgery date call [phone]: Open Monday-Friday 8am-4pm    Remember:  Do not eat after midnight the night before your surgery  You may drink clear liquids until 04:30 A.M. the morning of your surgery.   Clear liquids allowed are: Water, Non-Citrus Juices (without pulp), Carbonated Beverages, Clear Tea, Black Coffee Only, and Gatorade   Enhanced Recovery after Surgery for Orthopedics Enhanced Recovery after Surgery is a protocol used to improve the stress on your body and your recovery after surgery.  Patient Instructions  The day of surgery (if you do NOT have diabetes):  Drink ONE (1) Pre-Surgery Clear Ensure by 04:30 am the morning of surgery   This drink was given to you during your hospital  pre-op appointment visit. Nothing else to drink after completing the  Pre-Surgery Clear Ensure.         If you have questions, please contact your surgeon's office.     Take these medicines the morning of surgery with A SIP OF WATER: gabapentin (NEURONTIN pantoprazole (PROTONIX) tiZANidine (ZANAFLEX) topiramate (TOPAMAX)   Take these medicines AS NEEDED: acetaminophen (TYLENOL) promethazine (PHENERGAN) SUMAtriptan (IMITREX) traMADol (ULTRAM)    As of today, STOP taking any diclofenac Sodium (VOLTAREN), Aspirin (unless otherwise instructed by your surgeon) Aleve, Naproxen, Ibuprofen, Motrin, Advil, Goody's, BC's, all herbal medications, fish oil, and all vitamins.          Do not wear jewelry or makeup Do not wear lotions, powders, perfumes, or deodorant. Do not shave 48 hours prior to surgery.  Do not bring valuables to the hospital. DO Not wear nail polish, gel polish,  artificial nails, or any other type of covering on  natural nails including finger and toenails. If patients have artificial nails, gel coating, etc. that need to be removed by a nail salon please have this removed prior to surgery or surgery may need to be canceled/delayed if the surgeon/ anesthesia feels like the patient is unable to be adequately monitored.             [facility] is not responsible for any belongings or valuables.  Do NOT Smoke (Tobacco/Vaping) or drink Alcohol 24 hours prior to your procedure If you use a CPAP at night, you may bring all equipment for your overnight stay.   Contacts, glasses, dentures or bridgework may not be worn into surgery, please bring cases for these belongings   For patients admitted to the hospital, discharge time will be determined by your treatment team.   Patients discharged the day of surgery will not be allowed to drive home, and someone needs to stay with them for 24 hours.  ONLY 1 SUPPORT PERSON MAY BE PRESENT WHILE YOU ARE IN SURGERY. IF YOU ARE TO BE ADMITTED ONCE YOU ARE IN YOUR ROOM YOU WILL BE ALLOWED TWO (2) VISITORS.  Minor children may have two parents present. Special consideration for safety and communication needs will be reviewed on a case by case basis.  Special instructions:    Oral Hygiene is also important to reduce your risk of infection.  Remember - BRUSH YOUR TEETH THE MORNING OF SURGERY WITH YOUR REGULAR TOOTHPASTE   [facility]- Preparing For Surgery  Before surgery, you can play an important role. Because skin is not sterile, your skin needs to be as free of germs as possible. You can reduce the number of germs on your skin by washing with CHG (chlorahexidine gluconate) Soap before surgery.  CHG is an antiseptic cleaner which kills germs and bonds with the skin to continue killing germs even after washing.     Please do not use if you have an allergy to CHG or antibacterial soaps. If your skin becomes  reddened/irritated stop using the CHG.  Do not shave (including legs and underarms) for at least 48 hours prior to first CHG shower. It is OK to shave your face.  Please follow these instructions carefully.     Shower the NIGHT BEFORE SURGERY and the MORNING OF SURGERY with CHG Soap.   If you chose to wash your hair, wash your hair first as usual with your normal shampoo. After you shampoo, rinse your hair and body thoroughly to remove the shampoo.  Then [employer] and genitals (private parts) with your normal soap and rinse thoroughly to remove soap.  After that Use CHG Soap as you would any other liquid soap. You can apply CHG directly to the skin and wash gently with a scrungie or a clean washcloth.   Apply the CHG Soap to your body ONLY FROM THE NECK DOWN.  Do not use on open wounds or open sores. Avoid contact with your eyes, ears, mouth and genitals (private parts). Wash Face and genitals (private parts)  with your normal soap.   Wash thoroughly, paying special attention to the area where your surgery will be performed.  Thoroughly rinse your body with warm water from the neck down.  DO NOT shower/wash with your normal soap after using and rinsing off the CHG Soap.  Pat yourself dry with a CLEAN TOWEL.  Wear CLEAN PAJAMAS to bed the night before surgery  Place CLEAN SHEETS on your bed the night before your surgery  DO NOT SLEEP WITH PETS.   Day of Surgery:  Take a shower with CHG soap. Wear Clean/Comfortable clothing the morning of surgery Do not apply any deodorants/lotions.   Remember to brush your teeth WITH YOUR REGULAR TOOTHPASTE.   Please read over the following fact sheets that you were given.

## 2021-07-17 NOTE — Progress Notes (Signed)
PCP - Karl Bales, MD Cardiologist - denies  PPM/ICD - denies Device Orders - N/A Rep Notified - N/A  Chest x-ray - N/A EKG - N/A Stress Test - denies ECHO - denies Cardiac Cath - denies  Sleep Study - denies CPAP - N/A  Fasting Blood Sugar - N/A  Blood Thinner Instructions: N/A   Aspirin Instructions: Patient was instructed: As of today, STOP taking any diclofenac Sodium (VOLTAREN), Aspirin (unless otherwise instructed by your surgeon) Aleve, Naproxen, Ibuprofen, Motrin, Advil, Goody's, BC's, all herbal medications, fish oil, and all vitamins  ERAS Protcol - yes PRE-SURGERY Ensure - yes  COVID TEST- 07/17/2021   Anesthesia review: Yes - history of anesthesia complications  Patient denies shortness of breath, fever, cough and chest pain at PAT appointment   All instructions explained to the patient, with a verbal understanding of the material. Patient agrees to go over the instructions while at home for a better understanding. Patient also instructed to self quarantine after being tested for COVID-19. The opportunity to ask questions was provided.

## 2021-07-18 NOTE — Anesthesia Preprocedure Evaluation (Addendum)
Anesthesia Evaluation  Patient identified by MRN, date of birth, ID band Patient awake    Reviewed: Allergy & Precautions, NPO status , Patient's Chart, lab work & pertinent test results  Airway Mallampati: II  TM Distance: >3 FB Neck ROM: Limited    Dental  (+) Teeth Intact   Pulmonary shortness of breath and with exertion, former smoker,    Pulmonary exam normal        Cardiovascular negative cardio ROS   Rhythm:Regular Rate:Normal     Neuro/Psych  Headaches, Anxiety Depression    GI/Hepatic Neg liver ROS, GERD  ,  Endo/Other  negative endocrine ROS  Renal/GU negative Renal ROS  negative genitourinary   Musculoskeletal  (+) Arthritis , Osteoarthritis,  Cervical radiculopathy   Abdominal (+)  Abdomen: soft. Bowel sounds: normal.  Peds  Hematology  (+) anemia ,   Anesthesia Other Findings   Reproductive/Obstetrics                            Anesthesia Physical Anesthesia Plan  ASA: 2  Anesthesia Plan: General   Post-op Pain Management:    Induction: Intravenous  PONV Risk Score and Plan: 3 and Ondansetron, Dexamethasone, Midazolam and Treatment may vary due to age or medical condition  Airway Management Planned: Mask and Oral ETT  Additional Equipment: None  Intra-op Plan:   Post-operative Plan: Extubation in OR  Informed Consent: I have reviewed the patients History and Physical, chart, labs and discussed the procedure including the risks, benefits and alternatives for the proposed anesthesia with the patient or authorized representative who has indicated his/her understanding and acceptance.     Dental advisory given  Plan Discussed with: CRNA  Anesthesia Plan Comments: (Lab Results      Component                Value               Date                      WBC                      4.5                 07/17/2021                HGB                      12.8                 07/17/2021                HCT                      39.8                07/17/2021                MCV                      105.3 (H)           07/17/2021                PLT                      140 (L)  07/17/2021           Lab Results      Component                Value               Date                      NA                       136                 07/17/2021                K                        4.2                 07/17/2021                CO2                      21 (L)              07/17/2021                GLUCOSE                  85                  07/17/2021                BUN                      19                  07/17/2021                CREATININE               1.04 (H)            07/17/2021                CALCIUM                  9.3                 07/17/2021                GFRNONAA                 59 (L)              07/17/2021                GFRAA                    42 (L)              04/13/2020          )       Anesthesia Quick Evaluation

## 2021-07-19 ENCOUNTER — Observation Stay (HOSPITAL_COMMUNITY)
Admission: RE | Admit: 2021-07-19 | Discharge: 2021-07-20 | Disposition: A | Discharge status: Home / Self Care | Payer: Medicare HMO | Attending: Orthopedic Surgery | Admitting: Orthopedic Surgery

## 2021-07-19 ENCOUNTER — Ambulatory Visit (HOSPITAL_COMMUNITY): Payer: Medicare HMO | Admitting: Anesthesiology

## 2021-07-19 ENCOUNTER — Ambulatory Visit (HOSPITAL_COMMUNITY): Payer: Medicare HMO | Admitting: Physician Assistant

## 2021-07-19 ENCOUNTER — Ambulatory Visit (HOSPITAL_COMMUNITY): Payer: Medicare HMO

## 2021-07-19 ENCOUNTER — Encounter (HOSPITAL_COMMUNITY)
Admission: RE | Disposition: A | Discharge status: Home / Self Care | Payer: Self-pay | Source: Home / Self Care | Attending: Orthopedic Surgery

## 2021-07-19 ENCOUNTER — Other Ambulatory Visit: Payer: Self-pay

## 2021-07-19 ENCOUNTER — Encounter (HOSPITAL_COMMUNITY): Payer: Self-pay | Admitting: Orthopedic Surgery

## 2021-07-19 DIAGNOSIS — Z87891 Personal history of nicotine dependence: Secondary | ICD-10-CM | POA: Insufficient documentation

## 2021-07-19 DIAGNOSIS — Z96641 Presence of right artificial hip joint: Secondary | ICD-10-CM | POA: Diagnosis not present

## 2021-07-19 DIAGNOSIS — M4802 Spinal stenosis, cervical region: Secondary | ICD-10-CM | POA: Insufficient documentation

## 2021-07-19 DIAGNOSIS — M5412 Radiculopathy, cervical region: Principal | ICD-10-CM | POA: Insufficient documentation

## 2021-07-19 DIAGNOSIS — Z419 Encounter for procedure for purposes other than remedying health state, unspecified: Secondary | ICD-10-CM

## 2021-07-19 DIAGNOSIS — Z79899 Other long term (current) drug therapy: Secondary | ICD-10-CM | POA: Insufficient documentation

## 2021-07-19 DIAGNOSIS — M5410 Radiculopathy, site unspecified: Secondary | ICD-10-CM | POA: Diagnosis present

## 2021-07-19 HISTORY — PX: ANTERIOR CERVICAL DECOMPRESSION/DISCECTOMY FUSION 4 LEVELS: SHX5556

## 2021-07-19 SURGERY — ANTERIOR CERVICAL DECOMPRESSION/DISCECTOMY FUSION 4 LEVELS
Anesthesia: General

## 2021-07-19 MED ORDER — SENNOSIDES-DOCUSATE SODIUM 8.6-50 MG PO TABS
1.0000 | ORAL_TABLET | Freq: Every evening | ORAL | Status: DC | PRN
Start: 2021-07-19 — End: 2021-07-20

## 2021-07-19 MED ORDER — FLEET ENEMA 7-19 GM/118ML RE ENEM: 1.0000 | ENEMA | Freq: Once | RECTAL | Status: DC | PRN

## 2021-07-19 MED ORDER — BISACODYL 5 MG PO TBEC: 5.0000 mg | DELAYED_RELEASE_TABLET | Freq: Every day | ORAL | Status: DC | PRN

## 2021-07-19 MED ORDER — ONDANSETRON HCL 4 MG/2ML IJ SOLN
INTRAMUSCULAR | Status: AC
  Filled 2021-07-19: qty 2

## 2021-07-19 MED ORDER — CEFAZOLIN SODIUM-DEXTROSE 2-4 GM/100ML-% IV SOLN
2.0000 g | INTRAVENOUS | Status: AC
  Administered 2021-07-19: 2 g via INTRAVENOUS
  Filled 2021-07-19: qty 100

## 2021-07-19 MED ORDER — ROCURONIUM BROMIDE 10 MG/ML (PF) SYRINGE
PREFILLED_SYRINGE | INTRAVENOUS | Status: DC | PRN
  Administered 2021-07-19 (×2): 50 mg via INTRAVENOUS

## 2021-07-19 MED ORDER — PROPOFOL 10 MG/ML IV BOLUS
INTRAVENOUS | Status: AC
  Filled 2021-07-19: qty 20

## 2021-07-19 MED ORDER — ALUM & MAG HYDROXIDE-SIMETH 200-200-20 MG/5ML PO SUSP
30.0000 mL | Freq: Four times a day (QID) | ORAL | Status: DC | PRN
Start: 2021-07-19 — End: 2021-07-20

## 2021-07-19 MED ORDER — PROPOFOL 10 MG/ML IV BOLUS
INTRAVENOUS | Status: DC | PRN
  Administered 2021-07-19: 120 mg via INTRAVENOUS

## 2021-07-19 MED ORDER — EPHEDRINE SULFATE-NACL 50-0.9 MG/10ML-% IV SOSY
PREFILLED_SYRINGE | INTRAVENOUS | Status: DC | PRN
  Administered 2021-07-19: 5 mg via INTRAVENOUS
  Administered 2021-07-19 (×2): 10 mg via INTRAVENOUS

## 2021-07-19 MED ORDER — TIZANIDINE HCL 4 MG PO TABS: 2.0000 mg | ORAL_TABLET | Freq: Every day | ORAL | Status: DC

## 2021-07-19 MED ORDER — ZOLPIDEM TARTRATE 5 MG PO TABS: 5.0000 mg | ORAL_TABLET | Freq: Every evening | ORAL | Status: DC | PRN

## 2021-07-19 MED ORDER — TIZANIDINE HCL 2 MG PO TABS: 2.0000 mg | ORAL_TABLET | ORAL | Status: DC

## 2021-07-19 MED ORDER — SUMATRIPTAN SUCCINATE 100 MG PO TABS
100.0000 mg | ORAL_TABLET | ORAL | Status: DC | PRN
  Filled 2021-07-19: qty 1

## 2021-07-19 MED ORDER — HEMOSTATIC AGENTS (NO CHARGE) OPTIME
TOPICAL | Status: DC | PRN
  Administered 2021-07-19: 1 via TOPICAL

## 2021-07-19 MED ORDER — BUPIVACAINE-EPINEPHRINE (PF) 0.25% -1:200000 IJ SOLN
INTRAMUSCULAR | Status: AC
  Filled 2021-07-19: qty 30

## 2021-07-19 MED ORDER — FENTANYL CITRATE (PF) 250 MCG/5ML IJ SOLN
INTRAMUSCULAR | Status: DC | PRN
  Administered 2021-07-19: 50 ug via INTRAVENOUS
  Administered 2021-07-19: 100 ug via INTRAVENOUS
  Administered 2021-07-19 (×2): 50 ug via INTRAVENOUS

## 2021-07-19 MED ORDER — LIDOCAINE 2% (20 MG/ML) 5 ML SYRINGE
INTRAMUSCULAR | Status: DC | PRN
Start: 2021-07-19 — End: 2021-07-19
  Administered 2021-07-19: 40 mg via INTRAVENOUS

## 2021-07-19 MED ORDER — PHENOL 1.4 % MT LIQD: 1.0000 | OROMUCOSAL | Status: DC | PRN

## 2021-07-19 MED ORDER — MELATONIN 5 MG PO TABS
30.0000 mg | ORAL_TABLET | Freq: Every day | ORAL | Status: DC
  Administered 2021-07-19: 30 mg via ORAL
  Filled 2021-07-19: qty 6

## 2021-07-19 MED ORDER — SCOPOLAMINE 1 MG/3DAYS TD PT72
MEDICATED_PATCH | TRANSDERMAL | Status: AC
  Filled 2021-07-19: qty 1

## 2021-07-19 MED ORDER — MENTHOL 3 MG MT LOZG: 1.0000 | LOZENGE | OROMUCOSAL | Status: DC | PRN

## 2021-07-19 MED ORDER — GABAPENTIN 100 MG PO CAPS
100.0000 mg | ORAL_CAPSULE | Freq: Two times a day (BID) | ORAL | Status: DC
  Administered 2021-07-19: 100 mg via ORAL
  Filled 2021-07-19: qty 1

## 2021-07-19 MED ORDER — ACETAMINOPHEN 10 MG/ML IV SOLN
INTRAVENOUS | Status: DC | PRN
  Administered 2021-07-19: 1000 mg via INTRAVENOUS

## 2021-07-19 MED ORDER — LIDOCAINE 2% (20 MG/ML) 5 ML SYRINGE
INTRAMUSCULAR | Status: AC
  Filled 2021-07-19: qty 5

## 2021-07-19 MED ORDER — ONDANSETRON HCL 4 MG/2ML IJ SOLN
INTRAMUSCULAR | Status: DC | PRN
  Administered 2021-07-19: 4 mg via INTRAVENOUS

## 2021-07-19 MED ORDER — TIZANIDINE HCL 4 MG PO TABS
4.0000 mg | ORAL_TABLET | Freq: Every day | ORAL | Status: DC
  Administered 2021-07-19: 4 mg via ORAL
  Filled 2021-07-19: qty 1

## 2021-07-19 MED ORDER — ORAL CARE MOUTH RINSE: 15.0000 mL | Freq: Once | OROMUCOSAL | Status: AC

## 2021-07-19 MED ORDER — ONDANSETRON HCL 4 MG/2ML IJ SOLN: 4.0000 mg | Freq: Four times a day (QID) | INTRAMUSCULAR | Status: DC | PRN

## 2021-07-19 MED ORDER — THROMBIN (RECOMBINANT) 20000 UNITS EX SOLR
CUTANEOUS | Status: AC
  Filled 2021-07-19: qty 20000

## 2021-07-19 MED ORDER — PHENYLEPHRINE HCL-NACL 10-0.9 MG/250ML-% IV SOLN
INTRAVENOUS | Status: DC | PRN
  Administered 2021-07-19: 50 ug/min via INTRAVENOUS
  Administered 2021-07-19: 100 ug/min via INTRAVENOUS

## 2021-07-19 MED ORDER — CEFAZOLIN SODIUM-DEXTROSE 2-4 GM/100ML-% IV SOLN
2.0000 g | Freq: Three times a day (TID) | INTRAVENOUS | Status: AC
  Administered 2021-07-19 (×2): 2 g via INTRAVENOUS
  Filled 2021-07-19 (×2): qty 100

## 2021-07-19 MED ORDER — MORPHINE SULFATE (PF) 2 MG/ML IV SOLN: 1.0000 mg | INTRAVENOUS | Status: DC | PRN

## 2021-07-19 MED ORDER — PROMETHAZINE HCL 25 MG/ML IJ SOLN
INTRAMUSCULAR | Status: AC
  Filled 2021-07-19: qty 1

## 2021-07-19 MED ORDER — ACETAMINOPHEN 10 MG/ML IV SOLN
INTRAVENOUS | Status: AC
  Filled 2021-07-19: qty 100

## 2021-07-19 MED ORDER — ACETAMINOPHEN 325 MG PO TABS: 650.0000 mg | ORAL_TABLET | ORAL | Status: DC | PRN

## 2021-07-19 MED ORDER — PROMETHAZINE HCL 25 MG PO TABS
25.0000 mg | ORAL_TABLET | Freq: Four times a day (QID) | ORAL | Status: DC | PRN
  Administered 2021-07-19 (×2): 25 mg via ORAL
  Filled 2021-07-19 (×2): qty 1

## 2021-07-19 MED ORDER — ROCURONIUM BROMIDE 10 MG/ML (PF) SYRINGE
PREFILLED_SYRINGE | INTRAVENOUS | Status: AC
  Filled 2021-07-19: qty 10

## 2021-07-19 MED ORDER — BUPIVACAINE-EPINEPHRINE 0.25% -1:200000 IJ SOLN
INTRAMUSCULAR | Status: DC | PRN
  Administered 2021-07-19: 4 mL

## 2021-07-19 MED ORDER — VALACYCLOVIR HCL 500 MG PO TABS
500.0000 mg | ORAL_TABLET | Freq: Every day | ORAL | Status: DC
  Administered 2021-07-19: 500 mg via ORAL
  Filled 2021-07-19 (×2): qty 1

## 2021-07-19 MED ORDER — SODIUM CHLORIDE 0.9% FLUSH
3.0000 mL | Freq: Two times a day (BID) | INTRAVENOUS | Status: DC
  Administered 2021-07-19 (×2): 3 mL via INTRAVENOUS

## 2021-07-19 MED ORDER — ALBUMIN HUMAN 5 % IV SOLN: INTRAVENOUS | Status: DC | PRN

## 2021-07-19 MED ORDER — SUGAMMADEX SODIUM 200 MG/2ML IV SOLN
INTRAVENOUS | Status: DC | PRN
  Administered 2021-07-19: 200 mg via INTRAVENOUS

## 2021-07-19 MED ORDER — THROMBIN 20000 UNITS EX SOLR
CUTANEOUS | Status: DC | PRN
  Administered 2021-07-19 (×2): 20000 [IU] via TOPICAL

## 2021-07-19 MED ORDER — LACTATED RINGERS IV SOLN: INTRAVENOUS | Status: DC

## 2021-07-19 MED ORDER — CHLORHEXIDINE GLUCONATE 0.12 % MT SOLN
15.0000 mL | Freq: Once | OROMUCOSAL | Status: AC
  Administered 2021-07-19: 15 mL via OROMUCOSAL
  Filled 2021-07-19: qty 15

## 2021-07-19 MED ORDER — 0.9 % SODIUM CHLORIDE (POUR BTL) OPTIME
TOPICAL | Status: DC | PRN
  Administered 2021-07-19: 1000 mL

## 2021-07-19 MED ORDER — ACETAMINOPHEN 10 MG/ML IV SOLN: 1000.0000 mg | Freq: Once | INTRAVENOUS | Status: DC | PRN

## 2021-07-19 MED ORDER — TOPIRAMATE 100 MG PO TABS: 200.0000 mg | ORAL_TABLET | Freq: Every day | ORAL | Status: DC

## 2021-07-19 MED ORDER — POVIDONE-IODINE 7.5 % EX SOLN
Freq: Once | CUTANEOUS | Status: DC
  Filled 2021-07-19: qty 118

## 2021-07-19 MED ORDER — MIDAZOLAM HCL 2 MG/2ML IJ SOLN
INTRAMUSCULAR | Status: AC
  Filled 2021-07-19: qty 2

## 2021-07-19 MED ORDER — ACETAMINOPHEN 650 MG RE SUPP: 650.0000 mg | RECTAL | Status: DC | PRN

## 2021-07-19 MED ORDER — PANTOPRAZOLE SODIUM 40 MG PO TBEC
40.0000 mg | DELAYED_RELEASE_TABLET | Freq: Every day | ORAL | Status: DC
  Administered 2021-07-19: 40 mg via ORAL
  Filled 2021-07-19: qty 1

## 2021-07-19 MED ORDER — EPHEDRINE 5 MG/ML INJ
INTRAVENOUS | Status: AC
  Filled 2021-07-19: qty 5

## 2021-07-19 MED ORDER — HYDROCODONE-ACETAMINOPHEN 10-325 MG PO TABS
1.0000 | ORAL_TABLET | ORAL | Status: DC | PRN
Start: 2021-07-19 — End: 2021-07-20
  Administered 2021-07-20 (×2): 1 via ORAL
  Filled 2021-07-19 (×2): qty 1

## 2021-07-19 MED ORDER — DOCUSATE SODIUM 100 MG PO CAPS
100.0000 mg | ORAL_CAPSULE | Freq: Two times a day (BID) | ORAL | Status: DC
  Administered 2021-07-19: 100 mg via ORAL
  Filled 2021-07-19: qty 1

## 2021-07-19 MED ORDER — FENTANYL CITRATE (PF) 250 MCG/5ML IJ SOLN
INTRAMUSCULAR | Status: AC
  Filled 2021-07-19: qty 5

## 2021-07-19 MED ORDER — HYDROCODONE-ACETAMINOPHEN 5-325 MG PO TABS
1.0000 | ORAL_TABLET | ORAL | Status: DC | PRN
Start: 2021-07-19 — End: 2021-07-20

## 2021-07-19 MED ORDER — PROMETHAZINE HCL 25 MG/ML IJ SOLN
6.2500 mg | INTRAMUSCULAR | Status: DC | PRN
  Administered 2021-07-19: 12.5 mg via INTRAVENOUS

## 2021-07-19 MED ORDER — PANTOPRAZOLE SODIUM 40 MG IV SOLR: 40.0000 mg | Freq: Every day | INTRAVENOUS | Status: DC

## 2021-07-19 MED ORDER — SODIUM CHLORIDE 0.9 % IV SOLN: 250.0000 mL | INTRAVENOUS | Status: DC

## 2021-07-19 MED ORDER — SODIUM CHLORIDE 0.9% FLUSH: 3.0000 mL | INTRAVENOUS | Status: DC | PRN

## 2021-07-19 MED ORDER — ACETAMINOPHEN 500 MG PO TABS: 1000.0000 mg | ORAL_TABLET | Freq: Four times a day (QID) | ORAL | Status: DC | PRN

## 2021-07-19 MED ORDER — FENTANYL CITRATE (PF) 100 MCG/2ML IJ SOLN: 25.0000 ug | INTRAMUSCULAR | Status: DC | PRN

## 2021-07-19 MED ORDER — ONDANSETRON HCL 4 MG PO TABS: 4.0000 mg | ORAL_TABLET | Freq: Four times a day (QID) | ORAL | Status: DC | PRN

## 2021-07-19 MED ORDER — ALENDRONATE SODIUM 70 MG PO TABS: 70.0000 mg | ORAL_TABLET | ORAL | Status: DC

## 2021-07-19 SURGICAL SUPPLY — 80 items
AGENT HMST KT MTR STRL THRMB (HEMOSTASIS) ×1
APL SKNCLS STERI-STRIP NONHPOA (GAUZE/BANDAGES/DRESSINGS) ×1
BAG COUNTER SPONGE SURGICOUNT (BAG) ×3 IMPLANT
BAG SPNG CNTER NS LX DISP (BAG) ×2
BENZOIN TINCTURE PRP APPL 2/3 (GAUZE/BANDAGES/DRESSINGS) ×2 IMPLANT
BIT DRILL NEURO 2X3.1 SFT TUCH (MISCELLANEOUS) ×1 IMPLANT
BIT DRILL SRG 14X2.2XFLT CHK (BIT) IMPLANT
BIT DRL SRG 14X2.2XFLT CHK (BIT) ×1
BLADE CLIPPER SURG (BLADE) ×2 IMPLANT
BLADE SURG 15 STRL LF DISP TIS (BLADE) ×1 IMPLANT
BLADE SURG 15 STRL SS (BLADE) ×2
BONE VIVIGEN FORMABLE 1.3CC (Bone Implant) ×4 IMPLANT
BUR MATCHSTICK NEURO 3.0 LAGG (BURR) IMPLANT
CARTRIDGE OIL MAESTRO DRILL (MISCELLANEOUS) ×1 IMPLANT
CATH FOLEY 2WAY SLVR  5CC 14FR (CATHETERS) ×2
CATH FOLEY 2WAY SLVR 5CC 14FR (CATHETERS) IMPLANT
CLSR STERI-STRIP ANTIMIC 1/2X4 (GAUZE/BANDAGES/DRESSINGS) ×1 IMPLANT
COVER SURGICAL LIGHT HANDLE (MISCELLANEOUS) ×2 IMPLANT
DECANTER SPIKE VIAL GLASS SM (MISCELLANEOUS) ×1 IMPLANT
DIFFUSER DRILL AIR PNEUMATIC (MISCELLANEOUS) ×2 IMPLANT
DRAIN JACKSON RD 7FR 3/32 (WOUND CARE) IMPLANT
DRAPE C-ARM 42X72 X-RAY (DRAPES) ×2 IMPLANT
DRAPE POUCH INSTRU U-SHP 10X18 (DRAPES) ×2 IMPLANT
DRAPE SURG 17X23 STRL (DRAPES) ×6 IMPLANT
DRILL BIT SKYLINE 14MM (BIT) ×2
DRILL NEURO 2X3.1 SOFT TOUCH (MISCELLANEOUS) ×2
DURAPREP 26ML APPLICATOR (WOUND CARE) ×2 IMPLANT
ELECT COATED BLADE 2.86 ST (ELECTRODE) ×2 IMPLANT
ELECT REM PT RETURN 9FT ADLT (ELECTROSURGICAL) ×2
ELECTRODE REM PT RTRN 9FT ADLT (ELECTROSURGICAL) ×1 IMPLANT
EVACUATOR SILICONE 100CC (DRAIN) IMPLANT
GAUZE 4X4 16PLY ~~LOC~~+RFID DBL (SPONGE) ×2 IMPLANT
GAUZE SPONGE 4X4 12PLY STRL (GAUZE/BANDAGES/DRESSINGS) ×2 IMPLANT
GLOVE SRG 8 PF TXTR STRL LF DI (GLOVE) ×1 IMPLANT
GLOVE SURG ENC MOIS LTX SZ7 (GLOVE) ×2 IMPLANT
GLOVE SURG ENC MOIS LTX SZ8 (GLOVE) ×2 IMPLANT
GLOVE SURG UNDER POLY LF SZ7 (GLOVE) ×4 IMPLANT
GLOVE SURG UNDER POLY LF SZ8 (GLOVE) ×2
GOWN STRL REUS W/ TWL LRG LVL3 (GOWN DISPOSABLE) ×1 IMPLANT
GOWN STRL REUS W/ TWL XL LVL3 (GOWN DISPOSABLE) ×1 IMPLANT
GOWN STRL REUS W/TWL LRG LVL3 (GOWN DISPOSABLE) ×2
GOWN STRL REUS W/TWL XL LVL3 (GOWN DISPOSABLE) ×2
GRAFT BNE MATRIX VG FRMBL SM 1 (Bone Implant) IMPLANT
INTERLOCK LRDTC CRVCL VBR 6MM (Bone Implant) IMPLANT
INTERLOCK LRDTC CRVCL VBR SM (Bone Implant) IMPLANT
IV CATH 14GX2 1/4 (CATHETERS) ×2 IMPLANT
KIT BASIN OR (CUSTOM PROCEDURE TRAY) ×2 IMPLANT
KIT TURNOVER KIT B (KITS) ×2 IMPLANT
LORDOTIC CERVICAL VBR 6MM SM (Bone Implant) ×2 IMPLANT
LORDOTIC CERVICAL VBR SM 5MM (Bone Implant) ×4 IMPLANT
MANIFOLD NEPTUNE II (INSTRUMENTS) IMPLANT
NDL PRECISIONGLIDE 27X1.5 (NEEDLE) ×1 IMPLANT
NDL SPNL 20GX3.5 QUINCKE YW (NEEDLE) ×1 IMPLANT
NEEDLE PRECISIONGLIDE 27X1.5 (NEEDLE) ×2 IMPLANT
NEEDLE SPNL 20GX3.5 QUINCKE YW (NEEDLE) ×2 IMPLANT
NS IRRIG 1000ML POUR BTL (IV SOLUTION) ×2 IMPLANT
OIL CARTRIDGE MAESTRO DRILL (MISCELLANEOUS) ×2
PACK ORTHO CERVICAL (CUSTOM PROCEDURE TRAY) ×2 IMPLANT
PAD ARMBOARD 7.5X6 YLW CONV (MISCELLANEOUS) ×4 IMPLANT
PATTIES SURGICAL .5 X.5 (GAUZE/BANDAGES/DRESSINGS) ×6 IMPLANT
PATTIES SURGICAL .5 X1 (DISPOSABLE) IMPLANT
PIN DISTRACTION 14 (PIN) ×2 IMPLANT
PLATE SKYLINE 3LVL 42MM (Plate) ×1 IMPLANT
POSITIONER HEAD DONUT 9IN (MISCELLANEOUS) ×2 IMPLANT
SCREW SKYLINE VAR OS 14MM (Screw) ×8 IMPLANT
SPONGE INTESTINAL PEANUT (DISPOSABLE) ×4 IMPLANT
SPONGE SURGIFOAM ABS GEL 100 (HEMOSTASIS) ×2 IMPLANT
STRIP CLOSURE SKIN 1/2X4 (GAUZE/BANDAGES/DRESSINGS) ×2 IMPLANT
SURGIFLO W/THROMBIN 8M KIT (HEMOSTASIS) ×2 IMPLANT
SUT MNCRL AB 4-0 PS2 18 (SUTURE) ×2 IMPLANT
SUT VIC AB 2-0 CT2 18 VCP726D (SUTURE) ×2 IMPLANT
SYR BULB IRRIG 60ML STRL (SYRINGE) ×2 IMPLANT
SYR CONTROL 10ML LL (SYRINGE) ×4 IMPLANT
TAPE CLOTH 4X10 WHT NS (GAUZE/BANDAGES/DRESSINGS) ×2 IMPLANT
TAPE UMBILICAL COTTON 1/8X30 (MISCELLANEOUS) ×2 IMPLANT
TOWEL GREEN STERILE (TOWEL DISPOSABLE) ×2 IMPLANT
TOWEL GREEN STERILE FF (TOWEL DISPOSABLE) ×2 IMPLANT
TRAY FOLEY MTR SLVR 16FR STAT (SET/KITS/TRAYS/PACK) ×1 IMPLANT
WATER STERILE IRR 1000ML POUR (IV SOLUTION) ×2 IMPLANT
YANKAUER SUCT BULB TIP NO VENT (SUCTIONS) ×2 IMPLANT

## 2021-07-19 NOTE — H&P (Signed)
PREOPERATIVE H&P  Chief Complaint: Bilateral arm pain  HPI: [name] is a 65 y.o. female who presents with ongoing pain in the bilateral arms  MRI reveals spinal stenosis spanning C4-C7  Patient has failed multiple forms of conservative care and continues to have pain (see office notes for additional details regarding the patient's full course of treatment)  Past Medical History:  Diagnosis Date   ADHD    Anxiety    Arthritis    Carpal tunnel syndrome of left wrist    Complication of anesthesia    states she had to much and once adn ended up on o2, no problems since   Contracture of muscle of right upper arm    limited rom   Cubital tunnel syndrome on left    Depression    Dyspnea    with activity   GERD (gastroesophageal reflux disease)    History of kidney stones    History of suicide attempt 2012--  XANAX OVERDOSE   Hypotension    reports that her SBP can run < 90   Migraine    Personality disorder ([place])    Proximal humerus fracture    right   UTI (urinary tract infection)    on cipro   Vitamin B12 deficiency 03/26/2018   Past Surgical History:  Procedure Laterality Date   BACK SURGERY     CARPAL TUNNEL RELEASE Left 05/13/2013   Procedure: CARPAL TUNNEL RELEASE ENDOSCOPIC;  Surgeon: Jolyn Nap, MD;  Location: [facility];  Service: Orthopedics;  Laterality: Left;  Incision at 0955.    CERVICAL BIOPSY  W/ LOOP ELECTRODE EXCISION  12/01/2008   CIN II   EYE SURGERY     FEMORAL HERNIA REPAIR Left 10/31/2009   AND INGUINAL LYMPH NODE BX   LUMBAR [place] SURGERY  X3   LAST ONE 1989   FUSION   NERVE REPAIR Left 05/13/2013   Procedure: ULNAR NEUROPLASTY AT ELBOW;  Surgeon: Jolyn Nap, MD;  Location: [facility];  Service: Orthopedics;  Laterality: Left;   ORIF HUMERUS FRACTURE Right 09/11/2019   Procedure: OPEN REDUCTION INTERNAL FIXATION (ORIF) HUMERAL FRACTURE;  Surgeon: Renette Butters, MD;  Location:  [facility];  Service: Orthopedics;  Laterality: Right;   REPAIR PARTIAL FINGER AMPUTATION  1989   TONSILLECTOMY     TOTAL HIP ARTHROPLASTY Right 10/23/2016   TOTAL HIP ARTHROPLASTY Right 10/23/2016   Procedure: TOTAL HIP ARTHROPLASTY ANTERIOR APPROACH;  Surgeon: Renette Butters, MD;  Location: [facility];  Service: Orthopedics;  Laterality: Right;   Social History   Socioeconomic History   Marital status: Divorced    Spouse name: Not on file   Number of children: Not on file   Years of education: Not on file   Highest education level: Not on file  Occupational History   Not on file  Tobacco Use   Smoking status: Former   Smokeless tobacco: Never   Tobacco comments:    quit smoking 25 years ago  Vaping Use   Vaping Use: Never used  Substance and Sexual Activity   Alcohol use: No   Drug use: Yes    Types: Marijuana    Comment: > 1 mo ago   Sexual activity: Not on file  Other Topics Concern   Not on file  Social History Narrative   Lives alone   Caffeine use: sometimes soda   Right handed    Social Determinants of Health  Financial Resource Strain: Not on file  Food Insecurity: Not on file  Transportation Needs: Not on file  Physical Activity: Not on file  Stress: Not on file  Social Connections: Not on file   Family History  Problem Relation Age of Onset   Breast cancer Mother    Allergies  Allergen Reactions   Carbamazepine Other (See Comments) and Rash   Vortioxetine Rash and Other (See Comments)    headache   Benzodiazepines     Pt preference due to withdrawals    Citalopram     All SSRI's give her fever, insomnia   Other     All antibiotics give her a yeast infection   Zocor [Simvastatin] Anxiety    nervous   Prior to Admission medications   Medication Sig Start Date End Date Taking? Authorizing Provider  acetaminophen (TYLENOL) 500 MG tablet Take 1,000 mg by mouth every 6 (six) hours as needed for moderate pain.   Yes [provider]  diclofenac Sodium (VOLTAREN) 1 % GEL Apply 1 application topically 4 (four) times daily as needed (pain).   Yes [provider]  gabapentin (NEURONTIN) 100 MG capsule Take 100 mg by mouth in the morning and at bedtime. 11/29/20  Yes [provider]  MELATONIN PO Take 30 mg by mouth at bedtime. 08/30/20  Yes [provider]  pantoprazole (PROTONIX) 40 MG tablet Take 40 mg by mouth daily.   Yes [provider]  tiZANidine (ZANAFLEX) 2 MG tablet Take 2-4 mg by mouth See admin instructions. 2 mg in the morning, 4 mg at bedtime   Yes [provider]  topiramate (TOPAMAX) 50 MG tablet Take 200 mg by mouth daily.   Yes [provider]  traMADol (ULTRAM) 50 MG tablet Take 50 mg by mouth 3 (three) times daily as needed for severe pain.   Yes [provider]  valACYclovir (VALTREX) 500 MG tablet Take 500 mg by mouth at bedtime.   Yes [provider]  alendronate (FOSAMAX) 70 MG tablet Take 70 mg by mouth once a week. 09/21/20   [provider]  promethazine (PHENERGAN) 25 MG tablet Take 1 tablet (25 mg total) by mouth every 6 (six) hours as needed for nausea or vomiting. 04/13/20   Ward, Delice Bison, DO  SUMAtriptan (IMITREX) 100 MG tablet Take 100 mg by mouth every 2 (two) hours as needed for migraine. May repeat in 2 hours if headache persists or recurs.    [provider]     All other systems have been reviewed and were otherwise negative with the exception of those mentioned in the HPI and as above.  Physical Exam: Vitals:   07/19/21 0559  BP: 105/69  Pulse: 73  Resp: 17  Temp: 98.2 F (36.8 C)  SpO2: 100%    Body mass index is 20.78 kg/m.  General: Alert, no acute distress Cardiovascular: No pedal edema Respiratory: No cyanosis, no use of accessory musculature Skin: No lesions in the area of chief complaint Neurologic: Sensation intact distally Psychiatric: Patient is competent for  consent with normal mood and affect Lymphatic: No axillary or cervical lymphadenopathy   Assessment/Plan: CERVICAL RADICULOPATHY Plan for Procedure(s): ANTERIOR CERVICAL DECOMPRESSION FUSION CERVICAL 4- CERVICAL 5, CERVICAL 5- CERVICAL 6, CERVICAL 6- CERVICAL 7 WITH INSTRUMENTATION AND ALLOGRAFT   Norva Karvonen, MD 07/19/2021 6:40 AM

## 2021-07-19 NOTE — Progress Notes (Signed)
PHARMACIST - PHYSICIAN COMMUNICATION  CONCERNING: P&T Medication Policy Regarding Oral Bisphosphonates  RECOMMENDATION: Your order for alendronate (Fosamax), ibandronate (Boniva), or risedronate (Actonel) has been discontinued at this time.  If the patient's post-hospital medical condition warrants safe use of this class of drugs, please resume the pre-hospital regimen upon discharge.  DESCRIPTION:  Alendronate (Fosamax), ibandronate (Boniva), and risedronate (Actonel) can cause severe esophageal erosions in patients who are unable to remain upright at least 30 minutes after taking this medication.   Since brief interruptions in therapy are thought to have minimal impact on bone mineral density, the Pharmacy & Therapeutics Committee has established that bisphosphonate orders should be routinely discontinued during hospitalization.   To override this safety policy and permit administration of Boniva, Fosamax, or Actonel in the hospital, prescribers must write "DO NOT HOLD" in the comments section when placing the order for this class of medications.   Toneshia Coello S. Keeghan Bialy, PharmD, BCPS Clinical Staff Pharmacist [email or web address] 

## 2021-07-19 NOTE — Op Note (Signed)
PATIENT NAME: [name] [facility]   MEDICAL RECORD NO.:   [id number]    DATE OF BIRTH: [birth year]   DATE OF PROCEDURE: 07/19/2021                               OPERATIVE REPORT     PREOPERATIVE DIAGNOSES: 1. Bilateral cervical radiculopathy. 2. Spinal stenosis spanning C4-C7.   POSTOPERATIVE DIAGNOSES: 1. Bilateral cervical radiculopathy. 2. Spinal stenosis spanning C4-C7.   PROCEDURE: 1. Anterior cervical decompression and fusion C4/5, C5/6, C6/7. 2. Placement of anterior instrumentation, C4-C7. 3. Insertion of interbody device x 3 (Titan intervertebral spacers). 4. Intraoperative use of fluoroscopy. 5. Use of morselized allograft - ViviGen.   SURGEON:  Phylliss Bob, MD   ASSISTANT:  Pricilla Holm, PA-C.   ANESTHESIA:  General endotracheal anesthesia.   COMPLICATIONS:  None.   DISPOSITION:  Stable.   ESTIMATED BLOOD LOSS:  Minimal.   INDICATIONS FOR SURGERY:  Briefly, [name] is a pleasant 65 y.o. year- old patient, who did present to me with severe pain in the neck and bilateral Arms.  [name] MRI did reveal the findings noted above.  Given the patient's ongoing rather debilitating pain and lack of improvement with appropriate treatment measures, we did discuss proceeding with the procedure noted above.  The patient was fully aware of the risks and limitations of surgery as outlined in my preoperative note.   OPERATIVE DETAILS:  On 07/19/2021,  the patient was brought to surgery and general endotracheal anesthesia was administered.  The patient was placed supine on the hospital bed. The neck was gently extended.  All bony prominences were meticulously padded.  The neck was prepped and draped in the usual sterile fashion.  At this point, I did make a left-sided transverse incision.  The platysma was incised.  A Smith-Robinson approach was used and the anterior spine was identified. A self-retaining retractor was placed.  I then subperiosteally  exposed the vertebral bodies from C4-C7.  Caspar pins were then placed into the C6 and C7 vertebral bodies and distraction was applied.  A thorough and complete C6-7 intervertebral diskectomy was performed.  The posterior longitudinal ligament was identified and entered using a nerve hook.  I then used #1 followed by #2 Kerrison to perform a thorough and complete intervertebral diskectomy.  The spinal canal was thoroughly decompressed, as was the right and left neuroforamen.  The endplates were then prepared and the appropriate-sized intervertebral spacer was then packed with ViviGen and tamped into position in the usual fashion.  The lower Caspar pin was then removed and placed into the C5 vertebral body and once again, distraction was applied across the C5-6 intervertebral space.  I then again performed a thorough and complete diskectomy, thoroughly decompressing the spinal canal and bilateral neuroforamena.  After preparing the endplates, the appropriate-sized intervertebral spacer was packed with ViviGen and tamped into position.  The lower Caspar pin was then removed and placed into the C4 vertebral body and once again, distraction was applied across the C4-5 intervertebral space.  I then again performed a thorough and complete diskectomy, thoroughly decompressing the spinal canal and bilateral neuroforamena.  After preparing the endplates, the appropriate-sized intervertebral spacer was packed with ViviGen and tamped into position.  The Caspar pins then were removed and bone wax was placed in their place.  The appropriate-sized anterior cervical plate was placed over the anterior spine.  14 mm variable angle screws were placed, 2  in each vertebral body from C4-C7 for a total of 8 vertebral body screws.  The screws were then locked to the plate using the Cam locking mechanism.  I was very pleased with the final fluoroscopic images.  The wound was then irrigated.  The wound was then  explored for any undue bleeding and there was no bleeding noted. The wound was then closed in layers using 2-0 Vicryl, followed by 4-0 Monocryl.  Benzoin and Steri-Strips were applied, followed by sterile dressing.  All instrument counts were correct at the termination of the procedure.   Of note, Pricilla Holm, PA-C, was my assistant throughout surgery, and did aid in retraction, suctioning, placement of the hardware, and closure from start to finish.         Phylliss Bob, MD

## 2021-07-19 NOTE — Anesthesia Postprocedure Evaluation (Signed)
Anesthesia Post Note  Patient: [name] [facility]  Procedure(s) Performed: ANTERIOR CERVICAL DECOMPRESSION FUSION CERVICAL 4- CERVICAL 5, CERVICAL 5- CERVICAL 6, CERVICAL 6- CERVICAL 7 WITH INSTRUMENTATION AND ALLOGRAFT     Patient location during evaluation: PACU Anesthesia Type: General Level of consciousness: awake and alert Pain management: pain level controlled Vital Signs Assessment: post-procedure vital signs reviewed and stable Respiratory status: spontaneous breathing, nonlabored ventilation, respiratory function stable and patient connected to nasal cannula oxygen Cardiovascular status: blood pressure returned to baseline and stable Postop Assessment: no apparent nausea or vomiting Anesthetic complications: no   No notable events documented.  Last Vitals:  Vitals:   07/19/21 1245 07/19/21 1509  BP: 116/61 (!) 108/49  Pulse: 73 (!) 57  Resp: 17 18  Temp:  36.9 C  SpO2: 100% 100%    Last Pain:  Vitals:   07/19/21 1509  TempSrc: Oral  PainSc:                  March Rummage Nafisah Runions

## 2021-07-19 NOTE — Transfer of Care (Signed)
Immediate Anesthesia Transfer of Care Note  Patient: [name] [facility]  Procedure(s) Performed: ANTERIOR CERVICAL DECOMPRESSION FUSION CERVICAL 4- CERVICAL 5, CERVICAL 5- CERVICAL 6, CERVICAL 6- CERVICAL 7 WITH INSTRUMENTATION AND ALLOGRAFT  Patient Location: PACU  Anesthesia Type:General  Level of Consciousness: drowsy, patient cooperative and responds to stimulation  Airway & Oxygen Therapy: Patient Spontanous Breathing and Patient connected to nasal cannula oxygen  Post-op Assessment: Report given to RN and Post -op Vital signs reviewed and stable  Post vital signs: Reviewed and stable  Last Vitals:  Vitals Value Taken Time  BP 110/56 07/19/21 1055  Temp    Pulse 84 07/19/21 1057  Resp 21 07/19/21 1057  SpO2 100 % 07/19/21 1057  Vitals shown include unvalidated device data.  Last Pain:  Vitals:   07/19/21 0616  TempSrc:   PainSc: 6       Patients Stated Pain Goal: 3 ([id number])  Complications: No notable events documented.

## 2021-07-19 NOTE — Anesthesia Procedure Notes (Signed)
Procedure Name: Intubation Date/Time: 07/19/2021 7:51 AM Performed by: Michele Rockers, CRNA Pre-anesthesia Checklist: Patient identified, Patient being monitored, Timeout performed, Emergency Drugs available and Suction available Patient Re-evaluated:Patient Re-evaluated prior to induction Oxygen Delivery Method: Circle System Utilized Preoxygenation: Pre-oxygenation with 100% oxygen Induction Type: IV induction Ventilation: Mask ventilation without difficulty Laryngoscope Size: Miller and 2 Grade View: Grade I Tube type: Oral Tube size: 7.0 mm Number of attempts: 1 Airway Equipment and Method: Stylet Placement Confirmation: ETT inserted through vocal cords under direct vision, positive ETCO2 and breath sounds checked- equal and bilateral Secured at: 21 cm Tube secured with: Tape Dental Injury: Teeth and Oropharynx as per pre-operative assessment

## 2021-07-20 ENCOUNTER — Encounter (HOSPITAL_COMMUNITY): Payer: Self-pay | Admitting: Orthopedic Surgery

## 2021-07-20 DIAGNOSIS — M5412 Radiculopathy, cervical region: Secondary | ICD-10-CM | POA: Diagnosis not present

## 2021-07-20 MED ORDER — TIZANIDINE HCL 4 MG PO TABS: 4.0000 mg | ORAL_TABLET | Freq: Every day | ORAL | 0 refills | Status: DC

## 2021-07-20 MED ORDER — HYDROCODONE-ACETAMINOPHEN 5-325 MG PO TABS: 1.0000 | ORAL_TABLET | ORAL | 0 refills | Status: DC | PRN

## 2021-07-20 NOTE — Progress Notes (Signed)
    Patient doing well  Denies arm pain Tolerating PO well   Physical Exam: Vitals:   07/19/21 2311 07/20/21 0431  BP: 110/61 (!) 105/57  Pulse: 72 69  Resp: 18 18  Temp: 98.8 F (37.1 C) 99.5 F (37.5 C)  SpO2: 100% 98%    Neck soft/supple Dressing in place NVI  POD #1 s/p ACDF, doing well  - encourage ambulation - Percocet for pain, Robaxin for muscle spasms - d/c home today with f/u in 2 weeks

## 2021-07-20 NOTE — Progress Notes (Signed)
Patient was transported via wheelchair by RN for discharge home; in no acute distress nor complaints of pain nor discomfort; room was checked and accounted for her belongings; discharge instructions given to patient by RN and she verbalized understanding on the instructions given.

## 2021-07-21 LAB — TYPE AND SCREEN
ABO/RH(D): A POS
Antibody Screen: POSITIVE
Donor AG Type: NEGATIVE
Donor AG Type: NEGATIVE
PT AG Type: NEGATIVE
Unit division: 0
Unit division: 0

## 2021-07-21 LAB — BPAM RBC
Blood Product Expiration Date: 202209022359
Blood Product Expiration Date: 202209032359
Unit Type and Rh: 6200
Unit Type and Rh: 6200

## 2021-07-25 ENCOUNTER — Encounter (HOSPITAL_COMMUNITY): Payer: Self-pay | Admitting: Orthopedic Surgery

## 2021-07-27 NOTE — Discharge Summary (Signed)
Patient ID: [name] MRN: [id number] DOB/AGE: January 25, 1956 65 y.o.  Admit date: 07/19/2021 Discharge date: 07/20/2021  Admission Diagnoses:  Active Problems:   Radiculopathy   Discharge Diagnoses:  Same  Past Medical History:  Diagnosis Date   ADHD    Anxiety    Arthritis    Carpal tunnel syndrome of left wrist    Complication of anesthesia    states she had to much and once adn ended up on o2, no problems since   Contracture of muscle of right upper arm    limited rom   Cubital tunnel syndrome on left    Depression    Dyspnea    with activity   GERD (gastroesophageal reflux disease)    History of kidney stones    History of suicide attempt 2012--  XANAX OVERDOSE   Hypotension    reports that her SBP can run < 90   Migraine    Personality disorder (HCC)    Proximal humerus fracture    right   UTI (urinary tract infection)    on cipro   Vitamin B12 deficiency 03/26/2018    Surgeries: Procedure(s): ANTERIOR CERVICAL DECOMPRESSION FUSION CERVICAL 4- CERVICAL 5, CERVICAL 5- CERVICAL 6, CERVICAL 6- CERVICAL 7 WITH INSTRUMENTATION AND ALLOGRAFT on 07/19/2021   Consultants: None  Discharged Condition: Improved  Hospital Course: [name] is an 65 y.o. female who was admitted 07/19/2021 for operative treatment of radiculopathy. Patient has severe unremitting pain that affects sleep, daily activities, and work/hobbies. After pre-op clearance the patient was taken to the operating room on 07/19/2021 and underwent  Procedure(s): ANTERIOR CERVICAL DECOMPRESSION FUSION CERVICAL 4- CERVICAL 5, CERVICAL 5- CERVICAL 6, CERVICAL 6- CERVICAL 7 WITH INSTRUMENTATION AND ALLOGRAFT.    Patient was given perioperative antibiotics:  Anti-infectives (From admission, onward)    Start     Dose/Rate Route Frequency Ordered Stop   07/19/21 2200  valACYclovir (VALTREX) tablet 500 mg  Status:  Discontinued        500 mg Oral Daily at bedtime 07/19/21 1403 07/20/21 1634    07/19/21 1545  ceFAZolin (ANCEF) IVPB 2g/100 mL premix        2 g 200 mL/hr over 30 Minutes Intravenous Every 8 hours 07/19/21 1403 07/20/21 0013   07/19/21 0615  ceFAZolin (ANCEF) IVPB 2g/100 mL premix        2 g 200 mL/hr over 30 Minutes Intravenous On call to O.R. 07/19/21 0606 07/19/21 0745        Patient was given sequential compression devices, early ambulation to prevent DVT.  Patient benefited maximally from hospital stay and there were no complications.    Recent vital signs: BP (!) 95/53 (BP Location: Right Arm)   Pulse (!) 59   Temp 99.5 F (37.5 C) (Oral) Comment: pt drinking coffee  Resp 16   Ht '5\' 1"'$  (1.549 m)   Wt 49.9 kg   SpO2 99%   BMI 20.78 kg/m    Discharge Medications:   Allergies as of 07/20/2021       Reactions   Carbamazepine Other (See Comments), Rash   Vortioxetine Rash, Other (See Comments)   headache   Benzodiazepines    Pt preference due to withdrawals    Citalopram    All SSRI's give her fever, insomnia   Other    All antibiotics give her a yeast infection   Zocor [simvastatin] Anxiety   nervous        Medication List  TAKE these medications    acetaminophen 500 MG tablet Commonly known as: TYLENOL Take 1,000 mg by mouth every 6 (six) hours as needed for moderate pain.   alendronate 70 MG tablet Commonly known as: FOSAMAX Take 70 mg by mouth once a week.   diclofenac Sodium 1 % Gel Commonly known as: VOLTAREN Apply 1 application topically 4 (four) times daily as needed (pain).   gabapentin 100 MG capsule Commonly known as: NEURONTIN Take 100 mg by mouth in the morning and at bedtime.   HYDROcodone-acetaminophen 5-325 MG tablet Commonly known as: NORCO/VICODIN Take 1 tablet by mouth every 4 (four) hours as needed for moderate pain ((score 4 to 6)).   MELATONIN PO Take 30 mg by mouth at bedtime.   pantoprazole 40 MG tablet Commonly known as: PROTONIX Take 40 mg by mouth daily.   promethazine 25 MG  tablet Commonly known as: PHENERGAN Take 1 tablet (25 mg total) by mouth every 6 (six) hours as needed for nausea or vomiting.   SUMAtriptan 100 MG tablet Commonly known as: IMITREX Take 100 mg by mouth every 2 (two) hours as needed for migraine. May repeat in 2 hours if headache persists or recurs.   tiZANidine 2 MG tablet Commonly known as: ZANAFLEX Take 2-4 mg by mouth See admin instructions. 2 mg in the morning, 4 mg at bedtime What changed: Another medication with the same name was added. Make sure you understand how and when to take each.   tiZANidine 4 MG tablet Commonly known as: ZANAFLEX Take 1 tablet (4 mg total) by mouth at bedtime. What changed: You were already taking a medication with the same name, and this prescription was added. Make sure you understand how and when to take each.   topiramate 50 MG tablet Commonly known as: TOPAMAX Take 200 mg by mouth daily.   traMADol 50 MG tablet Commonly known as: ULTRAM Take 50 mg by mouth 3 (three) times daily as needed for severe pain.   valACYclovir 500 MG tablet Commonly known as: VALTREX Take 500 mg by mouth at bedtime.        Diagnostic Studies: DG Cervical Spine 1 View  Result Date: 07/19/2021 CLINICAL DATA:  Surgery, elective Z41.9 (ICD-10-CM). Additional history provided: C4-C7 ACDF. Provided fluoroscopy time 15 seconds (1.34 mGy). EXAM: DG CERVICAL SPINE - 1 VIEW; DG C-ARM 1-60 MIN COMPARISON:  Cervical spine MRI 02/07/2021. FINDINGS: A single lateral view intraoperative fluoroscopic image of the cervical spine is submitted. On the provided image, ACDF hardware is present at the C4-C7 levels (ventral plate and screws, as well as interbody devices). Superimposition of the shoulder soft tissues limits evaluation of the C7 level. Curvilinear hyperdensity projects ventral to the cervical spine at the operative levels, likely reflecting surgical sponges/packing material. Partially visualized ET tube. IMPRESSION: Single  lateral view intraoperative fluoroscopic image of the cervical spine from reported C4-C7 ACDF. Curvilinear hyperdensity, likely reflecting surgical sponges/packing material, projects ventral to the cervical spine at the operative levels. Correlate with the operative history. Electronically Signed   By: Kellie Simmering DO   On: 07/19/2021 10:44   DG C-Arm 1-60 Min  Result Date: 07/19/2021 CLINICAL DATA:  Surgery, elective Z41.9 (ICD-10-CM). Additional history provided: C4-C7 ACDF. Provided fluoroscopy time 15 seconds (1.34 mGy). EXAM: DG CERVICAL SPINE - 1 VIEW; DG C-ARM 1-60 MIN COMPARISON:  Cervical spine MRI 02/07/2021. FINDINGS: A single lateral view intraoperative fluoroscopic image of the cervical spine is submitted. On the provided image, ACDF hardware is present at  the C4-C7 levels (ventral plate and screws, as well as interbody devices). Superimposition of the shoulder soft tissues limits evaluation of the C7 level. Curvilinear hyperdensity projects ventral to the cervical spine at the operative levels, likely reflecting surgical sponges/packing material. Partially visualized ET tube. IMPRESSION: Single lateral view intraoperative fluoroscopic image of the cervical spine from reported C4-C7 ACDF. Curvilinear hyperdensity, likely reflecting surgical sponges/packing material, projects ventral to the cervical spine at the operative levels. Correlate with the operative history. Electronically Signed   By: Kellie Simmering DO   On: 07/19/2021 10:44    Disposition: Discharge disposition: 01-Home or Self Care        POD #1 s/p ACDF, doing well   - encourage ambulation - Percocet for pain, Robaxin for muscle spasms -Scripts for pain sent to pharmacy electronically  -D/C instructions sheet printed and in chart -D/C today  -F/U in office 2 weeks   Signed: Lennie Muckle Jaeleah Smyser 07/27/2021, 10:30 AM

## 2021-08-08 ENCOUNTER — Ambulatory Visit: Payer: Medicare HMO

## 2021-08-20 IMAGING — CT CT RENAL STONE PROTOCOL
3 of 7 series · 15 of 46 positions shown, 17 images · non-contrast
Comparison: None.

CLINICAL DATA: Right flank pain. Also fall 03/23/2020 with right
hip pain.

EXAM:
CT ABDOMEN AND PELVIS WITHOUT CONTRAST
CT HIP RIGHT
TECHNIQUE: Multidetector CT imaging of the abdomen and pelvis was performed
following the standard protocol without IV contrast.
Multidetector CT imaging of the right hip was performed following
standard protocol without IV contrast.

[Series 2: axial st · axial · 0.68mm/px · z∈[-459,-94]mm · 9 of 93 slices shown, 11 images]
[im 10/93  soft-tissue]
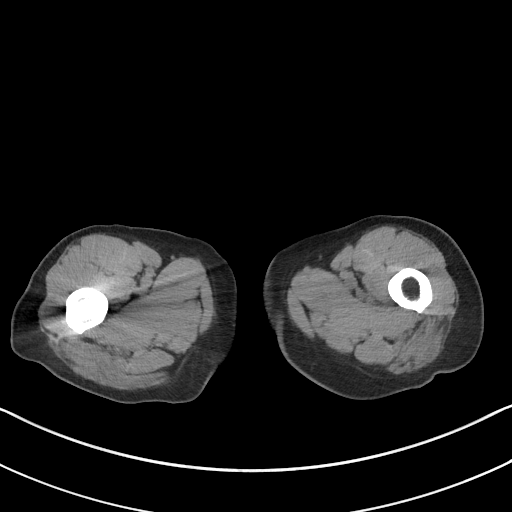
[im 10/93  bone]
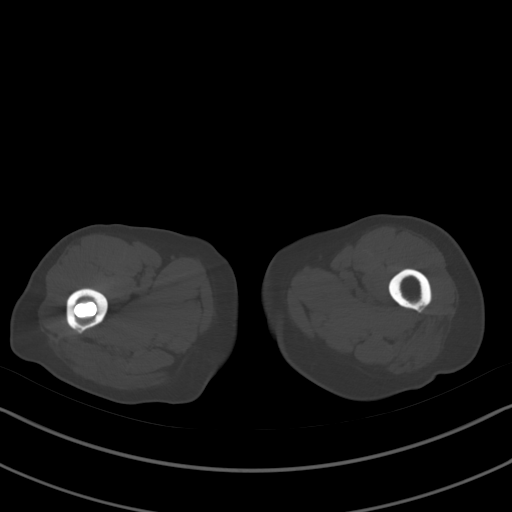
[im 19/93  soft-tissue]
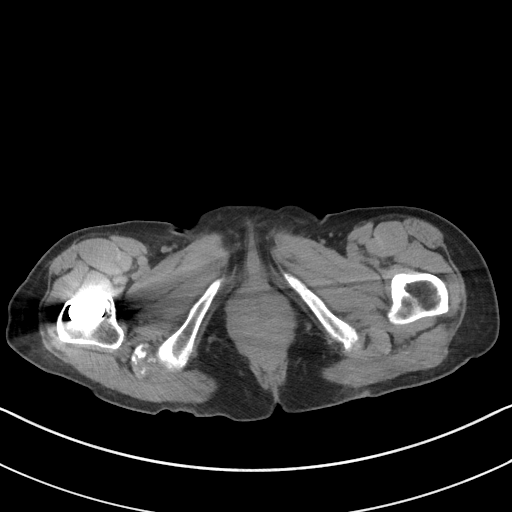
[im 28/93  soft-tissue]
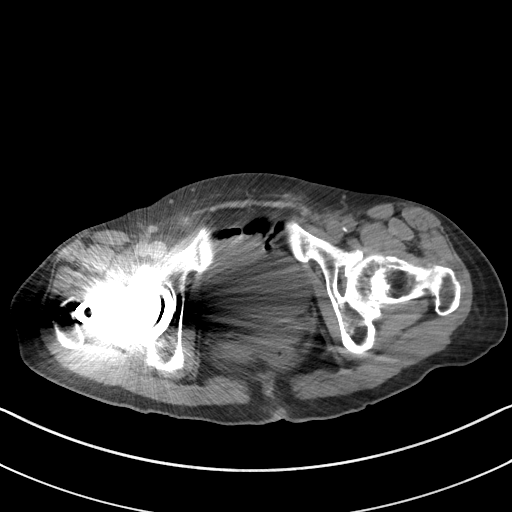
[im 37/93  soft-tissue]
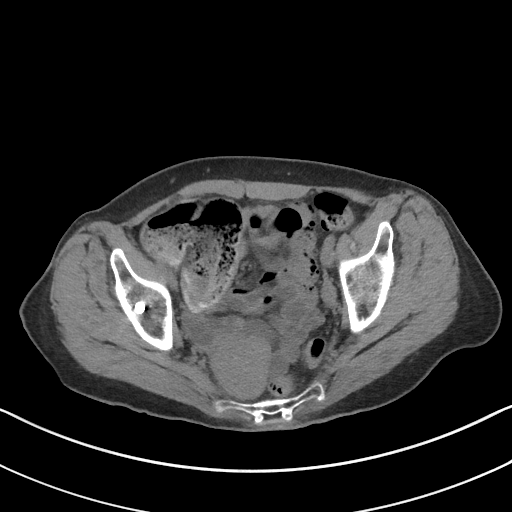
[im 47/93  soft-tissue]
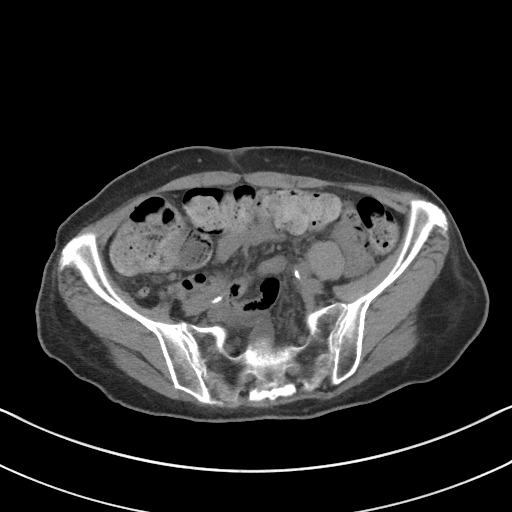
[im 56/93  soft-tissue]
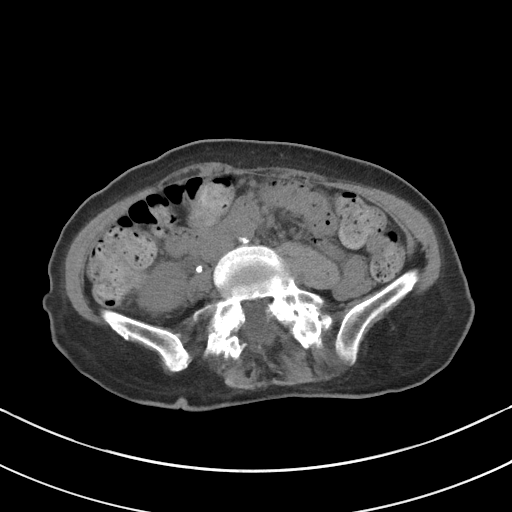
[im 65/93  soft-tissue]
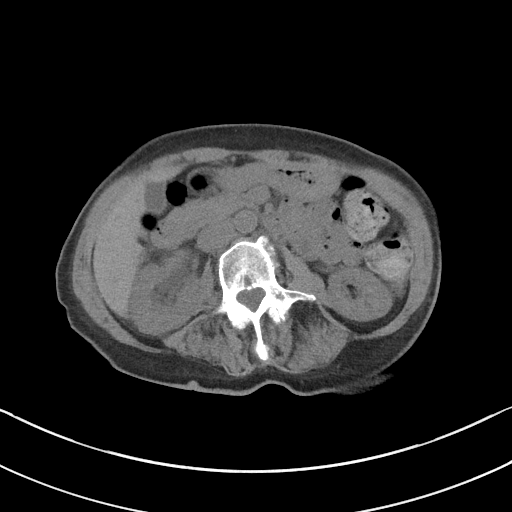
[im 74/93  soft-tissue]
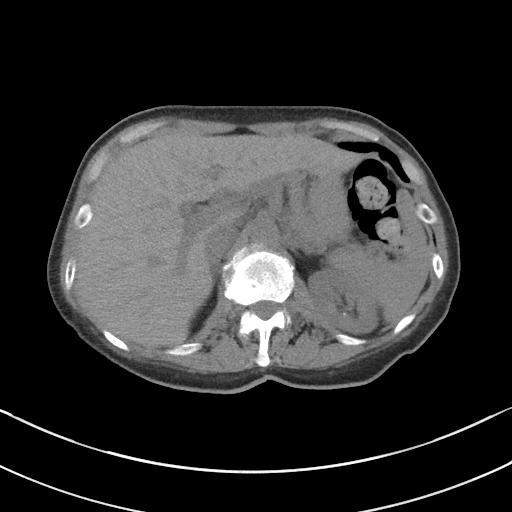
[im 83/93  soft-tissue]
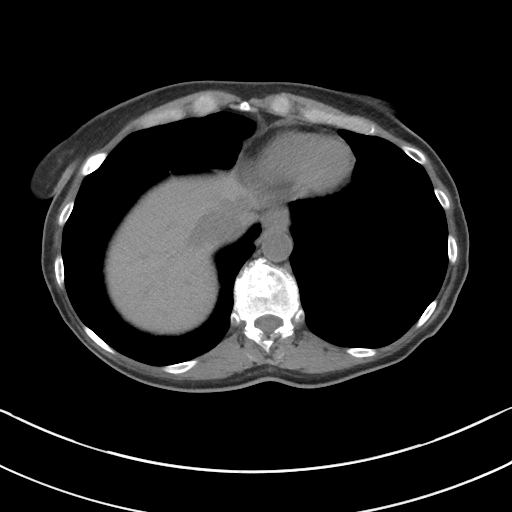
[im 83/93  bone]
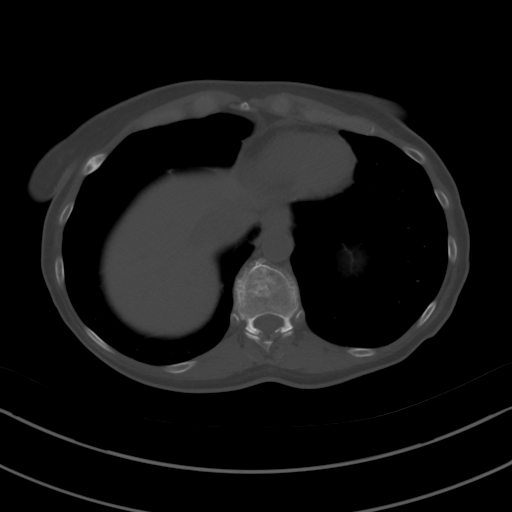

[Series 4: coronal st · coronal · 0.81mm/px · 3 of 78 slices shown]
[im 16/78  soft-tissue]
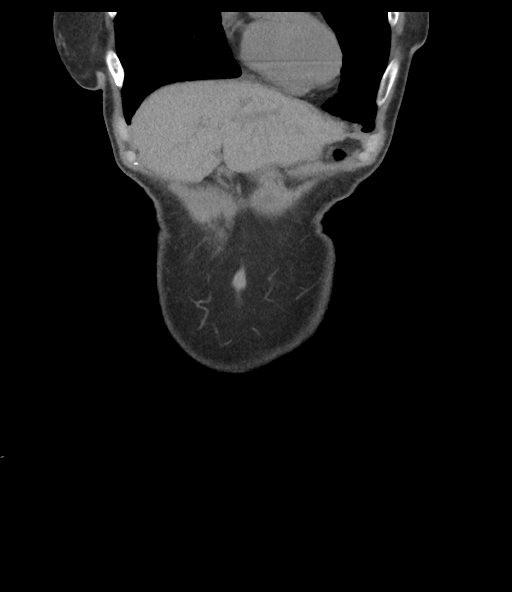
[im 31/78  soft-tissue]
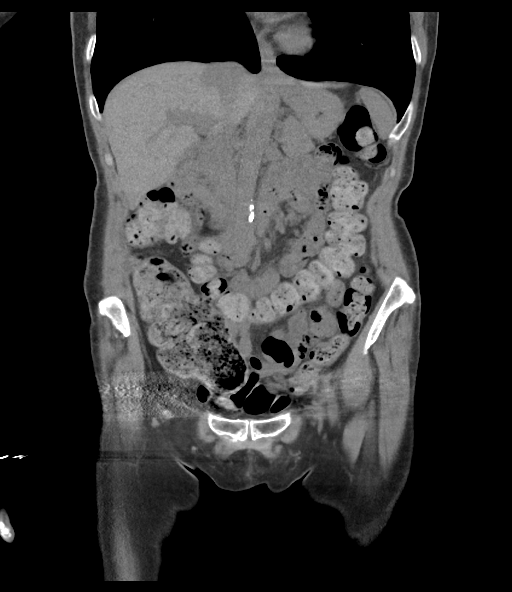
[im 47/78  soft-tissue]
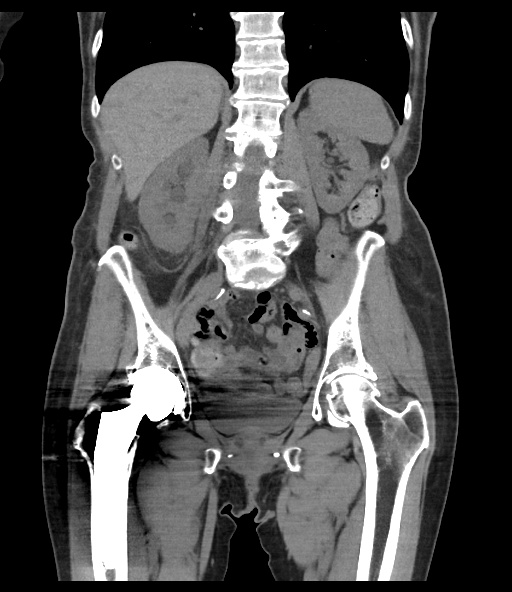

[Series 7: axial rt hip bone · axial · 0.32mm/px · z∈[-470,-402]mm · 3 of 146 slices shown]
[im 18/146  bone]
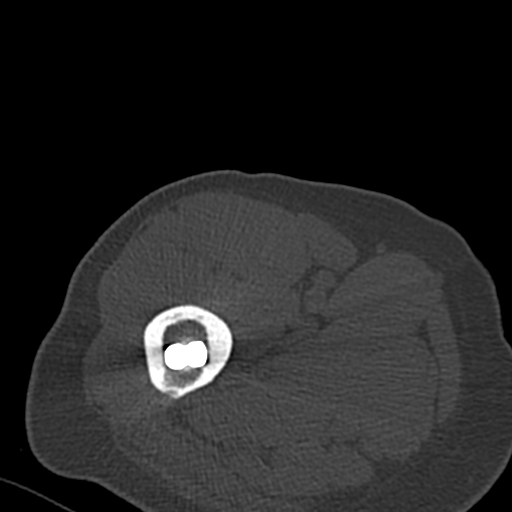
[im 35/146  bone]
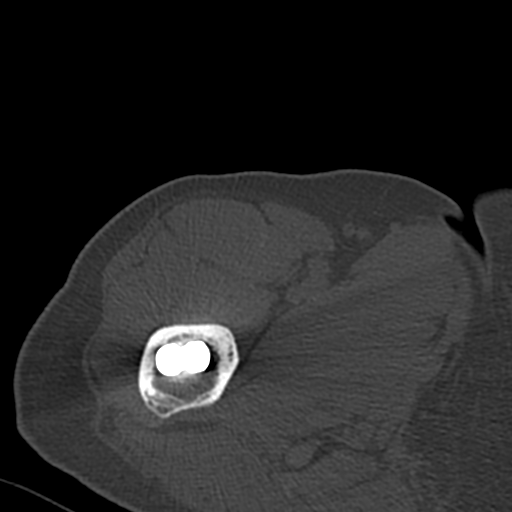
[im 52/146  bone]
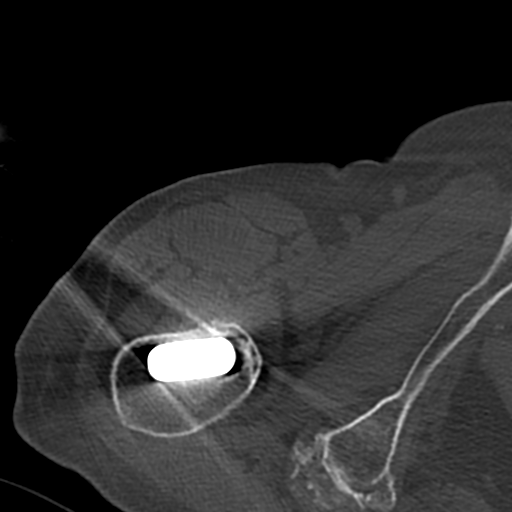

[15 of 46 positions shown; findings below may reference images not displayed]

FINDINGS: CT abdomen/pelvis:

Lower chest: Subsegmental atelectasis at the right lung base.

Hepatobiliary: No focal hepatic abnormality on noncontrast exam.
Gallbladder physiologically distended, no calcified stone. No
biliary dilatation.

Pancreas: No ductal dilatation or inflammation.

Spleen: Normal in size without focal abnormality.

Adrenals/Urinary Tract: No adrenal nodule. Moderate right
hydronephrosis. Proximal right hydroureter. 5 mm calcification the
level of L4-L5 is likely a vertebral calculus and cause for
obstruction. The more distal ureter is not well-defined and is
likely decompressed. Distal most ureters obscured by streak artifact
from right hip arthroplasty. There are 3 nonobstructing stones in
the left kidney. No left hydronephrosis. Urinary bladder is
partially distended. Streak artifact from right hip arthroplasty
partially obscures bladder evaluation. No definite bladder wall
thickening or stone.

Stomach/Bowel: Bowel evaluation is limited in the absence of
contrast and paucity of intra-abdominal fat. The stomach is
nondistended. No evidence of small bowel obstruction or
inflammation. Normal appendix. Moderate to large volume of colonic
stool. No colonic wall thickening or inflammation. Distal colon is
decompressed.

Vascular/Lymphatic: Aorto bi-iliac atherosclerosis. Limited
assessment for adenopathy in the absence of contrast and paucity of
intra-abdominal fat. No bulky abdominopelvic lymph nodes.

Reproductive: Uterus displaced posteriorly, grossly normal. No
evidence of adnexal mass.

Other: No free air or ascites. No body wall hernia.

Musculoskeletal: Multilevel degenerative and postsurgical change in
the lumbar spine. Moderate left hip osteoarthritis.

Right hip:

Bone/joint: Right hip arthroplasty. No periprosthetic fracture or
lucency. No evidence of loosening. Pubic rami are intact.

Ligaments: Not well assessed on CT.

Muscles/tendons: Enthesopathic changes in the ischial tuberosity. No
intramuscular hematoma or focal abnormality.
IMPRESSION: Abdomen/pelvis:

1. Moderate right hydronephrosis and proximal hydroureter. 5 mm
calcification to the right of L3-L4 is likely a ureteral calculus
and cause for obstruction.
2. Nonobstructing left nephrolithiasis.
3. Moderate to large volume of colonic stool, can be seen with
constipation.

Right hip:

Intact arthroplasty without acute or periprosthetic fracture.

Aortic Atherosclerosis ([id number]).

## 2021-10-10 ENCOUNTER — Ambulatory Visit: Payer: Medicare HMO

## 2021-11-07 ENCOUNTER — Other Ambulatory Visit: Payer: Self-pay

## 2021-11-07 ENCOUNTER — Ambulatory Visit
Admission: RE | Admit: 2021-11-07 | Discharge: 2021-11-07 | Disposition: A | Discharge status: Home / Self Care | Payer: Medicare HMO | Source: Ambulatory Visit | Attending: Family Medicine | Admitting: Family Medicine

## 2021-11-07 DIAGNOSIS — Z1231 Encounter for screening mammogram for malignant neoplasm of breast: Secondary | ICD-10-CM

## 2021-12-26 ENCOUNTER — Other Ambulatory Visit: Payer: Self-pay

## 2022-02-19 ENCOUNTER — Ambulatory Visit: Payer: Medicare HMO | Admitting: Podiatry

## 2022-05-18 ENCOUNTER — Ambulatory Visit
Admission: RE | Admit: 2022-05-18 | Discharge: 2022-05-18 | Disposition: A | Discharge status: Home / Self Care | Payer: Medicare HMO | Source: Ambulatory Visit | Attending: Nurse Practitioner | Admitting: Nurse Practitioner

## 2022-05-18 DIAGNOSIS — M8580 Other specified disorders of bone density and structure, unspecified site: Secondary | ICD-10-CM

## 2022-06-20 ENCOUNTER — Ambulatory Visit (INDEPENDENT_AMBULATORY_CARE_PROVIDER_SITE_OTHER): Payer: Medicare HMO

## 2022-06-20 ENCOUNTER — Ambulatory Visit (INDEPENDENT_AMBULATORY_CARE_PROVIDER_SITE_OTHER): Payer: Medicare HMO | Admitting: Orthopaedic Surgery

## 2022-06-20 ENCOUNTER — Encounter: Payer: Self-pay | Admitting: Orthopaedic Surgery

## 2022-06-20 VITALS — Ht 59.5 in | Wt 103.2 lb

## 2022-06-20 DIAGNOSIS — M1612 Unilateral primary osteoarthritis, left hip: Secondary | ICD-10-CM | POA: Diagnosis not present

## 2022-06-20 DIAGNOSIS — G8929 Other chronic pain: Secondary | ICD-10-CM | POA: Diagnosis not present

## 2022-06-20 DIAGNOSIS — M25562 Pain in left knee: Secondary | ICD-10-CM

## 2022-06-20 NOTE — Progress Notes (Signed)
Office Visit Note   Patient: [name]           Date of Birth: Apr 19, 1956           MRN: [id number] Visit Date: 06/20/2022              Requested by: Beverley Fiedler, [place],  [place] [id number] PCP: Beverley Fiedler, FNP   Assessment & Plan: Visit Diagnoses:  1. Chronic pain of left knee   2. Unilateral primary osteoarthritis, left hip     Plan: I agree with my other colleagues in town that she is a candidate for a left total hip arthroplasty.  Having had this before she is fully aware of the risk and benefits of surgery.  I still gave her handout about hip replacement surgery and went over her x-rays.  I described the risk and benefits of surgery.  I do feel this did not help her posture and her back and can calm down her knee pain.  All questions and concerns were answered and addressed.  She is only comfortable to having the surgery at [facility].  We will work on getting this scheduled.    Follow-Up Instructions: Return for 2 weeks post-op.   Orders:  Orders Placed This Encounter  Procedures   XR Knee 1-2 Views Left   No orders of the defined types were placed in this encounter.     Procedures: No procedures performed   Clinical Data: No additional findings.   Subjective: Chief Complaint  Patient presents with   Left Hip - Pain   Left Knee - Pain  The patient is someone who is coming to see me for a left hip replacement.  She is actually been seen by colleagues of mine in town.  She had a right total hip arthroplasty in 2015 to an anterior approach.  She then saw another group and has been told she needed a hip replacement on the left side due to severe end-stage arthritis.  She does have daily hip pain.  She reports that her left side is shorter than her right side.  She has had spine surgery as well.  She had arthroscopic surgery on her knees in the past.  At this point her left hip pain is definitely affecting her  mobility, her quality of life and actives daily living.  I believe the leg length discrepancy is also causing back pain.  She is not a diabetic.  She takes minimal pain medications.  She does use marijuana for relaxation and pain control at times.  HPI  Review of Systems There is currently listed no fever, chills, nausea, vomiting  Objective: Vital Signs: Ht 4' 11.5" (1.511 m)   Wt 103 lb 3.2 oz (46.8 kg)   BMI 20.50 kg/m   Physical Exam She is alert and oriented x3 and in no acute distress Ortho Exam On examination she is very mobile.  Her left hip is significantly stiff with attempted rotation and does show significant pain in the groin with rotation.  Both knees have pain throughout the arc of motion but no effusion.  Her right hip moves smoothly.  When I have her lay in the supine position she is significantly short on the left side comparing her left and right sides. Specialty Comments:  No specialty comments available.  Imaging: XR Knee 1-2 Views Left  Result Date: 06/20/2022 2 views of the left knee show no acute findings.  There is  narrowing of the medial lateral compartment and patellofemoral joint showing moderate arthritis.   X-rays of her pelvis and left hip that accompany her show well-seated total hip arthroplasty on the right side.  She has severe end-stage arthritis of left hip with sclerotic change in the femoral head and acetabulum as well as joint space narrowing and irregular surface of the femoral head.   PMFS History: Patient Active Problem List   Diagnosis Date Noted   Unilateral primary osteoarthritis, left hip 06/20/2022   Radiculopathy 07/19/2021   GAD (generalized anxiety disorder) 12/07/2020   Suicidal ideation    Closed 3-part fracture of proximal humerus 09/07/2019   Proximal humerus fracture    Vitamin B12 deficiency 03/26/2018   Acute blood loss anemia 10/25/2016   Primary osteoarthritis of right hip 10/08/2016   Borderline personality disorder  ([place]) 10/08/2016   Migraines 10/08/2016   Chronic back pain 10/08/2016   Osteoarthritis of hips, bilateral 10/08/2016   UTI (urinary tract infection) 07/19/2014   Drug overdose 07/13/2014   Acute respiratory failure with hypoxia ([place]) 07/13/2014   Hypokalemia 07/13/2014   Hypotension, unspecified 07/13/2014   Suicidal ideations 07/13/2014   Past Medical History:  Diagnosis Date   ADHD    Anxiety    Arthritis    Carpal tunnel syndrome of left wrist    Complication of anesthesia    states she had to much and once adn ended up on o2, no problems since   Contracture of muscle of right upper arm    limited rom   Cubital tunnel syndrome on left    Depression    Dyspnea    with activity   GERD (gastroesophageal reflux disease)    History of kidney stones    History of suicide attempt 2012--  XANAX OVERDOSE   Hypotension    reports that her SBP can run < 90   Migraine    Personality disorder (HCC)    Proximal humerus fracture    right   UTI (urinary tract infection)    on cipro   Vitamin B12 deficiency 03/26/2018    Family History  Problem Relation Age of Onset   Breast cancer Mother     Past Surgical History:  Procedure Laterality Date   ANTERIOR CERVICAL DECOMPRESSION/DISCECTOMY FUSION 4 LEVELS N/A 07/19/2021   Procedure: ANTERIOR CERVICAL DECOMPRESSION FUSION CERVICAL 4- CERVICAL 5, CERVICAL 5- CERVICAL 6, CERVICAL 6- CERVICAL 7 WITH INSTRUMENTATION AND ALLOGRAFT;  Surgeon: Phylliss Bob, MD;  Location: [facility];  Service: Orthopedics;  Laterality: N/A;   BACK SURGERY     CARPAL TUNNEL RELEASE Left 05/13/2013   Procedure: CARPAL TUNNEL RELEASE ENDOSCOPIC;  Surgeon: Jolyn Nap, MD;  Location: [facility];  Service: Orthopedics;  Laterality: Left;  Incision at 0955.    CERVICAL BIOPSY  W/ LOOP ELECTRODE EXCISION  12/01/2008   CIN II   EYE SURGERY     FEMORAL HERNIA REPAIR Left 10/31/2009   AND INGUINAL LYMPH NODE BX   LUMBAR [place] SURGERY  X3   LAST ONE  1989   FUSION   NERVE REPAIR Left 05/13/2013   Procedure: ULNAR NEUROPLASTY AT ELBOW;  Surgeon: Jolyn Nap, MD;  Location: [facility];  Service: Orthopedics;  Laterality: Left;   ORIF HUMERUS FRACTURE Right 09/18/2019   Procedure: OPEN REDUCTION INTERNAL FIXATION (ORIF) HUMERAL FRACTURE;  Surgeon: Renette Butters, MD;  Location: [place];  Service: Orthopedics;  Laterality: Right;   [place]  TONSILLECTOMY     TOTAL HIP ARTHROPLASTY Right 10/23/2016   TOTAL HIP ARTHROPLASTY Right 10/23/2016   Procedure: TOTAL HIP ARTHROPLASTY ANTERIOR APPROACH;  Surgeon: Renette Butters, MD;  Location: [place];  Service: Orthopedics;  Laterality: Right;   Social History   Occupational History   Not on file  Tobacco Use   Smoking status: Former   Smokeless tobacco: Never   Tobacco comments:    quit smoking 25 years ago  Vaping Use   Vaping Use: Never used  Substance and Sexual Activity   Alcohol use: No   Drug use: Yes    Types: Marijuana    Comment: > 1 mo ago   Sexual activity: Not on file

## 2022-06-25 ENCOUNTER — Telehealth: Payer: Self-pay | Admitting: Orthopaedic Surgery

## 2022-06-25 NOTE — Telephone Encounter (Signed)
Please advise 

## 2022-06-25 NOTE — Telephone Encounter (Signed)
Called pt and informed she can get one for the braces otc from [facility] or come in for a nurse visit on Wednesday to look at our brace. She was instructed to cb Wednesday to set up this visit if she cant find another otc brace she likes.

## 2022-06-25 NOTE — Telephone Encounter (Signed)
Pt called requesting a left knee brace. Pt states her knee is hurting and think it will help. Please send to [facility]. Please call pt [phone].

## 2022-07-10 ENCOUNTER — Other Ambulatory Visit: Payer: Self-pay

## 2022-08-07 ENCOUNTER — Other Ambulatory Visit: Payer: Self-pay | Admitting: Physician Assistant

## 2022-08-07 DIAGNOSIS — M1612 Unilateral primary osteoarthritis, left hip: Secondary | ICD-10-CM

## 2022-08-16 NOTE — Pre-Procedure Instructions (Signed)
Surgical Instructions    Your procedure is scheduled on Tuesday August 29.  Report to [facility] Main Entrance "A" at 7:30 A.M., then check in with the Admitting office.  Call this number if you have problems the morning of surgery:  [phone]   If you have any questions prior to your surgery date call [phone]: Open Monday-Friday 8am-4pm    Remember:  Do not eat after midnight the night before your surgery  You may drink clear liquids until 6:30 the morning of your surgery.   Clear liquids allowed are: Water, Non-Citrus Juices (without pulp), Carbonated Beverages, Clear Tea, Black Coffee ONLY (NO MILK, CREAM OR POWDERED CREAMER of any kind), and Gatorade    Enhanced Recovery after Surgery for Orthopedics Enhanced Recovery after Surgery is a protocol used to improve the stress on your body and your recovery after surgery.  Patient Instructions  The day of surgery (if you do NOT have diabetes):  Drink ONE (1) Pre-Surgery Clear Ensure by 6:30 am the morning of surgery   This drink was given to you during your hospital  pre-op appointment visit. Nothing else to drink after completing the  Pre-Surgery Clear Ensure.         If you have questions, please contact your surgeon's office.    Take these medicines the morning of surgery with A SIP OF WATER:   gabapentin (NEURONTIN) topiramate (TOPAMAX)  If Needed: acetaminophen (TYLENOL) OR HYDROcodone-acetaminophen (NORCO/VICODIN) promethazine (PHENERGAN) SUMAtriptan (IMITREX) tiZANidine (ZANAFLEX)    As of today, STOP taking any Aspirin (unless otherwise instructed by your surgeon) Aleve, Naproxen, Ibuprofen, Motrin, Advil, Goody's, BC's, all herbal medications, fish oil, and all vitamins.           Do not wear jewelry or makeup. Do not wear lotions, powders, perfumes or deodorant. Do not shave 48 hours prior to surgery. Do not bring valuables to the hospital. Do not wear nail polish, gel polish, artificial nails, or  any other type of covering on natural nails (fingers and toes) If you have artificial nails or gel coating that need to be removed by a nail salon, please have this removed prior to surgery. Artificial nails or gel coating may interfere with anesthesia's ability to adequately monitor your vital signs.  [facility] is not responsible for any belongings or valuables.    Do NOT Smoke (Tobacco/Vaping)  24 hours prior to your procedure  If you use a CPAP at night, you may bring your mask for your overnight stay.   Contacts, glasses, hearing aids, dentures or partials may not be worn into surgery, please bring cases for these belongings   For patients admitted to the hospital, discharge time will be determined by your treatment team.   Patients discharged the day of surgery will not be allowed to drive home, and someone needs to stay with them for 24 hours.   SURGICAL WAITING ROOM VISITATION Patients having surgery or a procedure may have no more than 2 support people in the waiting area - these visitors may rotate.   Children under the age of 27 must have an adult with them who is not the patient. If the patient needs to stay at the hospital during part of their recovery, the visitor guidelines for inpatient rooms apply. Pre-op nurse will coordinate an appropriate time for 1 support person to accompany patient in pre-op.  This support person may not rotate.   Please refer to the [facility] website for the visitor guidelines for Inpatients (after your surgery  is over and you are in a regular room).    Special instructions:    Oral Hygiene is also important to reduce your risk of infection.  Remember - BRUSH YOUR TEETH THE MORNING OF SURGERY WITH YOUR REGULAR TOOTHPASTE   [facility]- Preparing For Surgery  Before surgery, you can play an important role. Because skin is not sterile, your skin needs to be as free of germs as possible. You can reduce the number of germs on your skin by washing  with CHG (chlorahexidine gluconate) Soap before surgery.  CHG is an antiseptic cleaner which kills germs and bonds with the skin to continue killing germs even after washing.     Please do not use if you have an allergy to CHG or antibacterial soaps. If your skin becomes reddened/irritated stop using the CHG.  Do not shave (including legs and underarms) for at least 48 hours prior to first CHG shower. It is OK to shave your face.  Please follow these instructions carefully.     Shower the NIGHT BEFORE SURGERY and the MORNING OF SURGERY with CHG Soap.   If you chose to wash your hair, wash your hair first as usual with your normal shampoo. After you shampoo, rinse your hair and body thoroughly to remove the shampoo.  Then [employer] and genitals (private parts) with your normal soap and rinse thoroughly to remove soap.  After that Use CHG Soap as you would any other liquid soap. You can apply CHG directly to the skin and wash gently with a scrungie or a clean washcloth.   Apply the CHG Soap to your body ONLY FROM THE NECK DOWN.  Do not use on open wounds or open sores. Avoid contact with your eyes, ears, mouth and genitals (private parts). Wash Face and genitals (private parts)  with your normal soap.   Wash thoroughly, paying special attention to the area where your surgery will be performed.  Thoroughly rinse your body with warm water from the neck down.  DO NOT shower/wash with your normal soap after using and rinsing off the CHG Soap.  Pat yourself dry with a CLEAN TOWEL.  Wear CLEAN PAJAMAS to bed the night before surgery  Place CLEAN SHEETS on your bed the night before your surgery  DO NOT SLEEP WITH PETS.   Day of Surgery:  Take a shower with CHG soap. Wear Clean/Comfortable clothing the morning of surgery Do not apply any deodorants/lotions.   Remember to brush your teeth WITH YOUR REGULAR TOOTHPASTE.    If you received a COVID test during your pre-op visit, it is  requested that you wear a mask when out in public, stay away from anyone that may not be feeling well, and notify your surgeon if you develop symptoms. If you have been in contact with anyone that has tested positive in the last 10 days, please notify your surgeon.    Please read over the following fact sheets that you were given.

## 2022-08-17 ENCOUNTER — Telehealth: Payer: Self-pay

## 2022-08-17 ENCOUNTER — Encounter (HOSPITAL_COMMUNITY)
Admission: RE | Admit: 2022-08-17 | Discharge: 2022-08-17 | Disposition: A | Discharge status: Home / Self Care | Payer: Medicare HMO | Source: Ambulatory Visit | Attending: Orthopaedic Surgery | Admitting: Orthopaedic Surgery

## 2022-08-17 ENCOUNTER — Other Ambulatory Visit: Payer: Self-pay

## 2022-08-17 ENCOUNTER — Encounter (HOSPITAL_COMMUNITY): Payer: Self-pay

## 2022-08-17 VITALS — BP 124/83 | HR 69 | Temp 98.2°F | Resp 17 | Ht 60.0 in | Wt 105.0 lb

## 2022-08-17 DIAGNOSIS — Z01812 Encounter for preprocedural laboratory examination: Secondary | ICD-10-CM | POA: Insufficient documentation

## 2022-08-17 DIAGNOSIS — N289 Disorder of kidney and ureter, unspecified: Secondary | ICD-10-CM

## 2022-08-17 DIAGNOSIS — Z01818 Encounter for other preprocedural examination: Secondary | ICD-10-CM

## 2022-08-17 HISTORY — DX: Unspecified mononeuropathy of bilateral lower limbs: G57.93

## 2022-08-17 LAB — CBC
HCT: 40 % (ref 36.0–46.0)
Hemoglobin: 12.7 g/dL (ref 12.0–15.0)
MCH: 33.4 pg (ref 26.0–34.0)
MCHC: 31.8 g/dL (ref 30.0–36.0)
MCV: 105.3 fL — ABNORMAL HIGH (ref 80.0–100.0)
Platelets: 157 10*3/uL (ref 150–400)
RBC: 3.8 MIL/uL — ABNORMAL LOW (ref 3.87–5.11)
RDW: 13.6 % (ref 11.5–15.5)
WBC: 6.6 10*3/uL (ref 4.0–10.5)
nRBC: 0 % (ref 0.0–0.2)

## 2022-08-17 LAB — COMPREHENSIVE METABOLIC PANEL
ALT: 15 U/L (ref 0–44)
AST: 21 U/L (ref 15–41)
Albumin: 4.1 g/dL (ref 3.5–5.0)
Alkaline Phosphatase: 66 U/L (ref 38–126)
Anion gap: 7 (ref 5–15)
BUN: 19 mg/dL (ref 8–23)
CO2: 22 mmol/L (ref 22–32)
Calcium: 9.6 mg/dL (ref 8.9–10.3)
Chloride: 113 mmol/L — ABNORMAL HIGH (ref 98–111)
Creatinine, Ser: 0.92 mg/dL (ref 0.44–1.00)
GFR, Estimated: >60 mL/min (ref >60)
Glucose, Bld: 94 mg/dL (ref 70–99)
Potassium: 3.9 mmol/L (ref 3.5–5.1)
Sodium: 142 mmol/L (ref 135–145)
Total Bilirubin: 0.4 mg/dL (ref 0.3–1.2)
Total Protein: 6.6 g/dL (ref 6.5–8.1)

## 2022-08-17 LAB — SURGICAL PCR SCREEN
MRSA, PCR: NEGATIVE
Staphylococcus aureus: NEGATIVE

## 2022-08-17 NOTE — Telephone Encounter (Signed)
[facility] [phone] called and wants to know how many units of blood the pt will need day of surgery + antibodies. Call to advise.

## 2022-08-17 NOTE — Telephone Encounter (Signed)
Blood bank aware of below message

## 2022-08-17 NOTE — Progress Notes (Addendum)
PCP - Beverley Fiedler, [place] Cardiologist - Denies  PPM/ICD - Denies Device Orders - n/a Rep Notified - n/a  Chest x-ray - 2015 EKG - 08/24/2020 Stress Test - denies ECHO - denies Cardiac Cath - denies  Sleep Study - Denies CPAP - n/a  No DM  Blood Thinner Instructions: n/a Aspirin Instructions: n/a  ERAS Protcol - Yes. Clear Liquids until 0630 morning of surgery PRE-SURGERY Ensure or G2- Ensure given to patient at PAT appointment  COVID TEST- n/a   Anesthesia review: No  Patient denies shortness of breath, fever, cough and chest pain at PAT appointment   All instructions explained to the patient, with a verbal understanding of the material. Patient agrees to go over the instructions while at home for a better understanding. Patient also instructed to self quarantine after being tested for COVID-19. The opportunity to ask questions was provided.

## 2022-08-20 NOTE — Anesthesia Preprocedure Evaluation (Addendum)
Anesthesia Evaluation  Patient identified by MRN, date of birth, ID band Patient awake    Reviewed: Allergy & Precautions, NPO status , Patient's Chart, lab work & pertinent test results  History of Anesthesia Complications (+) history of anesthetic complications  Airway Mallampati: I  TM Distance: >3 FB Neck ROM: Limited    Dental  (+) Teeth Intact, Dental Advisory Given, Caps   Pulmonary neg pulmonary ROS, shortness of breath and with exertion, Patient abstained from smoking., former smoker,    Pulmonary exam normal        Cardiovascular negative cardio ROS   Rhythm:Regular Rate:Normal     Neuro/Psych  Headaches, PSYCHIATRIC DISORDERS Anxiety Depression Extensive cervical fusion  Neuromuscular disease negative neurological ROS  negative psych ROS   GI/Hepatic negative GI ROS, Neg liver ROS, GERD  Medicated,  Endo/Other  negative endocrine ROS  Renal/GU negative Renal ROS  negative genitourinary   Musculoskeletal negative musculoskeletal ROS (+) Arthritis , Osteoarthritis,  Cervical radiculopathy   Abdominal (+)  Abdomen: soft. Bowel sounds: normal.  Peds negative pediatric ROS (+) ADHD Hematology negative hematology ROS (+) Blood dyscrasia, anemia ,   Anesthesia Other Findings   Reproductive/Obstetrics negative OB ROS                           Anesthesia Physical  Anesthesia Plan  ASA: 3  Anesthesia Plan: General   Post-op Pain Management: Ofirmev IV (intra-op)* and Toradol IV (intra-op)*   Induction: Intravenous  PONV Risk Score and Plan: 3 and Ondansetron, Dexamethasone and Treatment may vary due to age or medical condition  Airway Management Planned: LMA and Oral ETT  Additional Equipment: None  Intra-op Plan:   Post-operative Plan: Extubation in OR  Informed Consent: I have reviewed the patients History and Physical, chart, labs and discussed the procedure  including the risks, benefits and alternatives for the proposed anesthesia with the patient or authorized representative who has indicated his/her understanding and acceptance.   Patient has DNR.  Discussed DNR with patient and Continue DNR.   Dental advisory given  Plan Discussed with: CRNA and Anesthesiologist  Anesthesia Plan Comments: (  )      Anesthesia Quick Evaluation

## 2022-08-20 NOTE — Progress Notes (Signed)
Patient voiced understanding of new arrival time of 0530. Clear liquids/ ERAS drink until 0430

## 2022-08-21 ENCOUNTER — Encounter (HOSPITAL_COMMUNITY): Payer: Self-pay | Admitting: Orthopaedic Surgery

## 2022-08-21 ENCOUNTER — Observation Stay (HOSPITAL_COMMUNITY): Payer: Medicare HMO

## 2022-08-21 ENCOUNTER — Ambulatory Visit (HOSPITAL_COMMUNITY): Payer: Medicare HMO | Admitting: Certified Registered Nurse Anesthetist

## 2022-08-21 ENCOUNTER — Encounter (HOSPITAL_COMMUNITY)
Admission: RE | Disposition: A | Discharge status: Home Health | Payer: Self-pay | Source: Home / Self Care | Attending: Orthopaedic Surgery

## 2022-08-21 ENCOUNTER — Ambulatory Visit (HOSPITAL_COMMUNITY): Payer: Medicare HMO

## 2022-08-21 ENCOUNTER — Other Ambulatory Visit: Payer: Self-pay

## 2022-08-21 ENCOUNTER — Ambulatory Visit (HOSPITAL_BASED_OUTPATIENT_CLINIC_OR_DEPARTMENT_OTHER): Payer: Medicare HMO | Admitting: Certified Registered Nurse Anesthetist

## 2022-08-21 ENCOUNTER — Observation Stay (HOSPITAL_COMMUNITY)
Admission: RE | Admit: 2022-08-21 | Discharge: 2022-08-23 | Disposition: A | Discharge status: Home Health | Payer: Medicare HMO | Attending: Orthopaedic Surgery | Admitting: Orthopaedic Surgery

## 2022-08-21 DIAGNOSIS — F418 Other specified anxiety disorders: Secondary | ICD-10-CM

## 2022-08-21 DIAGNOSIS — F909 Attention-deficit hyperactivity disorder, unspecified type: Secondary | ICD-10-CM

## 2022-08-21 DIAGNOSIS — Z96642 Presence of left artificial hip joint: Secondary | ICD-10-CM

## 2022-08-21 DIAGNOSIS — D649 Anemia, unspecified: Secondary | ICD-10-CM | POA: Diagnosis not present

## 2022-08-21 DIAGNOSIS — Z96641 Presence of right artificial hip joint: Secondary | ICD-10-CM | POA: Insufficient documentation

## 2022-08-21 DIAGNOSIS — Z87891 Personal history of nicotine dependence: Secondary | ICD-10-CM | POA: Diagnosis not present

## 2022-08-21 DIAGNOSIS — M1612 Unilateral primary osteoarthritis, left hip: Secondary | ICD-10-CM

## 2022-08-21 HISTORY — PX: TOTAL HIP ARTHROPLASTY: SHX124

## 2022-08-21 LAB — TYPE AND SCREEN
ABO/RH(D): A POS
Antibody Screen: POSITIVE

## 2022-08-21 SURGERY — ARTHROPLASTY, HIP, TOTAL, ANTERIOR APPROACH
Anesthesia: General | Site: Hip | Laterality: Left

## 2022-08-21 MED ORDER — 0.9 % SODIUM CHLORIDE (POUR BTL) OPTIME
TOPICAL | Status: DC | PRN
  Administered 2022-08-21: 1000 mL

## 2022-08-21 MED ORDER — ACETAMINOPHEN 10 MG/ML IV SOLN
INTRAVENOUS | Status: DC | PRN
  Administered 2022-08-21: 750 mg via INTRAVENOUS

## 2022-08-21 MED ORDER — FENTANYL CITRATE (PF) 100 MCG/2ML IJ SOLN
25.0000 ug | INTRAMUSCULAR | Status: DC | PRN
  Administered 2022-08-21: 25 ug via INTRAVENOUS
  Administered 2022-08-21: 50 ug via INTRAVENOUS
  Administered 2022-08-21: 25 ug via INTRAVENOUS

## 2022-08-21 MED ORDER — STERILE WATER FOR IRRIGATION IR SOLN
Status: DC | PRN
  Administered 2022-08-21 (×2): 1000 mL

## 2022-08-21 MED ORDER — DIPHENHYDRAMINE HCL 50 MG/ML IJ SOLN
INTRAMUSCULAR | Status: DC | PRN
  Administered 2022-08-21: 25 mg via INTRAVENOUS

## 2022-08-21 MED ORDER — DIPHENHYDRAMINE HCL 50 MG/ML IJ SOLN
INTRAMUSCULAR | Status: AC
  Filled 2022-08-21: qty 1

## 2022-08-21 MED ORDER — LACTATED RINGERS IV SOLN: INTRAVENOUS | Status: DC

## 2022-08-21 MED ORDER — METHOCARBAMOL 500 MG PO TABS
500.0000 mg | ORAL_TABLET | Freq: Four times a day (QID) | ORAL | Status: DC | PRN
  Administered 2022-08-22: 500 mg via ORAL
  Filled 2022-08-21: qty 1

## 2022-08-21 MED ORDER — MEPERIDINE HCL 25 MG/ML IJ SOLN: 6.2500 mg | INTRAMUSCULAR | Status: DC | PRN

## 2022-08-21 MED ORDER — HYDROMORPHONE HCL 1 MG/ML IJ SOLN: 0.5000 mg | INTRAMUSCULAR | Status: DC | PRN

## 2022-08-21 MED ORDER — ACETAMINOPHEN 325 MG PO TABS: 325.0000 mg | ORAL_TABLET | ORAL | Status: DC | PRN

## 2022-08-21 MED ORDER — ONDANSETRON HCL 4 MG/2ML IJ SOLN
INTRAMUSCULAR | Status: AC
  Filled 2022-08-21: qty 2

## 2022-08-21 MED ORDER — METOCLOPRAMIDE HCL 5 MG PO TABS: 5.0000 mg | ORAL_TABLET | Freq: Three times a day (TID) | ORAL | Status: DC | PRN

## 2022-08-21 MED ORDER — KETAMINE HCL 10 MG/ML IJ SOLN
INTRAMUSCULAR | Status: DC | PRN
  Administered 2022-08-21: 20 mg via INTRAVENOUS

## 2022-08-21 MED ORDER — DOCUSATE SODIUM 100 MG PO CAPS
100.0000 mg | ORAL_CAPSULE | Freq: Two times a day (BID) | ORAL | Status: DC
  Administered 2022-08-21 – 2022-08-23 (×5): 100 mg via ORAL
  Filled 2022-08-21 (×5): qty 1

## 2022-08-21 MED ORDER — PROPOFOL 10 MG/ML IV BOLUS
INTRAVENOUS | Status: DC | PRN
  Administered 2022-08-21: 150 mg via INTRAVENOUS

## 2022-08-21 MED ORDER — PHENYLEPHRINE HCL (PRESSORS) 10 MG/ML IV SOLN
INTRAVENOUS | Status: DC | PRN
  Administered 2022-08-21 (×2): 80 ug via INTRAVENOUS

## 2022-08-21 MED ORDER — OXYCODONE HCL 5 MG PO TABS
ORAL_TABLET | ORAL | Status: AC
  Administered 2022-08-21: 5 mg
  Filled 2022-08-21: qty 1

## 2022-08-21 MED ORDER — ASPIRIN 81 MG PO CHEW
81.0000 mg | CHEWABLE_TABLET | Freq: Two times a day (BID) | ORAL | Status: DC
  Administered 2022-08-21 – 2022-08-23 (×4): 81 mg via ORAL
  Filled 2022-08-21 (×4): qty 1

## 2022-08-21 MED ORDER — PROPOFOL 10 MG/ML IV BOLUS
INTRAVENOUS | Status: AC
  Filled 2022-08-21: qty 20

## 2022-08-21 MED ORDER — ENSURE ENLIVE PO LIQD
237.0000 mL | Freq: Two times a day (BID) | ORAL | Status: DC
Start: 2022-08-21 — End: 2022-08-23
  Administered 2022-08-21 – 2022-08-23 (×4): 237 mL via ORAL

## 2022-08-21 MED ORDER — ONDANSETRON HCL 4 MG/2ML IJ SOLN: 4.0000 mg | Freq: Four times a day (QID) | INTRAMUSCULAR | Status: DC | PRN

## 2022-08-21 MED ORDER — ALUM & MAG HYDROXIDE-SIMETH 200-200-20 MG/5ML PO SUSP: 30.0000 mL | ORAL | Status: DC | PRN

## 2022-08-21 MED ORDER — ZOLPIDEM TARTRATE 5 MG PO TABS: 5.0000 mg | ORAL_TABLET | Freq: Every evening | ORAL | Status: DC | PRN

## 2022-08-21 MED ORDER — TOPIRAMATE 100 MG PO TABS
200.0000 mg | ORAL_TABLET | Freq: Every day | ORAL | Status: DC
  Administered 2022-08-22 – 2022-08-23 (×2): 200 mg via ORAL
  Filled 2022-08-21 (×2): qty 2

## 2022-08-21 MED ORDER — METHOCARBAMOL 1000 MG/10ML IJ SOLN
500.0000 mg | Freq: Four times a day (QID) | INTRAVENOUS | Status: DC | PRN
  Filled 2022-08-21: qty 5

## 2022-08-21 MED ORDER — LIDOCAINE 2% (20 MG/ML) 5 ML SYRINGE
INTRAMUSCULAR | Status: AC
Start: 2022-08-21 — End: ?
  Filled 2022-08-21: qty 5

## 2022-08-21 MED ORDER — DEXAMETHASONE SODIUM PHOSPHATE 10 MG/ML IJ SOLN
INTRAMUSCULAR | Status: AC
  Filled 2022-08-21: qty 1

## 2022-08-21 MED ORDER — METOCLOPRAMIDE HCL 5 MG/ML IJ SOLN: 5.0000 mg | Freq: Three times a day (TID) | INTRAMUSCULAR | Status: DC | PRN

## 2022-08-21 MED ORDER — OXYCODONE HCL 5 MG PO TABS
5.0000 mg | ORAL_TABLET | ORAL | Status: DC | PRN
  Administered 2022-08-22: 10 mg via ORAL
  Administered 2022-08-22: 5 mg via ORAL
  Filled 2022-08-21: qty 2
  Filled 2022-08-21 (×2): qty 1

## 2022-08-21 MED ORDER — KETAMINE HCL 50 MG/5ML IJ SOSY
PREFILLED_SYRINGE | INTRAMUSCULAR | Status: AC
  Filled 2022-08-21: qty 5

## 2022-08-21 MED ORDER — VALACYCLOVIR HCL 500 MG PO TABS
500.0000 mg | ORAL_TABLET | Freq: Every day | ORAL | Status: DC
Start: 2022-08-21 — End: 2022-08-23
  Administered 2022-08-21 – 2022-08-22 (×2): 500 mg via ORAL
  Filled 2022-08-21 (×3): qty 1

## 2022-08-21 MED ORDER — ONDANSETRON HCL 4 MG/2ML IJ SOLN: 4.0000 mg | Freq: Once | INTRAMUSCULAR | Status: DC | PRN

## 2022-08-21 MED ORDER — FENTANYL CITRATE (PF) 100 MCG/2ML IJ SOLN
INTRAMUSCULAR | Status: AC
  Filled 2022-08-21: qty 2

## 2022-08-21 MED ORDER — FENTANYL CITRATE (PF) 250 MCG/5ML IJ SOLN
INTRAMUSCULAR | Status: AC
  Filled 2022-08-21: qty 5

## 2022-08-21 MED ORDER — ACETAMINOPHEN 325 MG PO TABS
325.0000 mg | ORAL_TABLET | Freq: Four times a day (QID) | ORAL | Status: DC | PRN
  Administered 2022-08-21: 650 mg via ORAL
  Filled 2022-08-21: qty 2

## 2022-08-21 MED ORDER — ACETAMINOPHEN 10 MG/ML IV SOLN
INTRAVENOUS | Status: AC
  Filled 2022-08-21: qty 100

## 2022-08-21 MED ORDER — OXYCODONE HCL 5 MG PO TABS
10.0000 mg | ORAL_TABLET | ORAL | Status: DC | PRN
  Administered 2022-08-22: 15 mg via ORAL
  Administered 2022-08-22 – 2022-08-23 (×2): 10 mg via ORAL
  Administered 2022-08-23: 15 mg via ORAL
  Filled 2022-08-21: qty 2
  Filled 2022-08-21 (×3): qty 3

## 2022-08-21 MED ORDER — AMISULPRIDE (ANTIEMETIC) 5 MG/2ML IV SOLN
10.0000 mg | Freq: Once | INTRAVENOUS | Status: AC
Start: 2022-08-21 — End: 2022-08-21
  Administered 2022-08-21: 10 mg via INTRAVENOUS

## 2022-08-21 MED ORDER — MENTHOL 3 MG MT LOZG: 1.0000 | LOZENGE | OROMUCOSAL | Status: DC | PRN

## 2022-08-21 MED ORDER — ACETAMINOPHEN 325 MG PO TABS
650.0000 mg | ORAL_TABLET | Freq: Four times a day (QID) | ORAL | Status: DC | PRN
  Administered 2022-08-21 – 2022-08-22 (×2): 650 mg via ORAL
  Filled 2022-08-21 (×2): qty 2

## 2022-08-21 MED ORDER — CHLORHEXIDINE GLUCONATE 0.12 % MT SOLN
15.0000 mL | Freq: Once | OROMUCOSAL | Status: AC
  Administered 2022-08-21: 15 mL via OROMUCOSAL
  Filled 2022-08-21: qty 15

## 2022-08-21 MED ORDER — CEFAZOLIN SODIUM-DEXTROSE 2-4 GM/100ML-% IV SOLN
2.0000 g | INTRAVENOUS | Status: AC
  Administered 2022-08-21: 2 g via INTRAVENOUS
  Filled 2022-08-21: qty 100

## 2022-08-21 MED ORDER — MIDAZOLAM HCL 2 MG/2ML IJ SOLN
INTRAMUSCULAR | Status: AC
  Filled 2022-08-21: qty 2

## 2022-08-21 MED ORDER — OXYCODONE HCL 5 MG PO TABS
5.0000 mg | ORAL_TABLET | Freq: Once | ORAL | Status: AC | PRN
  Administered 2022-08-21: 5 mg via ORAL

## 2022-08-21 MED ORDER — ORAL CARE MOUTH RINSE: 15.0000 mL | Freq: Once | OROMUCOSAL | Status: AC

## 2022-08-21 MED ORDER — ONDANSETRON HCL 4 MG PO TABS: 4.0000 mg | ORAL_TABLET | Freq: Four times a day (QID) | ORAL | Status: DC | PRN

## 2022-08-21 MED ORDER — PROMETHAZINE HCL 25 MG PO TABS
25.0000 mg | ORAL_TABLET | Freq: Four times a day (QID) | ORAL | Status: DC | PRN
  Filled 2022-08-21: qty 1

## 2022-08-21 MED ORDER — ONDANSETRON HCL 4 MG/2ML IJ SOLN
INTRAMUSCULAR | Status: DC | PRN
  Administered 2022-08-21: 4 mg via INTRAVENOUS

## 2022-08-21 MED ORDER — FENTANYL CITRATE (PF) 250 MCG/5ML IJ SOLN
INTRAMUSCULAR | Status: DC | PRN
  Administered 2022-08-21 (×4): 50 ug via INTRAVENOUS

## 2022-08-21 MED ORDER — OXYCODONE HCL 5 MG/5ML PO SOLN: 5.0000 mg | Freq: Once | ORAL | Status: AC | PRN

## 2022-08-21 MED ORDER — POVIDONE-IODINE 10 % EX SWAB
2.0000 | Freq: Once | CUTANEOUS | Status: AC
  Administered 2022-08-21: 2 via TOPICAL

## 2022-08-21 MED ORDER — SODIUM CHLORIDE 0.9 % IR SOLN
Status: DC | PRN
  Administered 2022-08-21: 1000 mL

## 2022-08-21 MED ORDER — AMISULPRIDE (ANTIEMETIC) 5 MG/2ML IV SOLN
INTRAVENOUS | Status: AC
  Filled 2022-08-21: qty 4

## 2022-08-21 MED ORDER — GABAPENTIN 100 MG PO CAPS
100.0000 mg | ORAL_CAPSULE | Freq: Two times a day (BID) | ORAL | Status: DC
  Administered 2022-08-21 – 2022-08-23 (×5): 100 mg via ORAL
  Filled 2022-08-21 (×5): qty 1

## 2022-08-21 MED ORDER — PHENOL 1.4 % MT LIQD: 1.0000 | OROMUCOSAL | Status: DC | PRN

## 2022-08-21 MED ORDER — PANTOPRAZOLE SODIUM 40 MG PO TBEC
40.0000 mg | DELAYED_RELEASE_TABLET | Freq: Every day | ORAL | Status: DC
Start: 2022-08-21 — End: 2022-08-23
  Administered 2022-08-21 – 2022-08-22 (×2): 40 mg via ORAL
  Filled 2022-08-21 (×2): qty 1

## 2022-08-21 MED ORDER — TRANEXAMIC ACID-NACL 1000-0.7 MG/100ML-% IV SOLN
1000.0000 mg | INTRAVENOUS | Status: AC
  Administered 2022-08-21: 1000 mg via INTRAVENOUS
  Filled 2022-08-21: qty 100

## 2022-08-21 MED ORDER — ACETAMINOPHEN 160 MG/5ML PO SOLN: 325.0000 mg | ORAL | Status: DC | PRN

## 2022-08-21 MED ORDER — DIPHENHYDRAMINE HCL 12.5 MG/5ML PO ELIX: 12.5000 mg | ORAL_SOLUTION | ORAL | Status: DC | PRN

## 2022-08-21 MED ORDER — LIDOCAINE 2% (20 MG/ML) 5 ML SYRINGE
INTRAMUSCULAR | Status: DC | PRN
  Administered 2022-08-21: 60 mg via INTRAVENOUS

## 2022-08-21 MED ORDER — SODIUM CHLORIDE 0.9 % IV SOLN: INTRAVENOUS | Status: DC

## 2022-08-21 SURGICAL SUPPLY — 57 items
APL SKNCLS STERI-STRIP NONHPOA (GAUZE/BANDAGES/DRESSINGS) ×1
BAG COUNTER SPONGE SURGICOUNT (BAG) ×1 IMPLANT
BAG SPNG CNTER NS LX DISP (BAG) ×1
BENZOIN TINCTURE PRP APPL 2/3 (GAUZE/BANDAGES/DRESSINGS) ×1 IMPLANT
BLADE CLIPPER SURG (BLADE) IMPLANT
BLADE SAW SGTL 18X1.27X75 (BLADE) ×1 IMPLANT
COVER SURGICAL LIGHT HANDLE (MISCELLANEOUS) ×1 IMPLANT
CUP SECTOR GRIPTON 50MM (Cup) IMPLANT
DRAPE C-ARM 42X72 X-RAY (DRAPES) ×1 IMPLANT
DRAPE STERI IOBAN 125X83 (DRAPES) ×1 IMPLANT
DRAPE U-SHAPE 47X51 STRL (DRAPES) ×3 IMPLANT
DRSG AQUACEL AG ADV 3.5X10 (GAUZE/BANDAGES/DRESSINGS) ×1 IMPLANT
DURAPREP 26ML APPLICATOR (WOUND CARE) ×1 IMPLANT
ELECT BLADE 4.0 EZ CLEAN MEGAD (MISCELLANEOUS) ×1
ELECT BLADE 6.5 EXT (BLADE) IMPLANT
ELECT REM PT RETURN 9FT ADLT (ELECTROSURGICAL) ×1
ELECTRODE BLDE 4.0 EZ CLN MEGD (MISCELLANEOUS) ×1 IMPLANT
ELECTRODE REM PT RTRN 9FT ADLT (ELECTROSURGICAL) ×1 IMPLANT
FACESHIELD WRAPAROUND (MASK) ×3 IMPLANT
FACESHIELD WRAPAROUND OR TEAM (MASK) ×2 IMPLANT
GLOVE BIOGEL PI IND STRL 8 (GLOVE) ×2 IMPLANT
GLOVE BIOGEL PI INDICATOR 8 (GLOVE) ×2
GLOVE ECLIPSE 8.0 STRL XLNG CF (GLOVE) ×1 IMPLANT
GLOVE ORTHO TXT STRL SZ7.5 (GLOVE) ×2 IMPLANT
GOWN STRL REUS W/ TWL LRG LVL3 (GOWN DISPOSABLE) ×2 IMPLANT
GOWN STRL REUS W/ TWL XL LVL3 (GOWN DISPOSABLE) ×2 IMPLANT
GOWN STRL REUS W/TWL LRG LVL3 (GOWN DISPOSABLE) ×2
GOWN STRL REUS W/TWL XL LVL3 (GOWN DISPOSABLE) ×2
HANDPIECE INTERPULSE COAX TIP (DISPOSABLE) ×1
HEAD FEMORAL 32 CERAMIC (Hips) IMPLANT
KIT BASIN OR (CUSTOM PROCEDURE TRAY) ×1 IMPLANT
KIT TURNOVER KIT B (KITS) ×1 IMPLANT
LINER ACET PNNCL PLUS4 NEUTRAL (Hips) IMPLANT
MANIFOLD NEPTUNE II (INSTRUMENTS) ×1 IMPLANT
NS IRRIG 1000ML POUR BTL (IV SOLUTION) ×1 IMPLANT
PACK TOTAL JOINT (CUSTOM PROCEDURE TRAY) ×1 IMPLANT
PAD ARMBOARD 7.5X6 YLW CONV (MISCELLANEOUS) ×1 IMPLANT
PINNACLE PLUS 4 NEUTRAL (Hips) ×1 IMPLANT
SET HNDPC FAN SPRY TIP SCT (DISPOSABLE) ×1 IMPLANT
STAPLER VISISTAT 35W (STAPLE) IMPLANT
STEM FEM ACTIS STD SZ4 (Stem) IMPLANT
STRIP CLOSURE SKIN 1/2X4 (GAUZE/BANDAGES/DRESSINGS) ×2 IMPLANT
SUT ETHIBOND NAB CT1 #1 30IN (SUTURE) ×1 IMPLANT
SUT MNCRL AB 4-0 PS2 18 (SUTURE) IMPLANT
SUT VIC AB 0 CT1 27 (SUTURE) ×2
SUT VIC AB 0 CT1 27XBRD ANBCTR (SUTURE) ×1 IMPLANT
SUT VIC AB 1 CT1 27 (SUTURE) ×1
SUT VIC AB 1 CT1 27XBRD ANBCTR (SUTURE) ×1 IMPLANT
SUT VIC AB 2-0 CT1 27 (SUTURE) ×2
SUT VIC AB 2-0 CT1 TAPERPNT 27 (SUTURE) ×1 IMPLANT
TOWEL GREEN STERILE (TOWEL DISPOSABLE) ×1 IMPLANT
TOWEL GREEN STERILE FF (TOWEL DISPOSABLE) ×1 IMPLANT
TRAY CATH 16FR W/PLASTIC CATH (SET/KITS/TRAYS/PACK) IMPLANT
TRAY FOLEY W/BAG SLVR 16FR (SET/KITS/TRAYS/PACK)
TRAY FOLEY W/BAG SLVR 16FR ST (SET/KITS/TRAYS/PACK) IMPLANT
TUBE SUCT ARGYLE STRL (TUBING) IMPLANT
WATER STERILE IRR 1000ML POUR (IV SOLUTION) ×2 IMPLANT

## 2022-08-21 NOTE — H&P (Signed)
TOTAL HIP ADMISSION H&P  Patient is admitted for left total hip arthroplasty.  Subjective:  Chief Complaint: left hip pain  HPI: [name], 66 y.o. adult, has a history of pain and functional disability in the left hip(s) due to arthritis and patient has failed non-surgical conservative treatments for greater than 12 weeks to include NSAID's and/or analgesics, corticosteriod injections, use of assistive devices, and activity modification.  Onset of symptoms was gradual starting 3 years ago with gradually worsening course since that time.The patient noted no past surgery on the left hip(s).  Patient currently rates pain in the left hip at 10 out of 10 with activity. Patient has night pain, worsening of pain with activity and weight bearing, pain that interfers with activities of daily living, and pain with passive range of motion. Patient has evidence of subchondral cysts, subchondral sclerosis, periarticular osteophytes, and joint space narrowing by imaging studies. This condition presents safety issues increasing the risk of falls.  There is no current active infection.  Patient Active Problem List   Diagnosis Date Noted   Unilateral primary osteoarthritis, left hip 06/20/2022   Radiculopathy 07/19/2021   GAD (generalized anxiety disorder) 12/07/2020   Suicidal ideation    Closed 3-part fracture of proximal humerus 08/27/2019   Proximal humerus fracture    Vitamin B12 deficiency 03/26/2018   Acute blood loss anemia 10/25/2016   Primary osteoarthritis of right hip 10/08/2016   Borderline personality disorder ([place]) 10/08/2016   Migraines 10/08/2016   Chronic back pain 10/08/2016   Osteoarthritis of hips, bilateral 10/08/2016   UTI (urinary tract infection) 07/19/2014   Drug overdose 07/13/2014   Acute respiratory failure with hypoxia ([place]) 07/13/2014   Hypokalemia 07/13/2014   Hypotension, unspecified 07/13/2014   Suicidal ideations 07/13/2014   Past Medical History:   Diagnosis Date   ADHD    Anxiety    Arthritis    Carpal tunnel syndrome of left wrist    Complication of anesthesia    states she had to much and once adn ended up on o2, no problems since   Contracture of muscle of right upper arm    limited rom   Cubital tunnel syndrome on left    Depression    Dyspnea    with activity   GERD (gastroesophageal reflux disease)    History of kidney stones    History of suicide attempt 2012--  XANAX OVERDOSE   Hypotension    reports that her SBP can run < 90   Migraine    Neuropathy of both feet    Personality disorder (HCC)    Proximal humerus fracture    right   UTI (urinary tract infection)    on cipro   Vitamin B12 deficiency 03/26/2018    Past Surgical History:  Procedure Laterality Date   ANTERIOR CERVICAL DECOMPRESSION/DISCECTOMY FUSION 4 LEVELS N/A 07/19/2021   Procedure: ANTERIOR CERVICAL DECOMPRESSION FUSION CERVICAL 4- CERVICAL 5, CERVICAL 5- CERVICAL 6, CERVICAL 6- CERVICAL 7 WITH INSTRUMENTATION AND ALLOGRAFT;  Surgeon: Phylliss Bob, MD;  Location: [place];  Service: Orthopedics;  Laterality: N/A;   BACK SURGERY     CARPAL TUNNEL RELEASE Left 05/13/2013   Procedure: CARPAL TUNNEL RELEASE ENDOSCOPIC;  Surgeon: Jolyn Nap, MD;  Location: [facility];  Service: Orthopedics;  Laterality: Left;  Incision at 0955.    CERVICAL BIOPSY  W/ LOOP ELECTRODE EXCISION  12/01/2008   CIN II   COLONOSCOPY  2015   EYE SURGERY Bilateral 2019   cataracts  FEMORAL HERNIA REPAIR Left 10/31/2009   AND INGUINAL LYMPH NODE BX   LUMBAR [place] SURGERY  X3   LAST ONE 1989   FUSION   NERVE REPAIR Left 05/13/2013   Procedure: ULNAR NEUROPLASTY AT ELBOW;  Surgeon: Jolyn Nap, MD;  Location: [facility];  Service: Orthopedics;  Laterality: Left;   ORIF HUMERUS FRACTURE Right 08/27/2019   Procedure: OPEN REDUCTION INTERNAL FIXATION (ORIF) HUMERAL FRACTURE;  Surgeon: Renette Butters, MD;  Location: [place];  Service: Orthopedics;  Laterality: Right;   REPAIR PARTIAL FINGER AMPUTATION  1989   TONSILLECTOMY     TOTAL HIP ARTHROPLASTY Right 10/23/2016   TOTAL HIP ARTHROPLASTY Right 10/23/2016   Procedure: TOTAL HIP ARTHROPLASTY ANTERIOR APPROACH;  Surgeon: Renette Butters, MD;  Location: [facility];  Service: Orthopedics;  Laterality: Right;    Current Facility-Administered Medications  Medication Dose Route Frequency Provider Last Rate Last Admin   ceFAZolin (ANCEF) IVPB 2g/100 mL premix  2 g Intravenous On Call to OR Pete Pelt, PA-C       lactated ringers infusion   Intravenous Continuous Annye Asa, MD       tranexamic acid (CYKLOKAPRON) IVPB 1,000 mg  1,000 mg Intravenous To OR Pete Pelt, PA-C       Allergies  Allergen Reactions   Benzodiazepines Other (See Comments)    Pt preference due to withdrawals    Carbamazepine Other (See Comments) and Rash   Vortioxetine Rash and Other (See Comments)    headache   Citalopram     All SSRI's give her fever, insomnia   Cyclobenzaprine     Other reaction(s): urinary retention   Fluticasone     Other reaction(s): nervous   Other     All antibiotics give her a yeast infection   Zocor [Simvastatin] Anxiety    nervous    Social History   Tobacco Use   Smoking status: Former   Smokeless tobacco: Never   Tobacco comments:    quit smoking 25 years ago  Substance Use Topics   Alcohol use: No    Family History  Problem Relation Age of Onset   Breast cancer Mother      Review of Systems  All other systems reviewed and are negative.   Objective:  Physical Exam Vitals reviewed.  Constitutional:      Appearance: Normal appearance.  HENT:     Head: Normocephalic and atraumatic.  Eyes:     Extraocular Movements: Extraocular movements intact.     Pupils: Pupils are equal, round, and reactive to light.  Cardiovascular:     Rate and Rhythm: Normal rate and regular rhythm.  Pulmonary:     Effort:  Pulmonary effort is normal.     Breath sounds: Normal breath sounds.  Abdominal:     Palpations: Abdomen is soft.  Musculoskeletal:     Cervical back: Normal range of motion and neck supple.     Left hip: Tenderness and bony tenderness present. Decreased range of motion. Decreased strength.  Neurological:     Mental Status: She is alert and oriented to person, place, and time.  Psychiatric:        Behavior: Behavior normal.     Vital signs in last 24 hours: Temp:  [98.4 F (36.9 C)] 98.4 F (36.9 C) (08/29 0636) Pulse Rate:  [68] 68 (08/29 0636) Resp:  [20] 20 (08/29 0636) BP: (126)/(77) 126/77 (08/29 0636) SpO2:  [100 %] 100 % (08/29 0636)  Labs:   Estimated body mass index is 20.51 kg/m as calculated from the following:   Height as of 08/17/22: 5' (1.524 m).   Weight as of 08/17/22: 47.6 kg.   Imaging Review Plain radiographs demonstrate severe degenerative joint disease of the left hip(s). The bone quality appears to be good for age and reported activity level.      Assessment/Plan:  End stage arthritis, left hip(s)  The patient history, physical examination, clinical judgement of the provider and imaging studies are consistent with end stage degenerative joint disease of the left hip(s) and total hip arthroplasty is deemed medically necessary. The treatment options including medical management, injection therapy, arthroscopy and arthroplasty were discussed at length. The risks and benefits of total hip arthroplasty were presented and reviewed. The risks due to aseptic loosening, infection, stiffness, dislocation/subluxation,  thromboembolic complications and other imponderables were discussed.  The patient acknowledged the explanation, agreed to proceed with the plan and consent was signed. Patient is being admitted for inpatient treatment for surgery, pain control, PT, OT, prophylactic antibiotics, VTE prophylaxis, progressive ambulation and ADL's and discharge  planning.The patient is planning to be discharged home with home health services

## 2022-08-21 NOTE — Transfer of Care (Signed)
Immediate Anesthesia Transfer of Care Note  Patient: [name] [facility]  Procedure(s) Performed: LEFT TOTAL HIP ARTHROPLASTY ANTERIOR APPROACH (Left: Hip)  Patient Location: PACU  Anesthesia Type:General  Level of Consciousness: sedated and drowsy  Airway & Oxygen Therapy: Patient Spontanous Breathing and Patient connected to face mask oxygen  Post-op Assessment: Report given to RN and Post -op Vital signs reviewed and stable  Post vital signs: Reviewed and stable  Last Vitals:  Vitals Value Taken Time  BP 131/66 08/21/22 0904  Temp    Pulse 65 08/21/22 0905  Resp 11 08/21/22 0905  SpO2 100 % 08/21/22 0905  Vitals shown include unvalidated device data.  Last Pain:  Vitals:   08/21/22 0636  TempSrc: Oral  PainSc: 0-No pain         Complications: No notable events documented.

## 2022-08-21 NOTE — Plan of Care (Signed)
  Problem: Activity: Goal: Ability to avoid complications of mobility impairment will improve Outcome: Progressing Goal: Ability to tolerate increased activity will improve Outcome: Progressing   Problem: Pain Management: Goal: Pain level will decrease with appropriate interventions Outcome: Progressing   Problem: Skin Integrity: Goal: Will show signs of wound healing Outcome: Progressing

## 2022-08-21 NOTE — Op Note (Signed)
Operative Note  Date of procedure: 08/21/2022 Preoperative diagnosis: Left hip primary osteoarthritis Postoperative diagnosis: Same  Procedure: Left direct anterior total hip arthroplasty  Implants: DePuy sector GRIPTION acetabular component size 50, size 32+4 polyethylene liner, size 4 Actis femoral component with standard offset, size 32+1 ceramic hip ball  Surgeon: [name]. Susan Linden, MD Assistant: Benita Stabile, PA-C  Anesthesia: General Antibiotics: 2 g IV Ancef EBL: 517 cc Complications: None  Indications: The patient is a 65 year old female with well-documented debilitating arthritis involving her left hip.  She actually has had a right total hip arthroplasty by one of my colleagues in town.  She then presented to me with severe end-stage arthritis of the left hip.  She has tried and failed all forms conservative treatment for 3 years now.  At this point her left hip pain is daily and it is detrimentally affecting her mobility, her quality of life, and her actives daily living.  She does wish to proceed with a total hip arthroplasty on the left side and we agree with this as well given her x-ray findings and clinical exam findings.  Having had this before she is fully aware of the risk of acute blood loss anemia, nerve vessel injury, fracture, infection, DVT, implant failure, leg length differences in skin and soft tissue issues.  She understands her goals are hopefully decrease pain, improve mobility, and improve quality life.  Procedure description: After informed consent was obtained and the appropriate left hip was marked she was brought to the operating room where general anesthesia obtained while she is on her stretcher.  I then assessed her leg lengths and she is actually longer on her right total hip side than the left side that we are operating on.  The left side is shorter.  Although x-rays show that she is equal with her leg length clinically she is shorter so we will  compensate that with interoperative x-rays for lengthening her left side.  Traction boots were placed on both her feet and she was placed supine on the Hana fracture table with a perineal post in place in both legs and inline traction but no traction applied.  Her left operative hip was prepped and draped with DuraPrep and sterile drapes.  A timeout was called and she was identified as the correct patient and the correct left hip.  An incision was then made just inferior and posterior to the anterior superior iliac spine and carried obliquely down the leg.  Dissection was carried down to the tensor fascia lata muscle and the tensor fascia was divided longitudinally to proceed with a direct anterior approach to the hip.  Circumflex vessels were identified and cauterized.  The hip capsule was identified and opened up and L-type format finding a moderate joint effusion.  Cobra retractors were placed around the medial and lateral femoral neck and a femoral neck cut was made with an oscillating saw just proximal to the lesser trochanter and completed with an osteotome.  A corkscrew guide was placed in the femoral head and the femoral head was removed in its entirety finding areas with cartilage irregularities.  A bent Hohmann was then placed over the medial acetabular rim and movements of the rest of the labrum and other debris including osteophytes were removed from around the acetabulum.  We then began reaming under direct visitation starting with a 43 reamer going in stepwise increments up to a size 49 reamer with all reamers placed under regularization and the last reamer was also placed  under direct fluoroscopy so we could obtain our depth reaming, or inclination and her anteversion.  I then placed the real DePuy sector GRIPTION acetabular component size 50 and we went with a 32+4 polythene liner for that size of the acetabular component.  Attention was then turned to the femur.  With the leg externally rotated,  extended and add ducted, the leg was brought under and over.  This exposed the proximal femur.  A Mueller retractor was placed medially and a Hohmann retractor was placed behind the greater trochanter.  The lateral joint capsule was removed.  A box cutting osteotome was then used to enter the femoral canal.  We then began broaching using the Actis broaching system from a size 0 going to a size 4.  With the size 4 in place we trialed a standard offset femoral neck and a 32+1 trial hip ball.  We brought the leg back over and up and with traction and internal rotation reduced in the pelvis.  We were pleased with leg length, offset, range of motion and stability assessed both mechanically and radiographically.  We then dislocated the hip and remove the trial components.  We placed the real Actis femoral component size 4 with standard offset and the real 32+1 ceramic head ball and again reduce this in the acetabulum and we are pleased with stability, leg length, offset and range of motion.  Again these were assessed mechanically and radiographically.  The soft tissue was then irrigated with normal saline solution.  The joint capsule remnants were closed with interrupted #1 Ethibond suture followed by #1 Vicryl to close the tensor fascia.  0 Vicryl was used to close the deep tissue and 2-0 Vicryl is used to close subcutaneous tissue.  The skin was closed with staples.  An Aquacel dressing was applied.  The patient was taken off the Hana table, awakened, extubated and taken to recovery in stable condition with all final counts being correct and no complications noted.  Of note Benita Stabile, PA-C assisted in entire case from beginning to end and his assistance was medically necessary and crucial for soft tissue retraction and management, helping guide implant placement and a layered closure of the wound.

## 2022-08-21 NOTE — Evaluation (Signed)
Physical Therapy Evaluation Patient Details Name: [name] MRN: [id number] DOB: 05-23-1956 Today's Date: 08/21/2022  History of Present Illness  Pt is a 66 y/o female s/p L THA. PMH includes personality disorder, back sx, s/p ACDF.  Clinical Impression  Pt admitted secondary to problem above with deficits below. Pt requiring supervision for safety. Very anxious and restless throughout and did not want PT to provide hands on assist. Anticipate pt will progress well once symptoms improve. Will continue to follow acutely.        Recommendations for follow up therapy are one component of a multi-disciplinary discharge planning process, led by the attending physician.  Recommendations may be updated based on patient status, additional functional criteria and insurance authorization.  Follow Up Recommendations Follow physician's recommendations for discharge plan and follow up therapies      Assistance Recommended at Discharge Frequent or constant Supervision/Assistance  Patient can return home with the following  A little help with bathing/dressing/bathroom;Assistance with cooking/housework;Help with stairs or ramp for entrance;Assist for transportation    Equipment Recommendations None recommended by PT  Recommendations for Other Services       Functional Status Assessment Patient has had a recent decline in their functional status and demonstrates the ability to make significant improvements in function in a reasonable and predictable amount of time.     Precautions / Restrictions Precautions Precautions: Fall Restrictions Weight Bearing Restrictions: Yes LLE Weight Bearing: Weight bearing as tolerated      Mobility  Bed Mobility Overal bed mobility: Needs Assistance Bed Mobility: Supine to Sit     Supine to sit: Supervision     General bed mobility comments: Supervision for safety. Increased time secondary to pain    Transfers Overall transfer level: Needs  assistance Equipment used: Rolling walker (2 wheels) Transfers: Sit to/from Stand Sit to Stand: Supervision           General transfer comment: supervision for safety. Cues for hand placement.    Ambulation/Gait Ambulation/Gait assistance: Supervision Gait Distance (Feet): 5 Feet Assistive device: Rolling walker (2 wheels) Gait Pattern/deviations: Step-to pattern, Decreased step length - right, Decreased step length - left, Decreased weight shift to left Gait velocity: Decreased     General Gait Details: Slow, antalgic gait. Supervision for safety as pt would not allow hands on. Ambulated short distance to chair.  Stairs            Wheelchair Mobility    Modified Rankin (Stroke Patients Only)       Balance Overall balance assessment: Needs assistance Sitting-balance support: No upper extremity supported, Feet supported Sitting balance-Leahy Scale: Good     Standing balance support: Bilateral upper extremity supported Standing balance-Leahy Scale: Poor Standing balance comment: Reliant on UE support                             Pertinent Vitals/Pain Pain Assessment Pain Assessment: Faces Faces Pain Scale: Hurts whole lot Pain Location: L hip Pain Descriptors / Indicators: Grimacing, Guarding Pain Intervention(s): Limited activity within patient's tolerance, Monitored during session, Repositioned    Home Living Family/patient expects to be discharged to:: Private residence Living Arrangements: Alone Available Help at Discharge: Family Type of Home: House Home Access: Stairs to enter Entrance Stairs-Rails: Psychiatric nurse of Steps: 3   Home Layout: One level Home Equipment: Conservation officer, nature (2 wheels);Cane - single point      Prior Function Prior Level of Function : Independent/Modified Independent  Hand Dominance        Extremity/Trunk Assessment   Upper Extremity Assessment Upper  Extremity Assessment: Generalized weakness    Lower Extremity Assessment Lower Extremity Assessment: LLE deficits/detail LLE Deficits / Details: deficits consistent with post op pain and weakness.    Cervical / Trunk Assessment Cervical / Trunk Assessment: Normal  Communication   Communication: No difficulties  Cognition Arousal/Alertness: Awake/alert Behavior During Therapy: Anxious, Restless Overall Cognitive Status: No family/caregiver present to determine baseline cognitive functioning                                 General Comments: Pt very restless throughout and at times, tearful. Very independent and not letting PT touch her throughout        General Comments General comments (skin integrity, edema, etc.): Attempted to explain HEP, however, pt becoming tearful and reporting she knew the exercises PT was giving her already    Exercises     Assessment/Plan    PT Assessment Patient needs continued PT services  PT Problem List Decreased strength;Decreased activity tolerance;Decreased range of motion;Decreased balance;Decreased mobility;Decreased cognition;Decreased knowledge of use of DME;Decreased safety awareness;Decreased knowledge of precautions;Pain       PT Treatment Interventions DME instruction;Gait training;Functional mobility training;Stair training;Therapeutic activities;Therapeutic exercise;Balance training;Patient/family education    PT Goals (Current goals can be found in the Care Plan section)  Acute Rehab PT Goals Patient Stated Goal: "to get up" PT Goal Formulation: With patient Time For Goal Achievement: 09/04/22 Potential to Achieve Goals: Good    Frequency 7X/week     Co-evaluation               AM-PAC PT "6 Clicks" Mobility  Outcome Measure Help needed turning from your back to your side while in a flat bed without using bedrails?: A Little Help needed moving from lying on your back to sitting on the side of a flat bed  without using bedrails?: A Little Help needed moving to and from a bed to a chair (including a wheelchair)?: A Little Help needed standing up from a chair using your arms (e.g., wheelchair or bedside chair)?: A Little Help needed to walk in hospital room?: A Little Help needed climbing 3-5 steps with a railing? : A Little 6 Click Score: 18    End of Session   Activity Tolerance: Patient limited by pain Patient left: in chair;with nursing/sitter in room (in recliner in PACU bay) Nurse Communication: Mobility status PT Visit Diagnosis: Other abnormalities of gait and mobility (R26.89);Pain Pain - Right/Left: Left Pain - part of body: Hip    Time: 3295-1884 PT Time Calculation (min) (ACUTE ONLY): 16 min   Charges:   PT Evaluation $PT Eval Low Complexity: 1 Low          Lou Miner, DPT  Acute Rehabilitation Services  Office: [phone]   Rudean Hitt 08/21/2022, 12:24 PM

## 2022-08-21 NOTE — Anesthesia Procedure Notes (Signed)
Procedure Name: LMA Insertion Date/Time: 08/21/2022 7:40 AM  Performed by: Terrence Dupont, CRNAPre-anesthesia Checklist: Patient identified, Emergency Drugs available, Suction available and Patient being monitored Patient Re-evaluated:Patient Re-evaluated prior to induction Oxygen Delivery Method: Circle system utilized Preoxygenation: Pre-oxygenation with 100% oxygen Induction Type: IV induction Ventilation: Mask ventilation without difficulty LMA: LMA inserted LMA Size: 4.0 Tube type: Oral Number of attempts: 1 Airway Equipment and Method: Stylet and Oral airway Placement Confirmation: ETT inserted through vocal cords under direct vision, positive ETCO2 and breath sounds checked- equal and bilateral Tube secured with: Tape Dental Injury: Teeth and Oropharynx as per pre-operative assessment

## 2022-08-21 NOTE — Progress Notes (Signed)
Patient has been extremely rude and disrespectful to PACU staff during her entire recovery time. She is very degrading, using profanity, and passive aggressive. I reminded the patient we are here to provide the best care that we can provide to her while she is here and we will continue to do so however, she needs to respect the staff just as she is asking the staff to respect her.

## 2022-08-21 NOTE — Anesthesia Postprocedure Evaluation (Signed)
Anesthesia Post Note  Patient: [name] [facility]  Procedure(s) Performed: LEFT TOTAL HIP ARTHROPLASTY ANTERIOR APPROACH (Left: Hip)     Patient location during evaluation: PACU Anesthesia Type: General Level of consciousness: awake and alert Pain management: pain level controlled Vital Signs Assessment: post-procedure vital signs reviewed and stable Respiratory status: spontaneous breathing, nonlabored ventilation, respiratory function stable and patient connected to nasal cannula oxygen Cardiovascular status: blood pressure returned to baseline and stable Postop Assessment: no apparent nausea or vomiting Anesthetic complications: no   No notable events documented.  Last Vitals:  Vitals:   08/21/22 1115 08/21/22 1130  BP: 133/65 98/68  Pulse: 64 65  Resp: 11 13  Temp:    SpO2: 100% 100%    Last Pain:  Vitals:   08/21/22 1130  TempSrc:   PainSc: Asleep                 [name]

## 2022-08-22 DIAGNOSIS — M1612 Unilateral primary osteoarthritis, left hip: Secondary | ICD-10-CM | POA: Diagnosis not present

## 2022-08-22 NOTE — Progress Notes (Signed)
Physical Therapy Treatment Patient Details Name: [name] MRN: [id number] DOB: [birth year] Today's Date: 08/22/2022   History of Present Illness Pt is a 66 y/o female s/p L THA. PMH includes personality disorder, back sx, s/p ACDF.    PT Comments    Pt agreeable to treatment session. Pt able to increase gait distance to 250 feet with RW and supervision. Pt also completed stair training specific to her home environment with good performance. Continue with current plan.    Recommendations for follow up therapy are one component of a multi-disciplinary discharge planning process, led by the attending physician.  Recommendations may be updated based on patient status, additional functional criteria and insurance authorization.  Follow Up Recommendations  Follow physician's recommendations for discharge plan and follow up therapies     Assistance Recommended at Discharge Frequent or constant Supervision/Assistance  Patient can return home with the following A little help with bathing/dressing/bathroom;Assistance with cooking/housework;Help with stairs or ramp for entrance;Assist for transportation   Equipment Recommendations  None recommended by PT    Recommendations for Other Services       Precautions / Restrictions Precautions Precautions: Fall Restrictions Weight Bearing Restrictions: Yes LLE Weight Bearing: Weight bearing as tolerated     Mobility  Bed Mobility Overal bed mobility: Needs Assistance Bed Mobility: Supine to Sit     Supine to sit: Supervision     General bed mobility comments: Supervision for safety. Max time secondary to pain and L LE weakness    Transfers Overall transfer level: Needs assistance Equipment used: Rolling walker (2 wheels) Transfers: Sit to/from Stand Sit to Stand: Supervision           General transfer comment: supervision for safety    Ambulation/Gait Ambulation/Gait assistance: Supervision Gait Distance (Feet):  250 Feet Assistive device: Rolling walker (2 wheels) Gait Pattern/deviations: Step-to pattern, Decreased step length - right, Decreased step length - left, Decreased weight shift to left Gait velocity: Decreased Gait velocity interpretation: 1.31 - 2.62 ft/sec, indicative of limited community ambulator   General Gait Details: Pt performed modified three point pattern as instructed. Pt initially with lacking of heel-toe sequencing on left but able to correct with cues. Pt performed distances of 50 feet and 200 feet respectively with stair training between trials. Pt with inconsistent cadence.   Stairs Stairs: Yes Stairs assistance: Supervision Stair Management: Step to pattern, Two rails, Forwards Number of Stairs: 3 General stair comments: Pt ascended/descended 3 stairs respectively. Cues and demonstration provided for technique and sequencing. Pt with no LOB. Pt refused therapist CGA.   Wheelchair Mobility    Modified Rankin (Stroke Patients Only)       Balance Overall balance assessment: Needs assistance Sitting-balance support: No upper extremity supported, Feet supported Sitting balance-Leahy Scale: Good     Standing balance support: Bilateral upper extremity supported, No upper extremity supported Standing balance-Leahy Scale: Fair Standing balance comment: Pt able to maintain static standing balance without UE use.                            Cognition Arousal/Alertness: Awake/alert Behavior During Therapy: Anxious, Restless Overall Cognitive Status: No family/caregiver present to determine baseline cognitive functioning                                 General Comments: Pt very independent and adamant about PT assisting her physically. Agreed for PT to  place gait belt but not willing to allow therapist to utilize belt.        Exercises      General Comments        Pertinent Vitals/Pain Pain Assessment Pain Assessment: 0-10 Pain  Score:  (pt would not quantify) Pain Location: L hip Pain Descriptors / Indicators: Grimacing, Guarding Pain Intervention(s): Premedicated before session, Monitored during session, Limited activity within patient's tolerance (declined ice)    Home Living                          Prior Function            PT Goals (current goals can now be found in the care plan section) Acute Rehab PT Goals Patient Stated Goal: "to get up" PT Goal Formulation: With patient Time For Goal Achievement: 09/04/22 Potential to Achieve Goals: Good Progress towards PT goals: Progressing toward goals    Frequency    7X/week      PT Plan Current plan remains appropriate    Co-evaluation              AM-PAC PT "6 Clicks" Mobility   Outcome Measure  Help needed turning from your back to your side while in a flat bed without using bedrails?: A Little Help needed moving from lying on your back to sitting on the side of a flat bed without using bedrails?: A Little Help needed moving to and from a bed to a chair (including a wheelchair)?: A Little Help needed standing up from a chair using your arms (e.g., wheelchair or bedside chair)?: A Little Help needed to walk in hospital room?: A Little Help needed climbing 3-5 steps with a railing? : A Little 6 Click Score: 18    End of Session Equipment Utilized During Treatment: Gait belt Activity Tolerance: Patient limited by pain Patient left: in chair;with call bell/phone within reach Nurse Communication: Mobility status PT Visit Diagnosis: Other abnormalities of gait and mobility (R26.89);Pain Pain - Right/Left: Left Pain - part of body: Hip     Time: 5670-1410 PT Time Calculation (min) (ACUTE ONLY): 31 min  Charges:  $Gait Training: 8-22 mins $Therapeutic Activity: 8-22 mins                    Donna Bernard, PT    [facility] 08/22/2022, 10:44 AM

## 2022-08-22 NOTE — Progress Notes (Signed)
Subjective: 1 Day Post-Op Procedure(s) (LRB): LEFT TOTAL HIP ARTHROPLASTY ANTERIOR APPROACH (Left) Patient reports pain as moderate.    Objective: Vital signs in last 24 hours: Temp:  [98.4 F (36.9 C)-100.6 F (38.1 C)] 99.2 F (37.3 C) (08/30 0250) Pulse Rate:  [55-121] 85 (08/30 0250) Resp:  [10-20] 20 (08/30 0250) BP: (98-154)/(56-95) 103/65 (08/30 0250) SpO2:  [97 %-100 %] 99 % (08/30 0250) Weight:  [47 kg] 47 kg (08/29 1810)  Intake/Output from previous day: 08/29 0701 - 08/30 0700 In: 2141 [P.O.:831; I.V.:1210; IV Piggyback:100] Out: 350 [Blood:350] Intake/Output this shift: No intake/output data recorded.  No results for input(s): "HGB" in the last 72 hours. No results for input(s): "WBC", "RBC", "HCT", "PLT" in the last 72 hours. No results for input(s): "NA", "K", "CL", "CO2", "BUN", "CREATININE", "GLUCOSE", "CALCIUM" in the last 72 hours. No results for input(s): "LABPT", "INR" in the last 72 hours.  Sensation intact distally Intact pulses distally Dorsiflexion/Plantar flexion intact Incision: dressing C/D/I   Assessment/Plan: 1 Day Post-Op Procedure(s) (LRB): LEFT TOTAL HIP ARTHROPLASTY ANTERIOR APPROACH (Left) Up with therapy Plan for discharge tomorrow Discharge home with home health      Mcarthur Rossetti 08/22/2022, 7:24 AM

## 2022-08-22 NOTE — Progress Notes (Signed)
Physical Therapy Treatment Patient Details Name: [name] MRN: [id number] DOB: [birth year] Today's Date: 08/22/2022   History of Present Illness Pt is a 66 y/o female s/p L THA. PMH includes personality disorder, back sx, s/p ACDF.    PT Comments    Pt reported just returning to bed from chair but agreeable to treatment. Pt instructed in and completed gait training without incident (125 feet with RW). Pt impulsive and agitated at times. Continue as tolerated.   Recommendations for follow up therapy are one component of a multi-disciplinary discharge planning process, led by the attending physician.  Recommendations may be updated based on patient status, additional functional criteria and insurance authorization.  Follow Up Recommendations  Follow physician's recommendations for discharge plan and follow up therapies     Assistance Recommended at Discharge Frequent or constant Supervision/Assistance  Patient can return home with the following A little help with bathing/dressing/bathroom;Assistance with cooking/housework;Help with stairs or ramp for entrance;Assist for transportation   Equipment Recommendations  None recommended by PT    Recommendations for Other Services       Precautions / Restrictions Precautions Precautions: Fall Restrictions Weight Bearing Restrictions: Yes LLE Weight Bearing: Weight bearing as tolerated     Mobility  Bed Mobility Overal bed mobility: Needs Assistance Bed Mobility: Supine to Sit, Sit to Supine     Supine to sit: Supervision Sit to supine: Min assist   General bed mobility comments: Supervision for safety during supine to sit. Min assist for LE management when returning to bed.    Transfers Overall transfer level: Needs assistance Equipment used: Rolling walker (2 wheels) Transfers: Sit to/from Stand Sit to Stand: Supervision           General transfer comment: supervision for safety     Ambulation/Gait Ambulation/Gait assistance: Supervision Gait Distance (Feet): 125 Feet Assistive device: Rolling walker (2 wheels) Gait Pattern/deviations: Step-to pattern, Decreased step length - right, Decreased step length - left, Decreased weight shift to left, Antalgic Gait velocity: Decreased Gait velocity interpretation: 1.31 - 2.62 ft/sec, indicative of limited community ambulator   General Gait Details: Pt performed modified three point pattern as instructed. Pt with inconsistent cadence. No LOB occurred. Pt c/o R shoulder pain when using RW.    Wheelchair Mobility    Modified Rankin (Stroke Patients Only)       Balance Overall balance assessment: Needs assistance Sitting-balance support: No upper extremity supported, Feet supported Sitting balance-Leahy Scale: Good     Standing balance support: Bilateral upper extremity supported, No upper extremity supported Standing balance-Leahy Scale: Fair Standing balance comment: Pt able to maintain static standing balance without UE use.                            Cognition Arousal/Alertness: Awake/alert Behavior During Therapy: Anxious, Restless, Agitated Impulsive at times during gait Overall Cognitive Status: No family/caregiver present to determine baseline cognitive functioning                                 General Comments: Pt very independent and adamant about PT assisting her physically.         Exercises      General Comments        Pertinent Vitals/Pain Pain Assessment Pain Assessment: 0-10 Pain Score:  (DNQ) Pain Location: L hip Pain Descriptors / Indicators: Grimacing, Guarding Pain Intervention(s): Monitored during session, Limited activity  within patient's tolerance, Patient requesting pain meds-RN notified    Home Living                          Prior Function            PT Goals (current goals can now be found in the care plan section) Acute Rehab  PT Goals Patient Stated Goal: "to get up" PT Goal Formulation: With patient Time For Goal Achievement: 09/04/22 Potential to Achieve Goals: Good Progress towards PT goals: Progressing toward goals    Frequency    7X/week      PT Plan Current plan remains appropriate    Co-evaluation              AM-PAC PT "6 Clicks" Mobility   Outcome Measure  Help needed turning from your back to your side while in a flat bed without using bedrails?: A Little Help needed moving from lying on your back to sitting on the side of a flat bed without using bedrails?: A Little Help needed moving to and from a bed to a chair (including a wheelchair)?: A Little Help needed standing up from a chair using your arms (e.g., wheelchair or bedside chair)?: A Little Help needed to walk in hospital room?: A Little Help needed climbing 3-5 steps with a railing? : A Little 6 Click Score: 18    End of Session Equipment Utilized During Treatment: Gait belt Activity Tolerance: Patient limited by pain Patient left: in chair;with call bell/phone within reach Nurse Communication: Mobility status PT Visit Diagnosis: Other abnormalities of gait and mobility (R26.89);Pain Pain - Right/Left: Left Pain - part of body: Hip     Time: 1342-1400 PT Time Calculation (min) (ACUTE ONLY): 18 min  Charges:  $Gait Training: 8-22 mins $Therapeutic Activity: 8-22 mins                     Donna Bernard, PT    [facility] 08/22/2022, 2:07 PM

## 2022-08-22 NOTE — Discharge Instructions (Signed)

## 2022-08-23 DIAGNOSIS — M1612 Unilateral primary osteoarthritis, left hip: Secondary | ICD-10-CM | POA: Diagnosis not present

## 2022-08-23 MED ORDER — ASPIRIN 81 MG PO CHEW: 81.0000 mg | CHEWABLE_TABLET | Freq: Two times a day (BID) | ORAL | 0 refills | Status: DC

## 2022-08-23 MED ORDER — ACETAMINOPHEN 325 MG PO TABS: 650.0000 mg | ORAL_TABLET | Freq: Four times a day (QID) | ORAL | 0 refills | Status: DC | PRN

## 2022-08-23 MED ORDER — OXYCODONE HCL 5 MG PO TABS: 5.0000 mg | ORAL_TABLET | Freq: Four times a day (QID) | ORAL | 0 refills | Status: DC | PRN

## 2022-08-23 NOTE — Progress Notes (Signed)
PA was informed of the pt's temp. Pt refused Tylenol, only got Aspirin and Oxycodone. 100.3 was her previous temp. Will encourage Incentive Spirometer.  08/23/22 0936  Vitals  Temp (!) 100.7 F (38.2 C)  Temp Source Oral  Pulse Rate 99  MEWS COLOR  MEWS Score Color Green  MEWS Score  MEWS Temp 1  MEWS Systolic 0  MEWS Pulse 0  MEWS RR 0  MEWS LOC 0  MEWS Score 1

## 2022-08-23 NOTE — Progress Notes (Signed)
Subjective: 2 Days Post-Op Procedure(s) (LRB): LEFT TOTAL HIP ARTHROPLASTY ANTERIOR APPROACH (Left) Patient reports pain as moderate.  Progressing well with PT.   Objective: Vital signs in last 24 hours: Temp:  [99.8 F (37.7 C)-100.7 F (38.2 C)] 100.2 F (37.9 C) (08/31 1047) Pulse Rate:  [92-101] 99 (08/31 0936) Resp:  [18-19] 19 (08/31 0723) BP: (104-117)/(57-67) 108/64 (08/31 0723) SpO2:  [100 %] 100 % (08/31 0723)  Intake/Output from previous day: No intake/output data recorded. Intake/Output this shift: No intake/output data recorded.  No results for input(s): "HGB" in the last 72 hours. No results for input(s): "WBC", "RBC", "HCT", "PLT" in the last 72 hours. No results for input(s): "NA", "K", "CL", "CO2", "BUN", "CREATININE", "GLUCOSE", "CALCIUM" in the last 72 hours. No results for input(s): "LABPT", "INR" in the last 72 hours.  Dorsiflexion/Plantar flexion intact Incision: dressing C/D/I Compartment soft   Assessment/Plan: 2 Days Post-Op Procedure(s) (LRB): LEFT TOTAL HIP ARTHROPLASTY ANTERIOR APPROACH (Left) Discharge home with home health Continue incentive spirometry at home.    Cordell Guercio 08/23/2022, 11:46 AM

## 2022-08-23 NOTE — Progress Notes (Signed)
Physical Therapy Treatment Patient Details Name: [name] MRN: [id number] DOB: 02-May-1956 Today's Date: 08/23/2022   History of Present Illness Pt is a 66 y/o female s/p L THA. PMH includes personality disorder, back sx, s/p ACDF.    PT Comments    Pt instructed in and completed gait training and therapeutic exercises. Pt able to increase gait distance to 550 feet with RW. Pt with painful weakness during left heel slides which limited ROM and reps performed. Progress as tolerated.   Recommendations for follow up therapy are one component of a multi-disciplinary discharge planning process, led by the attending physician.  Recommendations may be updated based on patient status, additional functional criteria and insurance authorization.  Follow Up Recommendations  Follow physician's recommendations for discharge plan and follow up therapies     Assistance Recommended at Discharge Frequent or constant Supervision/Assistance  Patient can return home with the following A little help with bathing/dressing/bathroom;Assistance with cooking/housework;Help with stairs or ramp for entrance;Assist for transportation   Equipment Recommendations  None recommended by PT    Recommendations for Other Services       Precautions / Restrictions Precautions Precautions: Fall Restrictions Weight Bearing Restrictions: Yes LLE Weight Bearing: Weight bearing as tolerated     Mobility  Bed Mobility Overal bed mobility: Needs Assistance Bed Mobility: Supine to Sit     Supine to sit: Supervision     General bed mobility comments: Supervision for safety during supine to sit. Pt refused lying back down in bed after session.    Transfers Overall transfer level: Needs assistance Equipment used: Rolling walker (2 wheels) Transfers: Sit to/from Stand Sit to Stand: Supervision           General transfer comment: supervision for safety    Ambulation/Gait Ambulation/Gait  assistance: Supervision Gait Distance (Feet): 550 Feet Assistive device: Rolling walker (2 wheels) Gait Pattern/deviations: Step-to pattern, Decreased step length - right, Decreased step length - left, Decreased weight shift to left, Antalgic Gait velocity: Decreased Gait velocity interpretation: 1.31 - 2.62 ft/sec, indicative of limited community ambulator   General Gait Details: Pt performed modified three point pattern. Pt with inconsistent cadence and required several standing rest breaks. No LOB occurred. Pt c/o R shoulder pain when using RW.   Stairs             Wheelchair Mobility    Modified Rankin (Stroke Patients Only)       Balance Overall balance assessment: Needs assistance Sitting-balance support: No upper extremity supported, Feet supported Sitting balance-Leahy Scale: Good     Standing balance support: Bilateral upper extremity supported, No upper extremity supported Standing balance-Leahy Scale: Fair Standing balance comment: Pt able to maintain static standing balance without UE use.                            Cognition Arousal/Alertness: Awake/alert Behavior During Therapy: Anxious, Restless, Agitated Overall Cognitive Status: No family/caregiver present to determine baseline cognitive functioning                                 General Comments: Pt very independent and adamant about PT assisting her physically. Pt with negative and condescending behavior.        Exercises General Exercises - Lower Extremity Quad Sets: Left, 10 reps, Supine (3 sec holds) Gluteal Sets: Both, 10 reps, Supine (3 sec holds) Heel Slides: Left, Supine (8  reps) Heel Raises: Both, 10 reps, Standing (with RW)    General Comments        Pertinent Vitals/Pain Pain Assessment Pain Assessment: 0-10 Pain Score: 5  Pain Location: L hip Pain Descriptors / Indicators: Grimacing, Guarding Pain Intervention(s): Limited activity within patient's  tolerance, Monitored during session    Home Living                          Prior Function            PT Goals (current goals can now be found in the care plan section) Acute Rehab PT Goals Patient Stated Goal: "to get up" PT Goal Formulation: With patient Time For Goal Achievement: 09/04/22 Potential to Achieve Goals: Good Progress towards PT goals: Progressing toward goals    Frequency    7X/week      PT Plan Current plan remains appropriate    Co-evaluation              AM-PAC PT "6 Clicks" Mobility   Outcome Measure  Help needed turning from your back to your side while in a flat bed without using bedrails?: A Little Help needed moving from lying on your back to sitting on the side of a flat bed without using bedrails?: A Little Help needed moving to and from a bed to a chair (including a wheelchair)?: A Little Help needed standing up from a chair using your arms (e.g., wheelchair or bedside chair)?: A Little Help needed to walk in hospital room?: A Little Help needed climbing 3-5 steps with a railing? : A Little 6 Click Score: 18    End of Session   Activity Tolerance: Patient limited by pain Patient left: in chair;with call bell/phone within reach Nurse Communication: Mobility status PT Visit Diagnosis: Other abnormalities of gait and mobility (R26.89);Pain Pain - Right/Left: Left Pain - part of body: Hip     Time: 3790-2409 PT Time Calculation (min) (ACUTE ONLY): 28 min  Charges:  $Gait Training: 8-22 mins $Therapeutic Exercise: 8-22 mins                    Donna Bernard, PT    [facility] 08/23/2022, 10:21 AM

## 2022-08-23 NOTE — Discharge Summary (Signed)
Patient ID: [name] MRN: [id number] DOB/AGE: 1956-03-18 66 y.o.  Admit date: 08/21/2022 Discharge date: 08/23/2022  Admission Diagnoses:  Principal Problem:   Unilateral primary osteoarthritis, left hip Active Problems:   Status post total replacement of left hip   Discharge Diagnoses:  Same  Past Medical History:  Diagnosis Date   ADHD    Anxiety    Arthritis    Carpal tunnel syndrome of left wrist    Complication of anesthesia    states she had to much and once adn ended up on o2, no problems since   Contracture of muscle of right upper arm    limited rom   Cubital tunnel syndrome on left    Depression    Dyspnea    with activity   GERD (gastroesophageal reflux disease)    History of kidney stones    History of suicide attempt 2012--  XANAX OVERDOSE   Hypotension    reports that her SBP can run < 90   Migraine    Neuropathy of both feet    Personality disorder (HCC)    Proximal humerus fracture    right   UTI (urinary tract infection)    on cipro   Vitamin B12 deficiency 03/26/2018    Surgeries: Procedure(s): LEFT TOTAL HIP ARTHROPLASTY ANTERIOR APPROACH on 08/21/2022   Consultants:   Discharged Condition: Improved  Hospital Course: [name] is an 66 y.o. adult who was admitted 08/21/2022 for operative treatment ofUnilateral primary osteoarthritis, left hip. Patient has severe unremitting pain that affects sleep, daily activities, and work/hobbies. After pre-op clearance the patient was taken to the operating room on 08/21/2022 and underwent  Procedure(s): LEFT TOTAL HIP ARTHROPLASTY ANTERIOR APPROACH.    Patient was given perioperative antibiotics:  Anti-infectives (From admission, onward)    Start     Dose/Rate Route Frequency Ordered Stop   08/21/22 2200  valACYclovir (VALTREX) tablet 500 mg        500 mg Oral Daily at bedtime 08/21/22 1240     08/21/22 0600  ceFAZolin (ANCEF) IVPB 2g/100 mL premix        2 g 200 mL/hr over 30  Minutes Intravenous On call to O.R. 08/21/22 1696 08/21/22 0815        Patient was given sequential compression devices, early ambulation, and chemoprophylaxis to prevent DVT.  Patient benefited maximally from hospital stay and there were no complications.    Recent vital signs: Patient Vitals for the past 24 hrs:  BP Temp Temp src Pulse Resp SpO2  08/23/22 1047 -- 100.2 F (37.9 C) Oral -- -- --  08/23/22 0936 -- (!) 100.7 F (38.2 C) Oral 99 -- --  08/23/22 0723 108/64 100.3 F (37.9 C) Oral -- 19 100 %  08/23/22 0506 117/63 99.8 F (37.7 C) Oral (!) 101 19 100 %  08/22/22 2025 104/67 99.8 F (37.7 C) Oral 97 18 100 %  08/22/22 1930 -- 100.1 F (37.8 C) Oral -- -- --  08/22/22 1627 (!) 108/57 (!) 100.5 F (38.1 C) -- 92 -- --     Recent laboratory studies: No results for input(s): "WBC", "HGB", "HCT", "PLT", "NA", "K", "CL", "CO2", "BUN", "CREATININE", "GLUCOSE", "INR", "CALCIUM" in the last 72 hours.  Invalid input(s): "PT", "2"   Discharge Medications:   Allergies as of 08/23/2022       Reactions   Benzodiazepines Other (See Comments)   Pt preference due to withdrawals    Carbamazepine Other (See Comments), Rash   Vortioxetine  Rash, Other (See Comments)   headache   Citalopram    All SSRI's give her fever, insomnia   Cyclobenzaprine    Other reaction(s): urinary retention   Fluticasone    Other reaction(s): nervous   Other    All antibiotics give her a yeast infection   Zocor [simvastatin] Anxiety   nervous        Medication List     STOP taking these medications    HYDROcodone-acetaminophen 5-325 MG tablet Commonly known as: NORCO/VICODIN       TAKE these medications    acetaminophen 325 MG tablet Commonly known as: TYLENOL Take 2 tablets (650 mg total) by mouth every 6 (six) hours as needed for fever or mild pain (Every 6 hours PRN for mild pain, pain score 1-3 or temp > 100.5). What changed:  medication strength how much to take reasons  to take this   aspirin 81 MG chewable tablet Chew 1 tablet (81 mg total) by mouth 2 (two) times daily.   diclofenac Sodium 1 % Gel Commonly known as: VOLTAREN Apply 1 application topically 4 (four) times daily as needed (pain).   gabapentin 300 MG capsule Commonly known as: NEURONTIN Take 100 mg by mouth 2 (two) times daily.   MELATONIN PO Take 50 mg by mouth at bedtime.   oxyCODONE 5 MG immediate release tablet Commonly known as: Oxy IR/ROXICODONE Take 1 tablet (5 mg total) by mouth every 6 (six) hours as needed for moderate pain (pain score 4-6).   pantoprazole 40 MG tablet Commonly known as: PROTONIX Take 40 mg by mouth at bedtime.   promethazine 25 MG tablet Commonly known as: PHENERGAN Take 1 tablet (25 mg total) by mouth every 6 (six) hours as needed for nausea or vomiting.   SUMAtriptan 100 MG tablet Commonly known as: IMITREX Take 100 mg by mouth every 2 (two) hours as needed for migraine. May repeat in 2 hours if headache persists or recurs.   tiZANidine 4 MG tablet Commonly known as: ZANAFLEX Take 1 tablet (4 mg total) by mouth at bedtime. What changed:  when to take this reasons to take this   topiramate 200 MG tablet Commonly known as: TOPAMAX Take 200 mg by mouth daily.   valACYclovir 500 MG tablet Commonly known as: VALTREX Take 500 mg by mouth at bedtime.               Durable Medical Equipment  (From admission, onward)           Start     Ordered   08/21/22 1241  DME 3 n 1  Once        08/21/22 1240   08/21/22 1032  DME Walker rolling  Once       Question Answer Comment  Walker: With 5 Inch Wheels   Patient needs a walker to treat with the following condition Status post total replacement of left hip      08/21/22 1031            Diagnostic Studies: DG Pelvis Portable  Result Date: 08/21/2022 CLINICAL DATA:  Left total hip replacement EXAM: PORTABLE PELVIS 1-2 VIEWS COMPARISON:  None Available. FINDINGS: Remote changes of  right hip replacement. Changes of left hip replacement. Normal AP alignment. No hardware bony complicating feature. IMPRESSION: Left hip replacement.  No complicating feature. Electronically Signed   By: Rolm Baptise M.D.   On: 08/21/2022 09:38   DG HIP UNILAT WITH PELVIS 1V LEFT  Result Date: 08/21/2022  CLINICAL DATA:  Anterior left total hip arthroplasty EXAM: DG HIP (WITH OR WITHOUT PELVIS) 1V*L*; DG C-ARM 1-60 MIN-NO REPORT COMPARISON:  None Available. FLUOROSCOPY TIME:  Radiation Exposure Index (if provided by the fluoroscopic device): 1.125 mGy FINDINGS: Multiple nondiagnostic spot fluoroscopic intraoperative left hip radiographs demonstrate expected postsurgical changes from total left hip arthroplasty with no evidence of hip dislocation on these views. Previous right total hip arthroplasty. IMPRESSION: Intraoperative fluoroscopic guidance for left total hip arthroplasty. Electronically Signed   By: Ilona Sorrel M.D.   On: 08/21/2022 08:51   DG C-Arm 1-60 Min-No Report  Result Date: 08/21/2022 Fluoroscopy was utilized by the requesting physician.  No radiographic interpretation.    Disposition: Discharge disposition: 06-Home-Health Care Svc          Follow-up Information     Mcarthur Rossetti, MD Follow up in 2 week(s).   Specialty: Orthopedic Surgery Contact information: [address] [place] [address] [phone]                  Signed: Erskine Emery 08/23/2022, 12:03 PM

## 2022-08-24 LAB — TYPE AND SCREEN
ABO/RH(D): A POS
Antibody Screen: POSITIVE
Donor AG Type: NEGATIVE
Donor AG Type: NEGATIVE
Unit division: 0
Unit division: 0

## 2022-08-24 LAB — BPAM RBC
Blood Product Expiration Date: 202309252359
Blood Product Expiration Date: 202309282359
Unit Type and Rh: 6200
Unit Type and Rh: 6200

## 2022-08-30 ENCOUNTER — Telehealth: Payer: Self-pay

## 2022-08-30 NOTE — Telephone Encounter (Signed)
Patient aware the swelling under her incision is a seroma, very "normal" after  THA. Bruising also very normal, she states she hasn't been elevating her leg at all. I told her to start elevating her leg, above the heart and her swelling should subside tremendously. Denies calf pain.

## 2022-08-30 NOTE — Telephone Encounter (Signed)
Patient called stating that she is weak and may have a fever, her left leg is extremely warm, swollen and her left knee is yellow and purple.  Patient had left hip surgery on 08/21/2022.  Would like to be seen?  Patient has an appointment on Tuesday, 09/04/2022.  CB# [phone].  Please advise.  Thank you.

## 2022-09-04 ENCOUNTER — Ambulatory Visit (INDEPENDENT_AMBULATORY_CARE_PROVIDER_SITE_OTHER): Payer: Medicare HMO | Admitting: Orthopaedic Surgery

## 2022-09-04 ENCOUNTER — Encounter: Payer: Self-pay | Admitting: Orthopaedic Surgery

## 2022-09-04 DIAGNOSIS — Z96642 Presence of left artificial hip joint: Secondary | ICD-10-CM

## 2022-09-04 MED ORDER — TIZANIDINE HCL 4 MG PO TABS: 4.0000 mg | ORAL_TABLET | Freq: Three times a day (TID) | ORAL | 1 refills | Status: DC | PRN

## 2022-09-04 NOTE — Progress Notes (Signed)
The patient is 2 weeks status post a left total hip arthroplasty.  One of my colleagues in town replaced her other hip several years ago.  She is doing well.  She has been taking aspirin only once a day.  She is requesting a muscle relaxant.  She drove today and has been ambulating with a cane.  Her left hip incision looks good.  The staples are removed and Steri-Strips applied.  She does have a moderate seroma and I did aspirate 50 cc of fluid from around the hip.  Her calf is soft and left foot and ankle not swollen.  She can stop her aspirin.  I will send in some more Zanaflex for her.  The next time I need to see her is not for 4 weeks to see how she is doing from mobility standpoint but no x-rays are needed.  If

## 2022-10-02 ENCOUNTER — Ambulatory Visit (INDEPENDENT_AMBULATORY_CARE_PROVIDER_SITE_OTHER): Payer: Medicare HMO | Admitting: Orthopaedic Surgery

## 2022-10-02 ENCOUNTER — Encounter: Payer: Self-pay | Admitting: Orthopaedic Surgery

## 2022-10-02 DIAGNOSIS — Z96642 Presence of left artificial hip joint: Secondary | ICD-10-CM

## 2022-10-02 MED ORDER — TIZANIDINE HCL 4 MG PO TABS: 4.0000 mg | ORAL_TABLET | Freq: Three times a day (TID) | ORAL | 1 refills | Status: AC | PRN

## 2022-10-02 NOTE — Progress Notes (Signed)
HPI: [name] returns today status post left total hip arthroplasty 08/21/2022.  She is overall doing well.  She states that she is able to bend over and touch her toes and put on her socks to which she was unable to do prior to surgery.  She does note some swelling still about the hip.  But feels overall she is trending towards improvement.  She is asking for refill on her Zanaflex.  She is on chronic pain medication.  States she has minimal discomfort in the left hip at this point in time.   Review of systems: See HPI otherwise negative  Physical exam: General well-developed well-nourished female ambulates without antalgic gait.  Uses no assistive device to ambulate.  Is able to get on and off the exam table easily on her own. Bilateral hips: Good range of motion both hips without pain.  Left hip surgical incisions healing well.  No abnormal warmth erythema or signs of infection.  Impression: Status post left total hip arthroplasty 08/21/2022  Plan: We will see her back in 4 months at that time we will obtain an AP pelvis and lateral view of her left hip.  She will follow-up with [place] sooner if there is any questions concerns.  We will refill her Zanaflex.  In regards to dental procedures discussed with her she needed to either wait 3 months after surgery prior to having dental surgery or having antibiotic prophylaxis.

## 2022-10-18 ENCOUNTER — Other Ambulatory Visit: Payer: Self-pay | Admitting: Family Medicine

## 2022-10-18 DIAGNOSIS — Z1231 Encounter for screening mammogram for malignant neoplasm of breast: Secondary | ICD-10-CM

## 2022-10-31 ENCOUNTER — Ambulatory Visit: Payer: Medicare HMO | Admitting: Podiatry

## 2022-10-31 ENCOUNTER — Encounter: Payer: Self-pay | Admitting: Podiatry

## 2022-10-31 DIAGNOSIS — L600 Ingrowing nail: Secondary | ICD-10-CM | POA: Diagnosis not present

## 2022-10-31 DIAGNOSIS — L84 Corns and callosities: Secondary | ICD-10-CM

## 2022-10-31 MED ORDER — NEOMYCIN-POLYMYXIN-HC 3.5-10000-1 OT SUSP: OTIC | 0 refills | Status: DC

## 2022-10-31 NOTE — Patient Instructions (Signed)

## 2022-11-01 NOTE — Progress Notes (Signed)
  Subjective:  Patient ID: [name], adult    DOB: [birth year],  MRN: [id number]  Chief Complaint  Patient presents with   Nail Problem    NP L great toe ingrown nail , R pinkie toe has a wound    66 y.o. adult presents with the above complaint. History confirmed with patient.   Objective:  Physical Exam: warm, good capillary refill, no trophic changes or ulcerative lesions, normal DP and PT pulses, and normal sensory exam. Left Foot:  Ingrown hallux lateral border Right Foot:  Listers corn present on the lateral fifth toe  Assessment:   1. Ingrowing left great toenail   2. Corn of toe      Plan:  Patient was evaluated and treated and all questions answered.    Ingrown Nail, left -Patient elects to proceed with minor surgery to remove ingrown toenail today. Consent reviewed and signed by patient. -Ingrown nail excised. See procedure note. -Educated on post-procedure care including soaking. Written instructions provided and reviewed. -Rx for Cortisporin sent to pharmacy. -Advised on signs and symptoms of infection developing.  We discussed that the phenol likely will create some redness and edema and tenderness around the nailbed as long as it is localized this is to be expected.  Will return as needed if any infection signs develop  Procedure: Excision of Ingrown Toenail Location: Left 1st toe medial nail borders. Anesthesia: Lidocaine 1% plain; 1.5 mL and Marcaine 0.5% plain; 1.5 mL, digital block. Skin Prep: Betadine. Dressing: Silvadene; telfa; dry, sterile, compression dressing. Technique: Following skin prep, the toe was exsanguinated and a tourniquet was secured at the base of the toe. The affected nail border was freed, split with a nail splitter, and excised. Chemical matrixectomy was then performed with phenol and irrigated out with alcohol. The tourniquet was then removed and sterile dressing applied. Disposition: Patient tolerated procedure well.       We also discussed the etiology and treatment options of listers corns, we discussed offloading silicone pads which I dispensed.  I debrided the lesion as a courtesy today.  She will return as needed for this    Return in about 3 weeks (around 11/21/2022) for nail re-check.

## 2022-11-21 ENCOUNTER — Ambulatory Visit (INDEPENDENT_AMBULATORY_CARE_PROVIDER_SITE_OTHER): Payer: Medicare HMO | Admitting: Podiatry

## 2022-11-21 DIAGNOSIS — L600 Ingrowing nail: Secondary | ICD-10-CM

## 2022-11-21 NOTE — Progress Notes (Signed)
Patient seen today for nail check after ingrown toenail removal to left hallux. No sign of injection noted at this time. Denies any fever, nausea, vomiting, chills or chest pain at this time. Patient was advised to monitor area for signs of infection. Patient was advised to call the office with any concerns or questions.

## 2022-12-12 ENCOUNTER — Ambulatory Visit
Admission: RE | Admit: 2022-12-12 | Discharge: 2022-12-12 | Disposition: A | Discharge status: Home / Self Care | Payer: Medicare HMO | Source: Ambulatory Visit | Attending: Family Medicine | Admitting: Family Medicine

## 2022-12-12 DIAGNOSIS — Z1231 Encounter for screening mammogram for malignant neoplasm of breast: Secondary | ICD-10-CM

## 2023-02-04 ENCOUNTER — Ambulatory Visit: Payer: Medicare HMO | Admitting: Orthopaedic Surgery

## 2023-02-04 ENCOUNTER — Encounter: Payer: Self-pay | Admitting: Orthopaedic Surgery

## 2023-02-04 ENCOUNTER — Ambulatory Visit (INDEPENDENT_AMBULATORY_CARE_PROVIDER_SITE_OTHER): Payer: Medicare HMO

## 2023-02-04 DIAGNOSIS — M25511 Pain in right shoulder: Secondary | ICD-10-CM

## 2023-02-04 DIAGNOSIS — Z96642 Presence of left artificial hip joint: Secondary | ICD-10-CM | POA: Diagnosis not present

## 2023-02-04 DIAGNOSIS — G8929 Other chronic pain: Secondary | ICD-10-CM

## 2023-02-04 MED ORDER — MELOXICAM 15 MG PO TABS: 15.0000 mg | ORAL_TABLET | Freq: Every day | ORAL | 3 refills | Status: DC | PRN

## 2023-02-04 NOTE — Progress Notes (Signed)
The patient is now 6 months status post a left total hip arthroplasty.  She had a right hip replaced sometime ago by one of my colleagues in town and open reduction and fixation of a right proximal humerus fracture.  She is also had significant cervical spine surgery all done by different orthopedic surgeons.  She is someone who used to be in chronic pain management but she is out of chronic pain management due to use of marijuana.  She is not on anti-inflammatories now.  On examination her right and left hips move smoothly.  She has chronic pain with the right hip.  Her right shoulder moves smoothly.  She does have some soft tissue swelling just inferior and posterior to the incision on the left hip is the one that we were performed and this should hopefully subside with time.  She is concerned about it appropriately.  There is no redness.  There is no evidence infection.  This is consolidated tissue.  The right shoulder moves smoothly and fluidly.  She does not have terminal full forward flexion but she is almost completely full.  X-rays of the right shoulder show the fracture has healed completely and the hardware is intact.  There is no evidence of hardware failure and no glenohumeral arthritic changes.  An AP pelvis shows bilateral total hip arthroplasties with no complicating features.  I can start her on Mobic daily which can help with inflammation.  From my standpoint I will need to see her back for 6 months unless she is having any issues with her hip on the left side.  We can have a final standing AP pelvis at that visit.

## 2023-02-28 ENCOUNTER — Encounter: Payer: Self-pay | Admitting: Radiology

## 2023-03-08 ENCOUNTER — Encounter: Payer: Self-pay | Admitting: Podiatry

## 2023-03-08 ENCOUNTER — Ambulatory Visit: Payer: Medicare HMO | Admitting: Podiatry

## 2023-03-08 DIAGNOSIS — L84 Corns and callosities: Secondary | ICD-10-CM

## 2023-03-08 DIAGNOSIS — L600 Ingrowing nail: Secondary | ICD-10-CM

## 2023-03-08 NOTE — Patient Instructions (Signed)

## 2023-03-08 NOTE — Progress Notes (Signed)
Subjective:   Patient ID: [name], adult   DOB: 67 y.o.   MRN: [id number]   HPI Patient is concerned about ingrown toenail left big toe after having the lateral border removed about 4 months ago   ROS      Objective:  Physical Exam  Neurovascular status intact with incurvation of the lateral border left hallux localized with previous surgery with some crusted tissue formation     Assessment:  Overall the ingrown has healed excellent but there may be some slight tissue formation or structural nail deformity     Plan:  Reviewed with patient do not recommend aggressive treatment but I did debride out the lateral border and debrided some tissue which she stated gave her relief.  If we take more nail eventually may have to take the whole nail off would like not to do that unless we actually have to and patient will be seen back as needed

## 2023-05-23 ENCOUNTER — Other Ambulatory Visit: Payer: Self-pay | Admitting: Orthopedic Surgery

## 2023-05-23 DIAGNOSIS — M542 Cervicalgia: Secondary | ICD-10-CM

## 2023-06-01 ENCOUNTER — Ambulatory Visit
Admission: RE | Admit: 2023-06-01 | Discharge: 2023-06-01 | Disposition: A | Discharge status: Home / Self Care | Payer: Medicare HMO | Source: Ambulatory Visit | Attending: Orthopedic Surgery | Admitting: Orthopedic Surgery

## 2023-06-01 DIAGNOSIS — M542 Cervicalgia: Secondary | ICD-10-CM

## 2023-06-25 ENCOUNTER — Other Ambulatory Visit: Payer: Self-pay | Admitting: Orthopedic Surgery

## 2023-06-26 ENCOUNTER — Telehealth: Payer: Self-pay | Admitting: Orthopaedic Surgery

## 2023-06-26 NOTE — Telephone Encounter (Signed)
Pt called and made an upcoming appt with Dr Magnus Ivan. Pt states she dont mean any disrespect but she feels Dr Magnus Ivan did her surgery and made her pain worse. She is asking to see him so he can see what he did to her. Pt kept stating she is very unhappy with her surgery results. Pt states that Dr Magnus Ivan think all she wanted was pain pills but she dont take pills. I apologized for any way she may feel about her results and she can come to speak with Dr Magnus Ivan to see what we can do to make her feel better. Pt kept pressing the issue of how she dont need pain pills and Dr. Magnus Ivan missed her up for life. I made the appt and documented this conversation.

## 2023-07-17 ENCOUNTER — Ambulatory Visit: Payer: Medicare HMO | Admitting: Orthopaedic Surgery

## 2023-07-17 NOTE — Pre-Procedure Instructions (Signed)
Surgical Instructions   Your procedure is scheduled on Thursday, August 1st. Report to [facility] Main Entrance "A" at 07:00 A.M., then check in with the Admitting office. Any questions or running late day of surgery: call [phone]  Questions prior to your surgery date: call [phone], Monday-Friday, 8am-4pm. If you experience any cold or flu symptoms such as cough, fever, chills, shortness of breath, etc. between now and your scheduled surgery, please notify us at the above number.     Remember:  Do not eat after midnight the night before your surgery  You may drink clear liquids until 07:00 AM the morning of your surgery.   Clear liquids allowed are: Water, Non-Citrus Juices (without pulp), Carbonated Beverages, Clear Tea, Black Coffee Only (NO MILK, CREAM OR POWDERED CREAMER of any kind), and Gatorade.   Patient Instructions  The night before surgery:  No food after midnight. ONLY clear liquids after midnight  The day of surgery (if you do NOT have diabetes):  Drink ONE (1) Pre-Surgery Clear Ensure by 07:00 AM the morning of surgery. Drink in one sitting. Do not sip.  This drink was given to you during your hospital  pre-op appointment visit.  Nothing else to drink after completing the  Pre-Surgery Clear Ensure.          If you have questions, please contact your surgeon's office.     Take these medicines the morning of surgery with A SIP OF WATER  atorvastatin (LIPITOR)  gabapentin (NEURONTIN)  topiramate (TOPAMAX)     May take these medicines IF NEEDED: albuterol (VENTOLIN HFA)- bring inhaler with you on day of surgery Propylene Glycol (SYSTANE COMPLETE)  tiZANidine (ZANAFLEX)     One week prior to surgery, STOP taking any Aspirin (unless otherwise instructed by your surgeon) Aleve, Naproxen, Ibuprofen, Motrin, Advil, diclofenac Sodium (EQ ARTHRITIS PAIN) gel, Goody's, BC's, all herbal medications, fish oil, meloxicam (MOBIC), and non-prescription  vitamins.                     Do NOT Smoke (Tobacco/Vaping) for 24 hours prior to your procedure.  If you use a CPAP at night, you may bring your mask/headgear for your overnight stay.   You will be asked to remove any contacts, glasses, piercing's, hearing aid's, dentures/partials prior to surgery. Please bring cases for these items if needed.    Patients discharged the day of surgery will not be allowed to drive home, and someone needs to stay with them for 24 hours.  SURGICAL WAITING ROOM VISITATION Patients may have no more than 2 support people in the waiting area - these visitors may rotate.   Pre-op nurse will coordinate an appropriate time for 1 ADULT support person, who may not rotate, to accompany patient in pre-op.  Children under the age of 61 must have an adult with them who is not the patient and must remain in the main waiting area with an adult.  If the patient needs to stay at the hospital during part of their recovery, the visitor guidelines for inpatient rooms apply.  Please refer to the [facility] website for the visitor guidelines for any additional information.   If you received a COVID test during your pre-op visit  it is requested that you wear a mask when out in public, stay away from anyone that may not be feeling well and notify your surgeon if you develop symptoms. If you have been in contact with anyone that has tested positive in the  last 10 days please notify you surgeon.      Pre-operative 5 CHG Bathing Instructions   You can play a key role in reducing the risk of infection after surgery. Your skin needs to be as free of germs as possible. You can reduce the number of germs on your skin by washing with CHG (chlorhexidine gluconate) soap before surgery. CHG is an antiseptic soap that kills germs and continues to kill germs even after washing.   DO NOT use if you have an allergy to chlorhexidine/CHG or antibacterial soaps. If your skin becomes reddened or  irritated, stop using the CHG and notify one of our RNs at [phone].   Please shower with the CHG soap starting 4 days before surgery using the following schedule:     Please keep in mind the following:  DO NOT shave, including legs and underarms, starting the day of your first shower.   You may shave your face at any point before/day of surgery.  Place clean sheets on your bed the day you start using CHG soap. Use a clean washcloth (not used since being washed) for each shower. DO NOT sleep with pets once you start using the CHG.   CHG Shower Instructions:  If you choose to wash your hair and private area, wash first with your normal shampoo/soap.  After you use shampoo/soap, rinse your hair and body thoroughly to remove shampoo/soap residue.  Turn the water OFF and apply about 3 tablespoons (45 ml) of CHG soap to a CLEAN washcloth.  Apply CHG soap ONLY FROM YOUR NECK DOWN TO YOUR TOES (washing for 3-5 minutes)  DO NOT use CHG soap on face, private areas, open wounds, or sores.  Pay special attention to the area where your surgery is being performed.  If you are having back surgery, having someone wash your back for you may be helpful. Wait 2 minutes after CHG soap is applied, then you may rinse off the CHG soap.  Pat dry with a clean towel  Put on clean clothes/pajamas   If you choose to wear lotion, please use ONLY the CHG-compatible lotions on the back of this paper.   Additional instructions for the day of surgery: DO NOT APPLY any lotions, deodorants, cologne, or perfumes.   Do not bring valuables to the hospital. [facility] is not responsible for any belongings/valuables. Do not wear nail polish, gel polish, artificial nails, or any other type of covering on natural nails (fingers and toes) Do not wear jewelry or makeup Put on clean/comfortable clothes.  Please brush your teeth.  Ask your nurse before applying any prescription medications to the skin.     CHG  Compatible Lotions   Aveeno Moisturizing lotion  Cetaphil Moisturizing Cream  Cetaphil Moisturizing Lotion  Clairol Herbal Essence Moisturizing Lotion, Dry Skin  Clairol Herbal Essence Moisturizing Lotion, Extra Dry Skin  Clairol Herbal Essence Moisturizing Lotion, Normal Skin  Curel Age Defying Therapeutic Moisturizing Lotion with Alpha Hydroxy  Curel Extreme Care Body Lotion  Curel Soothing Hands Moisturizing Hand Lotion  Curel Therapeutic Moisturizing Cream, Fragrance-Free  Curel Therapeutic Moisturizing Lotion, Fragrance-Free  Curel Therapeutic Moisturizing Lotion, Original Formula  Eucerin Daily Replenishing Lotion  Eucerin Dry Skin Therapy Plus Alpha Hydroxy Crme  Eucerin Dry Skin Therapy Plus Alpha Hydroxy Lotion  Eucerin Original Crme  Eucerin Original Lotion  Eucerin Plus Crme Eucerin Plus Lotion  Eucerin TriLipid Replenishing Lotion  Keri Anti-Bacterial Hand Lotion  Keri Deep Conditioning Original Lotion Dry Skin Formula Softly Scented  Keri Deep Conditioning Original Lotion, Fragrance Free Sensitive Skin Formula  Keri Lotion Fast Absorbing Fragrance Free Sensitive Skin Formula  Keri Lotion Fast Absorbing Softly Scented Dry Skin Formula  Keri Original Lotion  Keri Skin Renewal Lotion Keri Silky Smooth Lotion  Keri Silky Smooth Sensitive Skin Lotion  Nivea Body Creamy Conditioning Oil  Nivea Body Extra Enriched Lotion  Nivea Body Original Lotion  Nivea Body Sheer Moisturizing Lotion Nivea Crme  Nivea Skin Firming Lotion  NutraDerm 30 Skin Lotion  NutraDerm Skin Lotion  NutraDerm Therapeutic Skin Cream  NutraDerm Therapeutic Skin Lotion  ProShield Protective Hand Cream  Provon moisturizing lotion  Please read over the following fact sheets that you were given.

## 2023-07-18 ENCOUNTER — Encounter (HOSPITAL_COMMUNITY): Payer: Self-pay

## 2023-07-18 ENCOUNTER — Encounter (HOSPITAL_COMMUNITY)
Admission: RE | Admit: 2023-07-18 | Discharge: 2023-07-18 | Disposition: A | Discharge status: Home / Self Care | Payer: Medicare HMO | Source: Ambulatory Visit | Attending: Orthopedic Surgery | Admitting: Orthopedic Surgery

## 2023-07-18 ENCOUNTER — Other Ambulatory Visit: Payer: Self-pay

## 2023-07-18 VITALS — BP 107/55 | HR 64 | Temp 98.2°F | Resp 18 | Ht <= 58 in

## 2023-07-18 DIAGNOSIS — Z01818 Encounter for other preprocedural examination: Secondary | ICD-10-CM

## 2023-07-18 DIAGNOSIS — Z01812 Encounter for preprocedural laboratory examination: Secondary | ICD-10-CM | POA: Insufficient documentation

## 2023-07-18 LAB — BASIC METABOLIC PANEL
Anion gap: 8 (ref 5–15)
BUN: 19 mg/dL (ref 8–23)
CO2: 22 mmol/L (ref 22–32)
Calcium: 9.6 mg/dL (ref 8.9–10.3)
Chloride: 113 mmol/L — ABNORMAL HIGH (ref 98–111)
Creatinine, Ser: 1.14 mg/dL — ABNORMAL HIGH (ref 0.44–1.00)
GFR, Estimated: 52 mL/min — ABNORMAL LOW (ref >60)
Glucose, Bld: 102 mg/dL — ABNORMAL HIGH (ref 70–99)
Potassium: 4.5 mmol/L (ref 3.5–5.1)
Sodium: 143 mmol/L (ref 135–145)

## 2023-07-18 LAB — CBC
HCT: 40.2 % (ref 36.0–46.0)
Hemoglobin: 12.5 g/dL (ref 12.0–15.0)
MCH: 31.6 pg (ref 26.0–34.0)
MCHC: 31.1 g/dL (ref 30.0–36.0)
MCV: 101.5 fL — ABNORMAL HIGH (ref 80.0–100.0)
Platelets: 174 10*3/uL (ref 150–400)
RBC: 3.96 MIL/uL (ref 3.87–5.11)
RDW: 16.4 % — ABNORMAL HIGH (ref 11.5–15.5)
WBC: 6.9 10*3/uL (ref 4.0–10.5)
nRBC: 0 % (ref 0.0–0.2)

## 2023-07-18 LAB — SURGICAL PCR SCREEN
MRSA, PCR: NEGATIVE
Staphylococcus aureus: NEGATIVE

## 2023-07-18 NOTE — Progress Notes (Signed)
PCP - Sharon Seller Cardiologist - Denies  PPM/ICD - Denies Device Orders - N/A Rep Notified - N/A  Chest x-ray - 07-13-14 EKG - 08-24-2020 (In [facility]) Stress Test -Denies ECHO - Denies Cardiac Cath - Denies  Sleep Study - Denies CPAP - N/A  DM Denies  Blood Thinner Instructions: N/A Aspirin Instructions:N/A  ERAS Protcol - yes with drink PRE-SURGERY Ensure   COVID TEST- N/A  Anesthesia review: No  Patient denies shortness of breath, fever, cough and chest pain at PAT appointment   All instructions explained to the patient, with a verbal understanding of the material. Patient agrees to go over the instructions while at home for a better understanding. Patient also instructed to self quarantine after being tested for COVID-19. The opportunity to ask questions was provided.

## 2023-07-18 NOTE — Progress Notes (Addendum)
Pt was asked when she last had an EKG. She was told the last one that was seen in the computer was from 2021 at [place]. She said she "was NOT at [facility]" and got very upset and walked over to my computer and looked at my computer screen. The diagnosis said "suicidal ideation" for that encounter. She then got tearful, balled her fists up, got angry, and said "I admitted myself. I never had suicidal thoughts. I took myself off my own medicine." She got up and got loud. She was told to sit down and calm down. I told her I had nothing to do with that encounter in 2021. PAT appt was finished and pt did calm down.

## 2023-07-25 ENCOUNTER — Ambulatory Visit (HOSPITAL_COMMUNITY): Payer: Medicare HMO | Admitting: Certified Registered Nurse Anesthetist

## 2023-07-25 ENCOUNTER — Encounter (HOSPITAL_COMMUNITY)
Admission: RE | Disposition: A | Discharge status: Home / Self Care | Payer: Self-pay | Source: Home / Self Care | Attending: Orthopedic Surgery

## 2023-07-25 ENCOUNTER — Ambulatory Visit (HOSPITAL_COMMUNITY): Payer: Medicare HMO

## 2023-07-25 ENCOUNTER — Encounter (HOSPITAL_COMMUNITY): Payer: Self-pay | Admitting: Orthopedic Surgery

## 2023-07-25 ENCOUNTER — Ambulatory Visit (HOSPITAL_BASED_OUTPATIENT_CLINIC_OR_DEPARTMENT_OTHER): Payer: Medicare HMO | Admitting: Certified Registered Nurse Anesthetist

## 2023-07-25 ENCOUNTER — Ambulatory Visit (HOSPITAL_COMMUNITY)
Admission: RE | Admit: 2023-07-25 | Discharge: 2023-07-25 | Disposition: A | Discharge status: Home / Self Care | Payer: Medicare HMO | Source: Home / Self Care | Attending: Orthopedic Surgery | Admitting: Orthopedic Surgery

## 2023-07-25 ENCOUNTER — Other Ambulatory Visit: Payer: Self-pay

## 2023-07-25 DIAGNOSIS — G549 Nerve root and plexus disorder, unspecified: Secondary | ICD-10-CM

## 2023-07-25 DIAGNOSIS — M1990 Unspecified osteoarthritis, unspecified site: Secondary | ICD-10-CM | POA: Insufficient documentation

## 2023-07-25 DIAGNOSIS — Z87891 Personal history of nicotine dependence: Secondary | ICD-10-CM | POA: Insufficient documentation

## 2023-07-25 DIAGNOSIS — M5001 Cervical disc disorder with myelopathy,  high cervical region: Secondary | ICD-10-CM

## 2023-07-25 DIAGNOSIS — Z981 Arthrodesis status: Secondary | ICD-10-CM | POA: Insufficient documentation

## 2023-07-25 DIAGNOSIS — M4802 Spinal stenosis, cervical region: Secondary | ICD-10-CM | POA: Insufficient documentation

## 2023-07-25 DIAGNOSIS — G9520 Unspecified cord compression: Secondary | ICD-10-CM | POA: Diagnosis not present

## 2023-07-25 DIAGNOSIS — K219 Gastro-esophageal reflux disease without esophagitis: Secondary | ICD-10-CM | POA: Diagnosis not present

## 2023-07-25 HISTORY — PX: ANTERIOR CERVICAL DECOMP/DISCECTOMY FUSION: SHX1161

## 2023-07-25 SURGERY — ANTERIOR CERVICAL DECOMPRESSION/DISCECTOMY FUSION 1 LEVEL
Anesthesia: General | Site: Spine Cervical

## 2023-07-25 MED ORDER — FENTANYL CITRATE (PF) 250 MCG/5ML IJ SOLN
INTRAMUSCULAR | Status: DC | PRN
  Administered 2023-07-25 (×2): 250 ug via INTRAVENOUS

## 2023-07-25 MED ORDER — ONDANSETRON HCL 4 MG/2ML IJ SOLN
INTRAMUSCULAR | Status: DC | PRN
  Administered 2023-07-25: 4 mg via INTRAVENOUS

## 2023-07-25 MED ORDER — ACETAMINOPHEN 500 MG PO TABS
ORAL_TABLET | ORAL | Status: AC
  Administered 2023-07-25: 1000 mg via ORAL
  Filled 2023-07-25: qty 2

## 2023-07-25 MED ORDER — ORAL CARE MOUTH RINSE: 15.0000 mL | Freq: Once | OROMUCOSAL | Status: AC

## 2023-07-25 MED ORDER — HYDROMORPHONE HCL 1 MG/ML IJ SOLN: 0.2500 mg | INTRAMUSCULAR | Status: DC | PRN

## 2023-07-25 MED ORDER — ONDANSETRON HCL 4 MG/2ML IJ SOLN
INTRAMUSCULAR | Status: AC
  Filled 2023-07-25: qty 2

## 2023-07-25 MED ORDER — FENTANYL CITRATE (PF) 250 MCG/5ML IJ SOLN
INTRAMUSCULAR | Status: AC
  Filled 2023-07-25: qty 5

## 2023-07-25 MED ORDER — LIDOCAINE 2% (20 MG/ML) 5 ML SYRINGE
INTRAMUSCULAR | Status: AC
  Filled 2023-07-25: qty 5

## 2023-07-25 MED ORDER — THROMBIN 20000 UNITS EX KIT
PACK | CUTANEOUS | Status: DC | PRN
  Administered 2023-07-25: 20 mL via TOPICAL

## 2023-07-25 MED ORDER — SUGAMMADEX SODIUM 200 MG/2ML IV SOLN
INTRAVENOUS | Status: DC | PRN
  Administered 2023-07-25: 200 mg via INTRAVENOUS

## 2023-07-25 MED ORDER — LIDOCAINE 2% (20 MG/ML) 5 ML SYRINGE
INTRAMUSCULAR | Status: DC | PRN
  Administered 2023-07-25: 40 mg via INTRAVENOUS

## 2023-07-25 MED ORDER — 0.9 % SODIUM CHLORIDE (POUR BTL) OPTIME
TOPICAL | Status: DC | PRN
  Administered 2023-07-25: 1000 mL

## 2023-07-25 MED ORDER — BUPIVACAINE-EPINEPHRINE 0.25% -1:200000 IJ SOLN
INTRAMUSCULAR | Status: DC | PRN
  Administered 2023-07-25: 5 mL

## 2023-07-25 MED ORDER — PROPOFOL 10 MG/ML IV BOLUS
INTRAVENOUS | Status: AC
  Filled 2023-07-25: qty 20

## 2023-07-25 MED ORDER — ACETAMINOPHEN 500 MG PO TABS: 1000.0000 mg | ORAL_TABLET | Freq: Once | ORAL | Status: AC

## 2023-07-25 MED ORDER — MINERAL OIL LIGHT 100 % EX OIL
TOPICAL_OIL | CUTANEOUS | Status: DC | PRN
Start: 2023-07-25 — End: 2023-07-25
  Administered 2023-07-25: 1 via TOPICAL

## 2023-07-25 MED ORDER — CHLORHEXIDINE GLUCONATE 0.12 % MT SOLN: 15.0000 mL | Freq: Once | OROMUCOSAL | Status: AC

## 2023-07-25 MED ORDER — PHENYLEPHRINE HCL (PRESSORS) 10 MG/ML IV SOLN
INTRAVENOUS | Status: DC | PRN
  Administered 2023-07-25: 80 ug via INTRAVENOUS
  Administered 2023-07-25: 160 ug via INTRAVENOUS

## 2023-07-25 MED ORDER — THROMBIN 20000 UNITS EX SOLR
CUTANEOUS | Status: AC
  Filled 2023-07-25: qty 20000

## 2023-07-25 MED ORDER — CEFAZOLIN SODIUM-DEXTROSE 2-4 GM/100ML-% IV SOLN
2.0000 g | INTRAVENOUS | Status: AC
  Administered 2023-07-25: 2 g via INTRAVENOUS

## 2023-07-25 MED ORDER — LACTATED RINGERS IV SOLN: INTRAVENOUS | Status: DC

## 2023-07-25 MED ORDER — KETAMINE HCL 50 MG/5ML IJ SOSY
PREFILLED_SYRINGE | INTRAMUSCULAR | Status: AC
  Filled 2023-07-25: qty 5

## 2023-07-25 MED ORDER — PROPOFOL 500 MG/50ML IV EMUL
INTRAVENOUS | Status: DC | PRN
  Administered 2023-07-25: 100 ug/kg/min via INTRAVENOUS

## 2023-07-25 MED ORDER — PHENYLEPHRINE HCL-NACL 20-0.9 MG/250ML-% IV SOLN
INTRAVENOUS | Status: DC | PRN
  Administered 2023-07-25: 30 ug/min via INTRAVENOUS

## 2023-07-25 MED ORDER — PROPOFOL 10 MG/ML IV BOLUS
INTRAVENOUS | Status: DC | PRN
  Administered 2023-07-25: 100 mg via INTRAVENOUS

## 2023-07-25 MED ORDER — MIDAZOLAM HCL 2 MG/2ML IJ SOLN
INTRAMUSCULAR | Status: AC
  Filled 2023-07-25: qty 2

## 2023-07-25 MED ORDER — ROCURONIUM BROMIDE 10 MG/ML (PF) SYRINGE
PREFILLED_SYRINGE | INTRAVENOUS | Status: AC
  Filled 2023-07-25: qty 10

## 2023-07-25 MED ORDER — LACTATED RINGERS IV SOLN: INTRAVENOUS | Status: DC | PRN

## 2023-07-25 MED ORDER — DEXAMETHASONE SODIUM PHOSPHATE 10 MG/ML IJ SOLN
INTRAMUSCULAR | Status: AC
  Filled 2023-07-25: qty 1

## 2023-07-25 MED ORDER — CHLORHEXIDINE GLUCONATE 0.12 % MT SOLN
OROMUCOSAL | Status: AC
  Administered 2023-07-25: 15 mL via OROMUCOSAL
  Filled 2023-07-25: qty 15

## 2023-07-25 MED ORDER — CELECOXIB 200 MG PO CAPS: 200.0000 mg | ORAL_CAPSULE | Freq: Once | ORAL | Status: DC

## 2023-07-25 MED ORDER — POVIDONE-IODINE 7.5 % EX SOLN
Freq: Once | CUTANEOUS | Status: DC
  Filled 2023-07-25: qty 118

## 2023-07-25 MED ORDER — CELECOXIB 200 MG PO CAPS
ORAL_CAPSULE | ORAL | Status: AC
  Filled 2023-07-25: qty 1

## 2023-07-25 MED ORDER — ROCURONIUM BROMIDE 10 MG/ML (PF) SYRINGE
PREFILLED_SYRINGE | INTRAVENOUS | Status: DC | PRN
  Administered 2023-07-25: 70 mg via INTRAVENOUS
  Administered 2023-07-25: 30 mg via INTRAVENOUS

## 2023-07-25 MED ORDER — BUPIVACAINE-EPINEPHRINE (PF) 0.25% -1:200000 IJ SOLN
INTRAMUSCULAR | Status: AC
  Filled 2023-07-25: qty 30

## 2023-07-25 MED ORDER — CEFAZOLIN SODIUM-DEXTROSE 2-4 GM/100ML-% IV SOLN
INTRAVENOUS | Status: AC
  Filled 2023-07-25: qty 100

## 2023-07-25 SURGICAL SUPPLY — 75 items
AGENT HMST KT MTR STRL THRMB (HEMOSTASIS)
APL SKNCLS STERI-STRIP NONHPOA (GAUZE/BANDAGES/DRESSINGS) ×1
BAG COUNTER SPONGE SURGICOUNT (BAG) ×1 IMPLANT
BAG SPNG CNTER NS LX DISP (BAG) ×1
BENZOIN TINCTURE PRP APPL 2/3 (GAUZE/BANDAGES/DRESSINGS) ×1 IMPLANT
BIT DRILL 13 (BIT) IMPLANT
BIT DRILL NEURO 2X3.1 SFT TUCH (MISCELLANEOUS) ×1 IMPLANT
BLADE CLIPPER SURG (BLADE) ×1 IMPLANT
BLADE SURG 15 STRL LF DISP TIS (BLADE) ×1 IMPLANT
BLADE SURG 15 STRL SS (BLADE) ×1
BONE VIVIGEN FORMABLE 1.3CC (Bone Implant) ×1 IMPLANT
CAGE LORDOTIC 6 SM (Cage) IMPLANT
CORD BIPOLAR FORCEPS 12FT (ELECTRODE) ×1 IMPLANT
COVER SURGICAL LIGHT HANDLE (MISCELLANEOUS) ×1 IMPLANT
DRAIN JACKSON RD 7FR 3/32 (WOUND CARE) IMPLANT
DRAPE C-ARM 42X72 X-RAY (DRAPES) ×1 IMPLANT
DRAPE POUCH INSTRU U-SHP 10X18 (DRAPES) ×1 IMPLANT
DRAPE SURG 17X23 STRL (DRAPES) ×3 IMPLANT
DRILL NEURO 2X3.1 SOFT TOUCH (MISCELLANEOUS) ×1
DURAPREP 26ML APPLICATOR (WOUND CARE) ×1 IMPLANT
ELECT COATED BLADE 2.86 ST (ELECTRODE) ×1 IMPLANT
ELECT PENCIL ROCKER SW 15FT (MISCELLANEOUS) IMPLANT
ELECT REM PT RETURN 9FT ADLT (ELECTROSURGICAL) ×1
ELECTRODE REM PT RTRN 9FT ADLT (ELECTROSURGICAL) ×1 IMPLANT
EVACUATOR SILICONE 100CC (DRAIN) IMPLANT
GAUZE 4X4 16PLY ~~LOC~~+RFID DBL (SPONGE) ×1 IMPLANT
GAUZE SPONGE 4X4 12PLY STRL (GAUZE/BANDAGES/DRESSINGS) ×1 IMPLANT
GLOVE BIO SURGEON STRL SZ 6.5 (GLOVE) ×1 IMPLANT
GLOVE BIO SURGEON STRL SZ8 (GLOVE) ×1 IMPLANT
GLOVE BIOGEL PI IND STRL 7.0 (GLOVE) ×2 IMPLANT
GLOVE BIOGEL PI IND STRL 8 (GLOVE) ×1 IMPLANT
GLOVE SURG ENC MOIS LTX SZ6.5 (GLOVE) ×1 IMPLANT
GOWN STRL REUS W/ TWL LRG LVL3 (GOWN DISPOSABLE) ×1 IMPLANT
GOWN STRL REUS W/ TWL XL LVL3 (GOWN DISPOSABLE) ×1 IMPLANT
GOWN STRL REUS W/TWL LRG LVL3 (GOWN DISPOSABLE) ×1
GOWN STRL REUS W/TWL XL LVL3 (GOWN DISPOSABLE) ×1
GRAFT BNE MATRIX VG FRMBL SM 1 (Bone Implant) IMPLANT
IV CATH 14GX2 1/4 (CATHETERS) ×1 IMPLANT
KIT BASIN OR (CUSTOM PROCEDURE TRAY) ×1 IMPLANT
KIT TURNOVER KIT B (KITS) ×1 IMPLANT
MANIFOLD NEPTUNE II (INSTRUMENTS) ×1 IMPLANT
NDL PRECISIONGLIDE 27X1.5 (NEEDLE) ×1 IMPLANT
NDL SPNL 20GX3.5 QUINCKE YW (NEEDLE) ×1 IMPLANT
NEEDLE PRECISIONGLIDE 27X1.5 (NEEDLE) ×1
NEEDLE SPNL 20GX3.5 QUINCKE YW (NEEDLE) ×1
NS IRRIG 1000ML POUR BTL (IV SOLUTION) ×1 IMPLANT
PACK ORTHO CERVICAL (CUSTOM PROCEDURE TRAY) ×1 IMPLANT
PAD ARMBOARD 7.5X6 YLW CONV (MISCELLANEOUS) ×2 IMPLANT
PATTIES SURGICAL .5 X.5 (GAUZE/BANDAGES/DRESSINGS) IMPLANT
PATTIES SURGICAL .5 X1 (DISPOSABLE) IMPLANT
PIN DISTRACTION 14 (PIN) IMPLANT
PLATE ELITE 21MM (Plate) IMPLANT
POSITIONER HEAD DONUT 9IN (MISCELLANEOUS) ×1 IMPLANT
RASP 3.0MM (RASP) IMPLANT
SCREW SELF TAP VAR 4.0X13 (Screw) IMPLANT
SCREW ST 13X4XST VA NS SPNE (Screw) IMPLANT
SCREW ST VAR 4 ATL (Screw) ×2 IMPLANT
SPONGE INTESTINAL PEANUT (DISPOSABLE) ×1 IMPLANT
SPONGE SURGIFOAM ABS GEL 100 (HEMOSTASIS) IMPLANT
STRIP CLOSURE SKIN 1/2X4 (GAUZE/BANDAGES/DRESSINGS) ×1 IMPLANT
SURGIFLO W/THROMBIN 8M KIT (HEMOSTASIS) IMPLANT
SUT MNCRL AB 4-0 PS2 18 (SUTURE) ×1 IMPLANT
SUT SILK 4 0 (SUTURE)
SUT SILK 4-0 18XBRD TIE 12 (SUTURE) IMPLANT
SUT VIC AB 2-0 CT2 18 VCP726D (SUTURE) ×1 IMPLANT
SYR BULB IRRIG 60ML STRL (SYRINGE) ×1 IMPLANT
SYR CONTROL 10ML LL (SYRINGE) ×2 IMPLANT
SYR TB 1ML LUER SLIP (SYRINGE) IMPLANT
TAPE CLOTH 4X10 WHT NS (GAUZE/BANDAGES/DRESSINGS) ×1 IMPLANT
TAPE CLOTH SOFT 2X10 (GAUZE/BANDAGES/DRESSINGS) IMPLANT
TAPE UMBILICAL 1/8X30 (MISCELLANEOUS) ×2 IMPLANT
TOWEL GREEN STERILE (TOWEL DISPOSABLE) ×1 IMPLANT
TOWEL GREEN STERILE FF (TOWEL DISPOSABLE) ×1 IMPLANT
WATER STERILE IRR 1000ML POUR (IV SOLUTION) ×1 IMPLANT
YANKAUER SUCT BULB TIP NO VENT (SUCTIONS) ×1 IMPLANT

## 2023-07-25 NOTE — Op Note (Addendum)
PATIENT NAME: [name] [facility]   MEDICAL RECORD NO.:   [id number]    DATE OF BIRTH: 06/15/1956   DATE OF PROCEDURE: 07/25/2023                               OPERATIVE REPORT     PREOPERATIVE DIAGNOSES: 1.  Cervical myelopathy (M50.01) 2.  Spinals stenosis, including spinal cord compression, C3-4 (M48.02) 3.  Status post previous ACDF spanning C4-C7, with instrumentation   POSTOPERATIVE DIAGNOSES: 1.  Cervical myelopathy (M50.01) 2.  Spinals stenosis, including spinal cord compression, C3-4 (M48.02) 3.  Status post previous ACDF spanning C4-C7, with instrumentation   PROCEDURE: 1. Anterior cervical decompression and fusion C3/4 2. Placement of anterior instrumentation, C3/4 (Atlantis Elite plate and screws) 3. Insertion of interbody device x1 (7mm Titan intervertebral spacer). 4. Intraoperative use of fluoroscopy. 5. Use of morselized allograft - DBX mix   SURGEON:  Estill Bamberg, MD   ASSISTANT:  Jason Coop, PA-C.   ANESTHESIA:  General endotracheal anesthesia.   COMPLICATIONS:  None.   DISPOSITION:  Stable.   ESTIMATED BLOOD LOSS:  Minimal.   INDICATIONS FOR SURGERY:  Briefly, [name]  is a pleasant 67 -year- old female, who did present to me with progressive cervical myelopathy.   [name] MRI did reveal the findings noted above.  Her MRI was reviewed, and given the natural history of cervical myelopathy, we did discuss proceeding with the surgery noted above.  The patient was fully aware of the risks and limitations of surgery as outlined in my preoperative note.   OPERATIVE DETAILS:  On 07/25/2023, the patient was brought to surgery and general endotracheal anesthesia was administered.  The patient was placed supine on the hospital bed. The neck was gently extended.  All bony prominences were meticulously padded.  The neck was prepped and draped in the usual sterile fashion.  At this point, I did make a left-sided transverse incision.  The platysma was  incised.  A Smith-Robinson approach was used and the anterior spine was identified. A self-retaining retractor was placed.  I then subperiosteally exposed the vertebral bodies from C3-C4.  The previously placed anterior cervical plate was noted. Using a metal cutting bur, I did cut through the plate at the level of the C4-5 intervertebral space. The upper aspect of the plate, and the screws into the C4 vertebral bodies, were removed. I then turned my attention to the C3-4 level. Caspar pins were then placed into the C3 and C4 vertebral bodies and distraction was applied.  A thorough and complete C3-4 intervertebral diskectomy was performed.  The posterior longitudinal ligament was identified and entered using a nerve hook.  I then used #1 followed by #2 Kerrison to perform a thorough and complete intervertebral diskectomy.  The spinal canal was thoroughly decompressed.  The endplates were then prepared and the appropriate-sized intervertebral spacer was then packed with DBX mix and tamped into position in the usual fashion. The Caspar pins  then were removed and bone wax was placed in their place.  The appropriate-sized anterior cervical plate was placed over the anterior spine.  13 mm variable angle screws were placed, 2 in each vertebral body from C3-C4 for a total of 4 vertebral body screws.  The screws were then locked to the plate using the Cam locking mechanism.  I was very pleased with the final fluoroscopic images.  The wound was then irrigated.  The wound was  then explored for any undue bleeding and there was no bleeding noted. The wound was then closed in layers using 2-0 Vicryl, followed by 4-0 Monocryl.  Benzoin and Steri-Strips were applied, followed by sterile dressing.  All instrument counts were correct at the termination of the procedure.   Of note, Jason Coop, PA-C, was my assistant throughout surgery, and did aid in retraction, placement of the hardware, suctioning,  and closure from start to finish.     Estill Bamberg, MD

## 2023-07-25 NOTE — Anesthesia Procedure Notes (Signed)
Procedure Name: Intubation Date/Time: 07/25/2023 10:50 AM  Performed by: Darryl Nestle, CRNAPre-anesthesia Checklist: Patient identified, Emergency Drugs available, Suction available and Patient being monitored Patient Re-evaluated:Patient Re-evaluated prior to induction Oxygen Delivery Method: Circle system utilized Preoxygenation: Pre-oxygenation with 100% oxygen Induction Type: IV induction Ventilation: Mask ventilation without difficulty Laryngoscope Size: Glidescope and 3 Grade View: Grade I Tube type: Oral Tube size: 7.0 mm Number of attempts: 1 Airway Equipment and Method: Stylet and Oral airway Placement Confirmation: ETT inserted through vocal cords under direct vision, positive ETCO2 and breath sounds checked- equal and bilateral Secured at: 20 cm Tube secured with: Tape Dental Injury: Teeth and Oropharynx as per pre-operative assessment

## 2023-07-25 NOTE — H&P (Signed)
PREOPERATIVE H&P  Chief Complaint: Bilateral hand and leg tingling and numbness   HPI: [name] is a 67 y.o. adult who presents with ongoing symptoms consistent with cervical myelopathy  MRI reveals spinal cord compression at C3-4, the level above her previous fusion  Patient has failed multiple forms of conservative care and continues to have pain (see office notes for additional details regarding the patient's full course of treatment)  Past Medical History:  Diagnosis Date   ADHD    Anxiety    Arthritis    Carpal tunnel syndrome of left wrist    Complication of anesthesia    states she had to much and once adn ended up on o2, no problems since   Contracture of muscle of right upper arm    limited rom   Cubital tunnel syndrome on left    Depression    Dyspnea    with activity   GERD (gastroesophageal reflux disease)    History of kidney stones    History of suicide attempt 2012--  XANAX OVERDOSE   Hypotension    reports that her SBP can run < 90   Migraine    Neuropathy of both feet    Personality disorder (HCC)    Proximal humerus fracture    right   UTI (urinary tract infection)    on cipro   Vitamin B12 deficiency 03/26/2018   Past Surgical History:  Procedure Laterality Date   ANTERIOR CERVICAL DECOMPRESSION/DISCECTOMY FUSION 4 LEVELS N/A 07/19/2021   Procedure: ANTERIOR CERVICAL DECOMPRESSION FUSION CERVICAL 4- CERVICAL 5, CERVICAL 5- CERVICAL 6, CERVICAL 6- CERVICAL 7 WITH INSTRUMENTATION AND ALLOGRAFT;  Surgeon: Estill Bamberg, MD;  Location: [facility];  Service: Orthopedics;  Laterality: N/A;   BACK SURGERY     CARPAL TUNNEL RELEASE Left 05/13/2013   Procedure: CARPAL TUNNEL RELEASE ENDOSCOPIC;  Surgeon: Jodi Marble, MD;  Location: [facility];  Service: Orthopedics;  Laterality: Left;  Incision at 0955.    CERVICAL BIOPSY  W/ LOOP ELECTRODE EXCISION  12/01/2008   CIN II   COLONOSCOPY  2015   EYE SURGERY Bilateral 2019    cataracts   FEMORAL HERNIA REPAIR Left 10/31/2009   AND INGUINAL LYMPH NODE BX   LUMBAR DISC SURGERY  X3   LAST ONE 1989   FUSION   NERVE REPAIR Left 05/13/2013   Procedure: ULNAR NEUROPLASTY AT ELBOW;  Surgeon: Jodi Marble, MD;  Location: [facility];  Service: Orthopedics;  Laterality: Left;   ORIF HUMERUS FRACTURE Right 18-Sep-2019   Procedure: OPEN REDUCTION INTERNAL FIXATION (ORIF) HUMERAL FRACTURE;  Surgeon: Sheral Apley, MD;  Location: [facility];  Service: Orthopedics;  Laterality: Right;   REPAIR PARTIAL FINGER AMPUTATION  1989   TONSILLECTOMY     TOTAL HIP ARTHROPLASTY Right 10/23/2016   TOTAL HIP ARTHROPLASTY Right 10/23/2016   Procedure: TOTAL HIP ARTHROPLASTY ANTERIOR APPROACH;  Surgeon: Sheral Apley, MD;  Location: [facility];  Service: Orthopedics;  Laterality: Right;   TOTAL HIP ARTHROPLASTY Left 08/21/2022   Procedure: LEFT TOTAL HIP ARTHROPLASTY ANTERIOR APPROACH;  Surgeon: Kathryne Hitch, MD;  Location: [facility];  Service: Orthopedics;  Laterality: Left;   Social History   Socioeconomic History   Marital status: Divorced    Spouse name: Not on file   Number of children: Not on file   Years of education: Not on file   Highest education level: Not on file  Occupational History  Not on file  Tobacco Use   Smoking status: Former   Smokeless tobacco: Never   Tobacco comments:    quit smoking 25 years ago  Vaping Use   Vaping status: Never Used  Substance and Sexual Activity   Alcohol use: No   Drug use: Yes    Frequency: 7.0 times per week    Types: Marijuana   Sexual activity: Not on file  Other Topics Concern   Not on file  Social History Narrative   Lives alone   Caffeine use: sometimes soda   Right handed    Social Determinants of Health   Financial Resource Strain: Medium Risk (08/25/2020)   Received from [facility], [facility]   Overall Financial Resource Strain (CARDIA)    Difficulty of Paying  Living Expenses: Somewhat hard  Food Insecurity: Not on file  Transportation Needs: Not on file  Physical Activity: Not on file  Stress: No Stress Concern Present (08/25/2020)   Received from [facility], [facility]   [employer] [facility] - Occupational Stress Questionnaire    Feeling of Stress : Not at all  Social Connections: Unknown (05/07/2022)   Received from [facility], [facility]   Social Network    Social Network: Not on file   Family History  Problem Relation Age of Onset   Breast cancer Mother    Allergies  Allergen Reactions   Benzodiazepines Other (See Comments)    Pt preference due to withdrawals    Carbamazepine Rash   Vortioxetine Rash and Other (See Comments)    headache   Citalopram     All SSRI's give her fever, insomnia   Cyclobenzaprine     Other reaction(s): urinary retention   Fluticasone     Other reaction(s): nervous   Other     All antibiotics give her a yeast infection   Zocor [Simvastatin] Anxiety    nervous   Prior to Admission medications   Medication Sig Start Date End Date Taking? Authorizing Provider  acetaminophen (TYLENOL) 500 MG tablet Take 1,000 mg by mouth at bedtime.   Yes [provider]  albuterol (VENTOLIN HFA) 108 (90 Base) MCG/ACT inhaler Inhale 1 puff into the lungs every 6 (six) hours as needed for wheezing or shortness of breath.   Yes [provider]  atorvastatin (LIPITOR) 10 MG tablet Take 10 mg by mouth daily.   Yes [provider]  diclofenac Sodium (EQ ARTHRITIS PAIN) 1 % GEL Apply 1 Application topically 4 (four) times daily as needed (joint pain).   Yes [provider]  gabapentin (NEURONTIN) 300 MG capsule Take 300 mg by mouth 2 (two) times daily. 11/29/20  Yes [provider]  meloxicam (MOBIC) 15 MG tablet Take 1 tablet (15 mg total) by mouth daily as needed for pain. 02/04/23  Yes Kathryne Hitch, MD  pantoprazole (PROTONIX) 40 MG tablet  Take 40 mg by mouth at bedtime.   Yes [provider]  Propylene Glycol (SYSTANE COMPLETE) 0.6 % SOLN Place 1 drop into both eyes as needed (dry eyes).   Yes [provider]  tiZANidine (ZANAFLEX) 4 MG tablet Take 1 tablet (4 mg total) by mouth every 8 (eight) hours as needed for muscle spasms. 10/02/22  Yes Kirtland Bouchard, PA-C  topiramate (TOPAMAX) 200 MG tablet Take 200 mg by mouth daily.   Yes [provider]  traZODone (DESYREL) 150 MG tablet Take 150 mg by mouth at bedtime.   Yes [provider]  valACYclovir (VALTREX) 500 MG tablet Take 500 mg by mouth at bedtime.   Yes [provider]  oxyCODONE (OXY IR/ROXICODONE) 5 MG immediate release tablet Take 1 tablet (5 mg total) by mouth every 6 (six) hours as needed for moderate pain (pain score 4-6). Patient not taking: Reported on 07/15/2023 08/23/22   Kirtland Bouchard, PA-C  promethazine (PHENERGAN) 25 MG tablet Take 1 tablet (25 mg total) by mouth every 6 (six) hours as needed for nausea or vomiting. Patient not taking: Reported on 07/15/2023 04/13/20   Ward, Layla Maw, DO     All other systems have been reviewed and were otherwise negative with the exception of those mentioned in the HPI and as above.  Physical Exam: There were no vitals filed for this visit.  There is no height or weight on file to calculate BMI.  General: Alert, no acute distress Cardiovascular: No pedal edema Respiratory: No cyanosis, no use of accessory musculature Skin: No lesions in the area of chief complaint Neurologic: Sensation intact distally Psychiatric: Patient is competent for consent with normal mood and affect Lymphatic: No axillary or cervical lymphadenopathy   Assessment/Plan: CERVICAL MYELOPATHY DUE TO CORD COMPRESSION AT C3/4 Plan for Procedure(s): ANTERIOR CERVICAL DECOMPRESSION FUSION CERVICAL 3- CERVICAL 4 WITH INSTRUMENTATION AND ALLOGRAFT   Jackelyn Hoehn, MD 07/25/2023 7:33 AM

## 2023-07-25 NOTE — Transfer of Care (Signed)
Immediate Anesthesia Transfer of Care Note  Patient: [name] [facility]  Procedure(s) Performed: ANTERIOR CERVICAL DECOMPRESSION FUSION CERVICAL 3- CERVICAL 4 WITH INSTRUMENTATION AND ALLOGRAFT (Spine Cervical)  Patient Location: PACU  Anesthesia Type:General  Level of Consciousness: drowsy  Airway & Oxygen Therapy: Patient connected to face mask oxygen  Post-op Assessment: Report given to RN  Post vital signs: Reviewed and stable  Last Vitals:  Vitals Value Taken Time  BP 119/61 07/25/23 1245  Temp    Pulse 72 07/25/23 1245  Resp 11 07/25/23 1245  SpO2 99 % 07/25/23 1245  Vitals shown include unfiled device data.  Last Pain:  Vitals:   07/25/23 0824  PainSc: 8       Patients Stated Pain Goal: 0 (07/25/23 0824)  Complications: No notable events documented.

## 2023-07-25 NOTE — Anesthesia Preprocedure Evaluation (Addendum)
Anesthesia Evaluation  Patient identified by MRN, date of birth, ID band Patient awake    Reviewed: Allergy & Precautions, NPO status , Patient's Chart, lab work & pertinent test results  Airway Mallampati: II  TM Distance: >3 FB Neck ROM: Limited  Mouth opening: Limited Mouth Opening  Dental no notable dental hx.    Pulmonary former smoker   Pulmonary exam normal        Cardiovascular negative cardio ROS  Rhythm:Regular Rate:Normal     Neuro/Psych  Headaches  Anxiety Depression       GI/Hepatic Neg liver ROS,GERD  Medicated,,  Endo/Other  negative endocrine ROS    Renal/GU   negative genitourinary   Musculoskeletal  (+) Arthritis , Osteoarthritis,    Abdominal Normal abdominal exam  (+)   Peds  Hematology  (+) Blood dyscrasia, anemia Lab Results      Component                Value               Date                      WBC                      6.9                 07/18/2023                HGB                      12.5                07/18/2023                HCT                      40.2                07/18/2023                MCV                      101.5 (H)           07/18/2023                PLT                      174                 07/18/2023             Lab Results      Component                Value               Date                      NA                       143                 07/18/2023                K  4.5                 07/18/2023                CO2                      22                  07/18/2023                GLUCOSE                  102 (H)             07/18/2023                BUN                      19                  07/18/2023                CREATININE               1.14 (H)            07/18/2023                CALCIUM                  9.6                 07/18/2023                GFRNONAA                 52 (L)              07/18/2023               Anesthesia Other Findings   Reproductive/Obstetrics                             Anesthesia Physical Anesthesia Plan  ASA: 2  Anesthesia Plan: General   Post-op Pain Management: Gabapentin PO (pre-op)*, Tylenol PO (pre-op)* and Celebrex PO (pre-op)*   Induction: Intravenous  PONV Risk Score and Plan: 3 and Ondansetron, Dexamethasone and Treatment may vary due to age or medical condition  Airway Management Planned: Mask, Oral ETT and Video Laryngoscope Planned  Additional Equipment: None  Intra-op Plan:   Post-operative Plan: Extubation in OR  Informed Consent: I have reviewed the patients History and Physical, chart, labs and discussed the procedure including the risks, benefits and alternatives for the proposed anesthesia with the patient or authorized representative who has indicated his/her understanding and acceptance.     Dental advisory given  Plan Discussed with: CRNA  Anesthesia Plan Comments:        Anesthesia Quick Evaluation

## 2023-07-25 NOTE — Anesthesia Postprocedure Evaluation (Signed)
Anesthesia Post Note  Patient: [name]  Procedure(s) Performed: ANTERIOR CERVICAL DECOMPRESSION FUSION CERVICAL 3- CERVICAL 4 WITH INSTRUMENTATION AND ALLOGRAFT (Spine Cervical)     Patient location during evaluation: PACU Anesthesia Type: General Level of consciousness: awake and alert Pain management: pain level controlled Vital Signs Assessment: post-procedure vital signs reviewed and stable Respiratory status: spontaneous breathing, nonlabored ventilation, respiratory function stable and patient connected to nasal cannula oxygen Cardiovascular status: blood pressure returned to baseline and stable Postop Assessment: no apparent nausea or vomiting Anesthetic complications: no   No notable events documented.  Last Vitals:  Vitals:   07/25/23 1300 07/25/23 1315  BP: 137/63 (!) 142/67  Pulse: 71 66  Resp: 11 10  Temp:  (!) 36.2 C  SpO2: 99% 99%    Last Pain:  Vitals:   07/25/23 0824  PainSc: 8                  Neshawn Aird P Tagen Brethauer

## 2023-07-31 ENCOUNTER — Encounter (HOSPITAL_COMMUNITY): Payer: Self-pay | Admitting: Orthopedic Surgery

## 2023-08-05 ENCOUNTER — Ambulatory Visit: Payer: Medicare HMO | Admitting: Orthopaedic Surgery

## 2023-08-28 ENCOUNTER — Encounter: Payer: Self-pay | Admitting: Orthopaedic Surgery

## 2023-08-28 ENCOUNTER — Other Ambulatory Visit (INDEPENDENT_AMBULATORY_CARE_PROVIDER_SITE_OTHER): Payer: Self-pay

## 2023-08-28 ENCOUNTER — Ambulatory Visit: Payer: Medicare HMO | Admitting: Orthopaedic Surgery

## 2023-08-28 ENCOUNTER — Telehealth: Payer: Self-pay

## 2023-08-28 DIAGNOSIS — M25561 Pain in right knee: Secondary | ICD-10-CM | POA: Diagnosis not present

## 2023-08-28 DIAGNOSIS — Z96642 Presence of left artificial hip joint: Secondary | ICD-10-CM | POA: Diagnosis not present

## 2023-08-28 DIAGNOSIS — G8929 Other chronic pain: Secondary | ICD-10-CM

## 2023-08-28 MED ORDER — METHYLPREDNISOLONE ACETATE 40 MG/ML IJ SUSP
40.0000 mg | INTRAMUSCULAR | Status: AC | PRN
Start: 2023-08-28 — End: 2023-08-28
  Administered 2023-08-28: 40 mg via INTRA_ARTICULAR

## 2023-08-28 MED ORDER — LIDOCAINE HCL 1 % IJ SOLN
3.0000 mL | INTRAMUSCULAR | Status: AC | PRN
Start: 2023-08-28 — End: 2023-08-28
  Administered 2023-08-28: 3 mL

## 2023-08-28 NOTE — Telephone Encounter (Signed)
Right knee gel injection  

## 2023-08-28 NOTE — Progress Notes (Signed)
The patient is now a year out from a left total hip arthroplasty through a direct anterior approach.  She had her right hip replaced anteriorly by one of my colleagues sometime before that.  Recently last month she did have cervical spine surgery.  She comes in today saying both of her hips hurt but also her right knee hurts.  She has chronic pain with the right knee.  Both hips move smoothly and fluidly.  Both incisions are healed nicely.  An AP pelvis and lateral of the more recent left operative hip shows bilateral total hip arthroplasties that are well-seated.  Her right knee shows no effusion.  She does have global pain throughout the arc of motion of that knee.  She walks without a limp.  Her leg lengths are equal.  Previous x-rays of the left knee also showed the right knee and show just some slight medial joint space narrowing.  I did offer a steroid injection in her left knee and she agreed to this and tolerated it well.  At this point I do think and feel that she is an appropriate candidate for hyaluronic acid for the right knee so we will order this for her.  She agrees with that treatment plan as well.  This patient is diagnosed with osteoarthritis of the knee(s).    Radiographs show evidence of joint space narrowing, osteophytes, subchondral sclerosis and/or subchondral cysts.  This patient has knee pain which interferes with functional and activities of daily living.    This patient has experienced inadequate response, adverse effects and/or intolerance with conservative treatments such as acetaminophen, NSAIDS, topical creams, physical therapy or regular exercise, knee bracing and/or weight loss.   This patient has experienced inadequate response or has a contraindication to intra articular steroid injections for at least 3 months.   This patient is not scheduled to have a total knee replacement within 6 months of starting treatment with viscosupplementation.      Procedure  Note  Patient: [name]             Date of Birth: [birth year]           MRN: [id number]             Visit Date: 08/28/2023  Procedures: Visit Diagnoses:  1. History of left hip replacement   2. Chronic pain of right knee     Large Joint Inj: R knee on 08/28/2023 11:08 AM Indications: diagnostic evaluation and pain Details: 22 G 1.5 in needle, superolateral approach  Arthrogram: No  Medications: 3 mL lidocaine 1 %; 40 mg methylPREDNISolone acetate 40 MG/ML Outcome: tolerated well, no immediate complications Procedure, treatment alternatives, risks and benefits explained, specific risks discussed. Consent was given by the patient. Immediately prior to procedure a time out was called to verify the correct patient, procedure, equipment, support staff and site/side marked as required. Patient was prepped and draped in the usual sterile fashion.

## 2023-08-30 NOTE — Telephone Encounter (Signed)
VOB submitted Monovisc, right knee

## 2023-11-11 ENCOUNTER — Encounter: Payer: Self-pay | Admitting: Orthopaedic Surgery

## 2023-11-11 ENCOUNTER — Other Ambulatory Visit: Payer: Self-pay

## 2023-11-11 ENCOUNTER — Telehealth: Payer: Self-pay | Admitting: Orthopaedic Surgery

## 2023-11-11 ENCOUNTER — Other Ambulatory Visit: Payer: Self-pay | Admitting: Nurse Practitioner

## 2023-11-11 ENCOUNTER — Ambulatory Visit: Payer: Medicare HMO | Admitting: Orthopaedic Surgery

## 2023-11-11 DIAGNOSIS — M1711 Unilateral primary osteoarthritis, right knee: Secondary | ICD-10-CM

## 2023-11-11 DIAGNOSIS — G8929 Other chronic pain: Secondary | ICD-10-CM

## 2023-11-11 DIAGNOSIS — Z1231 Encounter for screening mammogram for malignant neoplasm of breast: Secondary | ICD-10-CM

## 2023-11-11 DIAGNOSIS — M25561 Pain in right knee: Secondary | ICD-10-CM

## 2023-11-11 MED ORDER — LIDOCAINE HCL 1 % IJ SOLN
5.0000 mL | INTRAMUSCULAR | Status: AC | PRN
Start: 2023-11-11 — End: 2023-11-11
  Administered 2023-11-11: 5 mL

## 2023-11-11 MED ORDER — HYALURONAN 88 MG/4ML IX SOSY
88.0000 mg | PREFILLED_SYRINGE | INTRA_ARTICULAR | Status: AC | PRN
Start: 2023-11-11 — End: 2023-11-11
  Administered 2023-11-11: 88 mg via INTRA_ARTICULAR

## 2023-11-11 NOTE — Progress Notes (Signed)
   Procedure Note  Patient: [name]             Date of Birth: [birth year]           MRN: [id number]             Visit Date: 11/11/2023  Procedures: Visit Diagnoses:  1. Chronic pain of right knee   2. Unilateral primary osteoarthritis, right knee     Large Joint Inj: R knee on 11/11/2023 3:03 PM Indications: diagnostic evaluation and pain Details: 22 G 1.5 in needle, superolateral approach  Arthrogram: No  Medications: 88 mg Hyaluronan 88 MG/4ML; 5 mL lidocaine 1 % Outcome: tolerated well, no immediate complications Procedure, treatment alternatives, risks and benefits explained, specific risks discussed. Consent was given by the patient. Immediately prior to procedure a time out was called to verify the correct patient, procedure, equipment, support staff and site/side marked as required. Patient was prepped and draped in the usual sterile fashion.    The patient is here today for a hyaluronic acid injection in her right knee with Monovisc to treat the pain from osteoarthritis.  She has had no other acute change in medical status.  She understands why we are recommending this injection and performing it given the failure of other conservative treatment measures.  She is walking around well today.  Examination of her right knee shows no effusion.  I was able to place Monovisc in her right knee without difficulty.  I did anesthetize the knee with lidocaine first.  She tolerated the injection well.  She knows to wait at least 6 months between these types of injections but only if she is hurting.  Another option would be knee replacement surgery if this does not help.  She also knows that the injection may take 4 to 6 weeks before she starts to notice a difference.  Lot number: [id number]

## 2023-11-11 NOTE — Telephone Encounter (Signed)
Patient scheduled to have gel injection for right knee

## 2023-11-11 NOTE — Telephone Encounter (Signed)
Patient called. She would like gel injections in her knee. Her call back number is [phone]

## 2023-12-26 ENCOUNTER — Ambulatory Visit
Admission: RE | Admit: 2023-12-26 | Discharge: 2023-12-26 | Disposition: A | Discharge status: Home / Self Care | Payer: Medicare HMO | Source: Ambulatory Visit | Attending: Nurse Practitioner | Admitting: Nurse Practitioner

## 2023-12-26 DIAGNOSIS — Z1231 Encounter for screening mammogram for malignant neoplasm of breast: Secondary | ICD-10-CM

## 2024-01-05 LAB — EXTERNAL GENERIC LAB PROCEDURE

## 2024-01-05 LAB — COLOGUARD

## 2024-04-01 ENCOUNTER — Other Ambulatory Visit: Payer: Self-pay | Admitting: Orthopaedic Surgery

## 2024-04-01 ENCOUNTER — Telehealth: Payer: Self-pay | Admitting: Orthopaedic Surgery

## 2024-04-01 MED ORDER — MELOXICAM 15 MG PO TABS: 15.0000 mg | ORAL_TABLET | Freq: Every day | ORAL | 3 refills | Status: AC | PRN

## 2024-04-01 NOTE — Telephone Encounter (Signed)
 Patient called and ask if she could get some meloxicam for her hip. [id number]#[phone]

## 2024-04-01 NOTE — Telephone Encounter (Signed)
 Patient called and ask that you can send the meloxicam to [facility] on [address].[phone]

## 2024-04-02 ENCOUNTER — Telehealth: Payer: Self-pay | Admitting: Physician Assistant

## 2024-04-02 ENCOUNTER — Telehealth: Payer: Self-pay | Admitting: Orthopedic Surgery

## 2024-04-02 NOTE — Telephone Encounter (Signed)
 Patient called in and said she has a appointment but she was upset and said she don't have mychart and she never want to know nobody name and don't ever want to see this place again.

## 2024-04-02 NOTE — Telephone Encounter (Signed)
 Left voice mail

## 2024-04-02 NOTE — Telephone Encounter (Signed)
 Ms. Leighton Roach came in to the office this afternoon.  I am unsure of the reason for her visit.  I was called to the front desk to help by [employer].  As soon as I walked up front, Ms. Ellender recognized me because I had attempted to help her before.  She immediately began yelling at me and telling me that I "put her on MyChart".  She then said I needed to take her back to my office where everyone cannot hear her business.  She continued to yell at me and accused me of calling her a liar.  I pulled up her account in [employer] to show her the MyChart status.  She decided to yell and shout things about how terrible Dr. Magnus Ivan was and how he ruined her hip and she pulled her pants down to show me the "bruise" she says that Dr. Magnus Ivan.  I was able to deactivate her myChart.  I attempted to show her this, but she chose to continue yelling and stepping behind my back at my desk and telling me how mad she was.  Then she shut my door because she did not want people to hear her speaking to me.  I told her at that point that I did not feel safe in the room with her.  She said "I'm done with this office" and walked out.

## 2024-04-13 ENCOUNTER — Ambulatory Visit: Admitting: Physician Assistant

## 2024-09-21 ENCOUNTER — Telehealth: Payer: Self-pay | Admitting: Physician Assistant

## 2024-09-21 NOTE — Telephone Encounter (Signed)
 FYI  Copied from CRM [id number]. Topic: Appointments - Transfer of Care >> Sep 21, 2024  3:48 PM Frederich PARAS wrote: Pt is requesting to transfer FROM: [facility]  Pt is requesting to transfer TO: [facility]  Reason for requested transfer: pt needs new provider  It is the responsibility of the team the patient would like to transfer to (Dr. Philippe Slade ) to reach out to the patient if for any reason this transfer is not acceptable.

## 2024-10-26 ENCOUNTER — Encounter: Payer: Self-pay | Admitting: Radiology

## 2024-11-09 ENCOUNTER — Ambulatory Visit: Admitting: Family Medicine

## 2024-11-12 ENCOUNTER — Telehealth: Payer: Self-pay | Admitting: General Practice

## 2024-11-12 NOTE — Telephone Encounter (Unsigned)
 Copied from CRM #[id number]. Topic: Clinical - Refused Triage >> Nov 12, 2024 12:24 PM Alexandria E wrote: Patient/caller voiced complaints of recurring migraine. Patient did start crying on the phone, as she is going through a lot of personal issues. Her cat died about a month and patient is not handling it well. Crying makes her migraine worse. Patient repeatedly told agent I'm fine, I'm fine, I do not need a nurse. 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 Declined transfer to triage.

## 2024-12-08 ENCOUNTER — Ambulatory Visit: Admitting: Adult Health

## 2024-12-08 ENCOUNTER — Encounter: Payer: Self-pay | Admitting: Adult Health

## 2024-12-08 VITALS — BP 100/60 | Temp 99.0°F | Ht <= 58 in | Wt 109.0 lb

## 2024-12-08 DIAGNOSIS — N1831 Chronic kidney disease, stage 3a: Secondary | ICD-10-CM | POA: Diagnosis not present

## 2024-12-08 DIAGNOSIS — E559 Vitamin D deficiency, unspecified: Secondary | ICD-10-CM | POA: Diagnosis not present

## 2024-12-08 DIAGNOSIS — B977 Papillomavirus as the cause of diseases classified elsewhere: Secondary | ICD-10-CM | POA: Diagnosis not present

## 2024-12-08 DIAGNOSIS — F5104 Psychophysiologic insomnia: Secondary | ICD-10-CM | POA: Diagnosis not present

## 2024-12-08 DIAGNOSIS — G8929 Other chronic pain: Secondary | ICD-10-CM

## 2024-12-08 DIAGNOSIS — M542 Cervicalgia: Secondary | ICD-10-CM | POA: Diagnosis not present

## 2024-12-08 DIAGNOSIS — G43819 Other migraine, intractable, without status migrainosus: Secondary | ICD-10-CM | POA: Diagnosis not present

## 2024-12-08 DIAGNOSIS — Z7689 Persons encountering health services in other specified circumstances: Secondary | ICD-10-CM

## 2024-12-08 DIAGNOSIS — E785 Hyperlipidemia, unspecified: Secondary | ICD-10-CM | POA: Diagnosis not present

## 2024-12-08 DIAGNOSIS — K219 Gastro-esophageal reflux disease without esophagitis: Secondary | ICD-10-CM

## 2024-12-08 LAB — BASIC METABOLIC PANEL WITH GFR
BUN: 18 mg/dL (ref 6–23)
CO2: 26 meq/L (ref 19–32)
Calcium: 9.2 mg/dL (ref 8.4–10.5)
Chloride: 108 meq/L (ref 96–112)
Creatinine, Ser: 1.1 mg/dL (ref 0.40–1.20)
GFR: 51.01 mL/min — ABNORMAL LOW (ref >60.00)
Glucose, Bld: 93 mg/dL (ref 70–99)
Potassium: 4.5 meq/L (ref 3.5–5.1)
Sodium: 141 meq/L (ref 135–145)

## 2024-12-08 LAB — VITAMIN D 25 HYDROXY (VIT D DEFICIENCY, FRACTURES): VITD: 81.46 ng/mL (ref 30.00–100.00)

## 2024-12-08 MED ORDER — TOPIRAMATE 200 MG PO TABS: 200.0000 mg | ORAL_TABLET | Freq: Every day | ORAL | 0 refills | Status: DC

## 2024-12-08 NOTE — Patient Instructions (Signed)
 I am going to check your Vitamin D  and renal function today   I have refilled your Topamax  200 mg daily.   Please follow up for your annual exam
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
## 2024-12-09 ENCOUNTER — Ambulatory Visit: Payer: Self-pay | Admitting: Adult Health

## 2024-12-09 ENCOUNTER — Other Ambulatory Visit: Payer: Self-pay

## 2024-12-09 ENCOUNTER — Telehealth: Payer: Self-pay

## 2024-12-09 DIAGNOSIS — G43819 Other migraine, intractable, without status migrainosus: Secondary | ICD-10-CM

## 2024-12-09 MED ORDER — TOPIRAMATE 200 MG PO TABS: 200.0000 mg | ORAL_TABLET | Freq: Every day | ORAL | 0 refills | Status: AC

## 2024-12-09 NOTE — Telephone Encounter (Signed)
 Copied from CRM #[id number]. Topic: Clinical - Prescription Issue >> Dec 09, 2024  9:48 AM Charolett L wrote: Reason for CRM: patient stated that her medication  topiramate  (TOPAMAX ) 200 MG tablet was sent to the wrong pharmacy. She stated that it needs to be sent to the pharmacy listed below   [id number] [address] [place], [place] - [address] [phone] [address] [place] [address]

## 2024-12-09 NOTE — Telephone Encounter (Signed)
 Medication sent into correct pharmacy

## 2024-12-09 NOTE — Telephone Encounter (Signed)
 Tried to call the patient , voicemail was not set up.

## 2024-12-18 ENCOUNTER — Telehealth: Payer: Self-pay | Admitting: *Deleted

## 2024-12-18 NOTE — Telephone Encounter (Signed)
 Copied from CRM #[id number]. Topic: Clinical - Request for Lab/Test Order >> Dec 18, 2024 11:21 AM [name] wrote: Reason for CRM: Patient would like labs performed before Physical

## 2024-12-22 ENCOUNTER — Other Ambulatory Visit

## 2024-12-22 NOTE — Telephone Encounter (Signed)
 Pt was notified that labs will be done the day of CPE. No further action needed.

## 2024-12-31 ENCOUNTER — Ambulatory Visit (INDEPENDENT_AMBULATORY_CARE_PROVIDER_SITE_OTHER): Admitting: Adult Health

## 2024-12-31 ENCOUNTER — Encounter: Payer: Self-pay | Admitting: Adult Health

## 2024-12-31 ENCOUNTER — Ambulatory Visit: Payer: Self-pay | Admitting: Adult Health

## 2024-12-31 VITALS — BP 120/64 | HR 75 | Temp 98.5°F | Ht <= 58 in | Wt 105.0 lb

## 2024-12-31 DIAGNOSIS — B977 Papillomavirus as the cause of diseases classified elsewhere: Secondary | ICD-10-CM

## 2024-12-31 DIAGNOSIS — G8929 Other chronic pain: Secondary | ICD-10-CM

## 2024-12-31 DIAGNOSIS — M542 Cervicalgia: Secondary | ICD-10-CM

## 2024-12-31 DIAGNOSIS — Z0000 Encounter for general adult medical examination without abnormal findings: Secondary | ICD-10-CM | POA: Diagnosis not present

## 2024-12-31 DIAGNOSIS — F411 Generalized anxiety disorder: Secondary | ICD-10-CM | POA: Diagnosis not present

## 2024-12-31 DIAGNOSIS — Z1211 Encounter for screening for malignant neoplasm of colon: Secondary | ICD-10-CM

## 2024-12-31 DIAGNOSIS — F3341 Major depressive disorder, recurrent, in partial remission: Secondary | ICD-10-CM | POA: Diagnosis not present

## 2024-12-31 DIAGNOSIS — F5104 Psychophysiologic insomnia: Secondary | ICD-10-CM | POA: Diagnosis not present

## 2024-12-31 DIAGNOSIS — K219 Gastro-esophageal reflux disease without esophagitis: Secondary | ICD-10-CM

## 2024-12-31 DIAGNOSIS — G43711 Chronic migraine without aura, intractable, with status migrainosus: Secondary | ICD-10-CM | POA: Diagnosis not present

## 2024-12-31 DIAGNOSIS — N1831 Chronic kidney disease, stage 3a: Secondary | ICD-10-CM

## 2024-12-31 LAB — CBC WITH DIFFERENTIAL/PLATELET
Basophils Absolute: 0.1 K/uL (ref 0.0–0.1)
Basophils Relative: 1.1 % (ref 0.0–3.0)
Eosinophils Absolute: 0.3 K/uL (ref 0.0–0.7)
Eosinophils Relative: 4.1 % (ref 0.0–5.0)
HCT: 40.1 % (ref 36.0–46.0)
Hemoglobin: 13.3 g/dL (ref 12.0–15.0)
Lymphocytes Relative: 27.9 % (ref 12.0–46.0)
Lymphs Abs: 1.8 K/uL (ref 0.7–4.0)
MCHC: 33 g/dL (ref 30.0–36.0)
MCV: 103.6 fl — ABNORMAL HIGH (ref 78.0–100.0)
Monocytes Absolute: 0.6 K/uL (ref 0.1–1.0)
Monocytes Relative: 9.5 % (ref 3.0–12.0)
Neutro Abs: 3.7 K/uL (ref 1.4–7.7)
Neutrophils Relative %: 57.4 % (ref 43.0–77.0)
Platelets: 121 K/uL — ABNORMAL LOW (ref 150.0–400.0)
RBC: 3.87 Mil/uL (ref 3.87–5.11)
RDW: 13.2 % (ref 11.5–15.5)
WBC: 6.4 K/uL (ref 4.0–10.5)

## 2024-12-31 LAB — TSH: TSH: 0.37 u[IU]/mL (ref 0.35–5.50)

## 2024-12-31 LAB — COMPREHENSIVE METABOLIC PANEL WITH GFR
ALT: 10 U/L (ref 3–35)
AST: 17 U/L (ref 5–37)
Albumin: 4.6 g/dL (ref 3.5–5.2)
Alkaline Phosphatase: 58 U/L (ref 39–117)
BUN: 16 mg/dL (ref 6–23)
CO2: 24 meq/L (ref 19–32)
Calcium: 9.6 mg/dL (ref 8.4–10.5)
Chloride: 112 meq/L (ref 96–112)
Creatinine, Ser: 1.02 mg/dL (ref 0.40–1.20)
GFR: 55.82 mL/min — ABNORMAL LOW
Glucose, Bld: 92 mg/dL (ref 70–99)
Potassium: 4.2 meq/L (ref 3.5–5.1)
Sodium: 144 meq/L (ref 135–145)
Total Bilirubin: 0.6 mg/dL (ref 0.2–1.2)
Total Protein: 7 g/dL (ref 6.0–8.3)

## 2024-12-31 LAB — LIPID PANEL
Cholesterol: 184 mg/dL (ref 28–200)
HDL: 87.3 mg/dL
LDL Cholesterol: 79 mg/dL (ref 10–99)
NonHDL: 97.15
Total CHOL/HDL Ratio: 2
Triglycerides: 91 mg/dL (ref 10.0–149.0)
VLDL: 18.2 mg/dL (ref 0.0–40.0)

## 2024-12-31 NOTE — Patient Instructions (Signed)
 It was great seeing you today   We will follow up with you regarding your lab work   Please let me know if you need anything
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
## 2025-01-12 ENCOUNTER — Other Ambulatory Visit: Payer: Self-pay | Admitting: Adult Health

## 2025-01-13 ENCOUNTER — Other Ambulatory Visit: Payer: Self-pay | Admitting: Adult Health

## 2025-01-14 LAB — COLOGUARD

## 2025-01-21 ENCOUNTER — Telehealth: Payer: Self-pay | Admitting: Adult Health
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
## 2025-01-21 NOTE — Telephone Encounter (Signed)
 Pt notified that prescription was ready for pick up at pharmacy since 01/13/2025. Pt will call pharmacy.

## 2025-01-21 NOTE — Telephone Encounter (Signed)
 Gabapentin  was already refilled. Will call pt to verify pharmacy.

## 2025-01-26 ENCOUNTER — Other Ambulatory Visit: Payer: Self-pay

## 2025-01-26 ENCOUNTER — Telehealth: Payer: Self-pay | Admitting: Adult Health

## 2025-01-26 NOTE — Telephone Encounter (Signed)
 Left message to return phone call.
